# Patient Record
Sex: Female | Born: 1975 | Race: White | Hispanic: No | Marital: Married | Smoking: Never smoker
Health system: Southern US, Community
[De-identification: ages and names within clinical notes are randomized; demographics above are authoritative.]

## PROBLEM LIST (undated history)

## (undated) DIAGNOSIS — I10 Essential (primary) hypertension: Secondary | ICD-10-CM

## (undated) DIAGNOSIS — J349 Unspecified disorder of nose and nasal sinuses: Secondary | ICD-10-CM

## (undated) DIAGNOSIS — R51 Headache: Secondary | ICD-10-CM

## (undated) DIAGNOSIS — IMO0002 Reserved for concepts with insufficient information to code with codable children: Secondary | ICD-10-CM

## (undated) DIAGNOSIS — D649 Anemia, unspecified: Secondary | ICD-10-CM

## (undated) DIAGNOSIS — N12 Tubulo-interstitial nephritis, not specified as acute or chronic: Secondary | ICD-10-CM

## (undated) DIAGNOSIS — R87619 Unspecified abnormal cytological findings in specimens from cervix uteri: Secondary | ICD-10-CM

## (undated) DIAGNOSIS — K219 Gastro-esophageal reflux disease without esophagitis: Secondary | ICD-10-CM

## (undated) DIAGNOSIS — J45909 Unspecified asthma, uncomplicated: Secondary | ICD-10-CM

## (undated) DIAGNOSIS — F319 Bipolar disorder, unspecified: Secondary | ICD-10-CM

## (undated) DIAGNOSIS — E079 Disorder of thyroid, unspecified: Secondary | ICD-10-CM

## (undated) HISTORY — DX: Tubulo-interstitial nephritis, not specified as acute or chronic: N12

## (undated) HISTORY — PX: WISDOM TOOTH EXTRACTION: SHX21

## (undated) HISTORY — DX: Disorder of thyroid, unspecified: E07.9

## (undated) HISTORY — PX: COLPOSCOPY: SHX161

## (undated) HISTORY — DX: Unspecified asthma, uncomplicated: J45.909

## (undated) HISTORY — DX: Gastro-esophageal reflux disease without esophagitis: K21.9

## (undated) HISTORY — DX: Essential (primary) hypertension: I10

## (undated) HISTORY — PX: KNEE ARTHROSCOPY: SHX127

## (undated) SURGERY — Surgical Case
Anesthesia: *Unknown

---

## 1997-12-30 ENCOUNTER — Other Ambulatory Visit: Admission: RE | Admit: 1997-12-30 | Discharge: 1997-12-30 | Payer: Self-pay | Admitting: *Deleted

## 1998-12-22 ENCOUNTER — Other Ambulatory Visit: Admission: RE | Admit: 1998-12-22 | Discharge: 1998-12-22 | Payer: Self-pay | Admitting: *Deleted

## 1999-12-26 ENCOUNTER — Other Ambulatory Visit: Admission: RE | Admit: 1999-12-26 | Discharge: 1999-12-26 | Payer: Self-pay | Admitting: *Deleted

## 2000-01-02 ENCOUNTER — Encounter: Payer: Self-pay | Admitting: *Deleted

## 2000-01-02 ENCOUNTER — Encounter: Admission: RE | Admit: 2000-01-02 | Discharge: 2000-01-02 | Payer: Self-pay | Admitting: *Deleted

## 2001-03-16 ENCOUNTER — Other Ambulatory Visit: Admission: RE | Admit: 2001-03-16 | Discharge: 2001-03-16 | Payer: Self-pay | Admitting: Obstetrics and Gynecology

## 2002-03-26 ENCOUNTER — Other Ambulatory Visit: Admission: RE | Admit: 2002-03-26 | Discharge: 2002-03-26 | Payer: Self-pay | Admitting: Obstetrics and Gynecology

## 2002-04-23 ENCOUNTER — Other Ambulatory Visit: Admission: RE | Admit: 2002-04-23 | Discharge: 2002-04-23 | Payer: Self-pay | Admitting: Obstetrics and Gynecology

## 2003-04-04 ENCOUNTER — Other Ambulatory Visit: Admission: RE | Admit: 2003-04-04 | Discharge: 2003-04-04 | Payer: Self-pay | Admitting: Obstetrics and Gynecology

## 2004-09-26 ENCOUNTER — Ambulatory Visit: Payer: Self-pay | Admitting: Internal Medicine

## 2006-06-17 ENCOUNTER — Ambulatory Visit: Payer: Self-pay | Admitting: Internal Medicine

## 2006-07-08 DIAGNOSIS — N12 Tubulo-interstitial nephritis, not specified as acute or chronic: Secondary | ICD-10-CM

## 2006-07-08 HISTORY — DX: Tubulo-interstitial nephritis, not specified as acute or chronic: N12

## 2007-03-29 ENCOUNTER — Inpatient Hospital Stay (HOSPITAL_COMMUNITY): Admission: EM | Admit: 2007-03-29 | Discharge: 2007-03-31 | Payer: Self-pay | Admitting: Emergency Medicine

## 2007-09-21 ENCOUNTER — Encounter: Admission: RE | Admit: 2007-09-21 | Discharge: 2007-09-21 | Payer: Self-pay | Admitting: Internal Medicine

## 2007-12-02 ENCOUNTER — Ambulatory Visit (HOSPITAL_COMMUNITY): Admission: RE | Admit: 2007-12-02 | Discharge: 2007-12-02 | Payer: Self-pay | Admitting: Obstetrics

## 2008-01-21 ENCOUNTER — Ambulatory Visit: Payer: Self-pay | Admitting: Cardiovascular Disease

## 2008-01-22 ENCOUNTER — Ambulatory Visit (HOSPITAL_COMMUNITY): Admission: RE | Admit: 2008-01-22 | Discharge: 2008-01-22 | Payer: Self-pay | Admitting: Obstetrics

## 2008-02-11 ENCOUNTER — Ambulatory Visit (HOSPITAL_COMMUNITY): Admission: RE | Admit: 2008-02-11 | Discharge: 2008-02-11 | Payer: Self-pay | Admitting: Obstetrics

## 2008-02-22 ENCOUNTER — Ambulatory Visit (HOSPITAL_COMMUNITY): Admission: RE | Admit: 2008-02-22 | Discharge: 2008-02-22 | Payer: Self-pay | Admitting: Obstetrics

## 2008-03-18 ENCOUNTER — Ambulatory Visit: Payer: Self-pay | Admitting: Internal Medicine

## 2008-03-21 ENCOUNTER — Ambulatory Visit (HOSPITAL_COMMUNITY): Admission: RE | Admit: 2008-03-21 | Discharge: 2008-03-21 | Payer: Self-pay | Admitting: Obstetrics

## 2008-04-18 ENCOUNTER — Ambulatory Visit (HOSPITAL_COMMUNITY): Admission: RE | Admit: 2008-04-18 | Discharge: 2008-04-18 | Payer: Self-pay | Admitting: Obstetrics

## 2008-05-11 ENCOUNTER — Ambulatory Visit: Payer: Self-pay | Admitting: Internal Medicine

## 2008-05-16 ENCOUNTER — Ambulatory Visit (HOSPITAL_COMMUNITY): Admission: RE | Admit: 2008-05-16 | Discharge: 2008-05-16 | Payer: Self-pay | Admitting: Obstetrics

## 2008-06-08 ENCOUNTER — Inpatient Hospital Stay (HOSPITAL_COMMUNITY): Admission: AD | Admit: 2008-06-08 | Discharge: 2008-06-11 | Payer: Self-pay | Admitting: Obstetrics

## 2008-06-08 ENCOUNTER — Ambulatory Visit: Payer: Self-pay | Admitting: Cardiology

## 2008-06-08 ENCOUNTER — Encounter (INDEPENDENT_AMBULATORY_CARE_PROVIDER_SITE_OTHER): Payer: Self-pay | Admitting: Obstetrics and Gynecology

## 2008-06-30 ENCOUNTER — Ambulatory Visit: Payer: Self-pay | Admitting: Internal Medicine

## 2008-08-17 ENCOUNTER — Encounter (INDEPENDENT_AMBULATORY_CARE_PROVIDER_SITE_OTHER): Payer: Self-pay | Admitting: *Deleted

## 2008-08-17 ENCOUNTER — Ambulatory Visit: Payer: Self-pay | Admitting: Family Medicine

## 2008-10-03 ENCOUNTER — Ambulatory Visit: Payer: Self-pay | Admitting: Family Medicine

## 2008-10-03 DIAGNOSIS — D485 Neoplasm of uncertain behavior of skin: Secondary | ICD-10-CM | POA: Insufficient documentation

## 2008-10-05 ENCOUNTER — Encounter (INDEPENDENT_AMBULATORY_CARE_PROVIDER_SITE_OTHER): Payer: Self-pay | Admitting: *Deleted

## 2008-10-05 LAB — CONVERTED CEMR LAB
ALT: 12 units/L (ref 0–35)
AST: 19 units/L (ref 0–37)
Albumin: 4.1 g/dL (ref 3.5–5.2)
Alkaline Phosphatase: 68 units/L (ref 39–117)
BUN: 10 mg/dL (ref 6–23)
Basophils Absolute: 0 10*3/uL (ref 0.0–0.1)
Basophils Relative: 0.1 % (ref 0.0–3.0)
Bilirubin, Direct: 0 mg/dL (ref 0.0–0.3)
CO2: 32 meq/L (ref 19–32)
Calcium: 9.3 mg/dL (ref 8.4–10.5)
Chloride: 106 meq/L (ref 96–112)
Cholesterol: 156 mg/dL (ref 0–200)
Creatinine, Ser: 0.7 mg/dL (ref 0.4–1.2)
Eosinophils Absolute: 0.1 10*3/uL (ref 0.0–0.7)
Eosinophils Relative: 2.3 % (ref 0.0–5.0)
GFR calc non Af Amer: 103.4 mL/min (ref >60)
Glucose, Bld: 81 mg/dL (ref 70–99)
HCT: 39.2 % (ref 36.0–46.0)
HDL: 59.2 mg/dL (ref >39.00)
Hemoglobin: 13.8 g/dL (ref 12.0–15.0)
LDL Cholesterol: 88 mg/dL (ref 0–99)
Lymphocytes Relative: 32.1 % (ref 12.0–46.0)
Lymphs Abs: 1.8 10*3/uL (ref 0.7–4.0)
MCHC: 35.1 g/dL (ref 30.0–36.0)
MCV: 90.2 fL (ref 78.0–100.0)
Monocytes Absolute: 0.5 10*3/uL (ref 0.1–1.0)
Monocytes Relative: 9 % (ref 3.0–12.0)
Neutro Abs: 3.1 10*3/uL (ref 1.4–7.7)
Neutrophils Relative %: 56.5 % (ref 43.0–77.0)
Platelets: 198 10*3/uL (ref 150.0–400.0)
Potassium: 4.1 meq/L (ref 3.5–5.1)
RBC: 4.35 M/uL (ref 3.87–5.11)
RDW: 12.5 % (ref 11.5–14.6)
Sodium: 142 meq/L (ref 135–145)
TSH: 1.12 microintl units/mL (ref 0.35–5.50)
Total Bilirubin: 0.5 mg/dL (ref 0.3–1.2)
Total CHOL/HDL Ratio: 3
Total Protein: 7 g/dL (ref 6.0–8.3)
Triglycerides: 42 mg/dL (ref 0.0–149.0)
VLDL: 8.4 mg/dL (ref 0.0–40.0)
WBC: 5.5 10*3/uL (ref 4.5–10.5)

## 2008-11-16 ENCOUNTER — Ambulatory Visit: Payer: Self-pay | Admitting: Family Medicine

## 2008-11-16 LAB — CONVERTED CEMR LAB: Rapid Strep: NEGATIVE

## 2009-04-20 ENCOUNTER — Ambulatory Visit: Payer: Self-pay | Admitting: Family Medicine

## 2009-05-26 ENCOUNTER — Ambulatory Visit: Payer: Self-pay | Admitting: Internal Medicine

## 2009-06-17 ENCOUNTER — Emergency Department (HOSPITAL_BASED_OUTPATIENT_CLINIC_OR_DEPARTMENT_OTHER): Admission: EM | Admit: 2009-06-17 | Discharge: 2009-06-17 | Payer: Self-pay | Admitting: Emergency Medicine

## 2009-07-21 ENCOUNTER — Ambulatory Visit: Payer: Self-pay | Admitting: Internal Medicine

## 2009-08-30 ENCOUNTER — Telehealth: Payer: Self-pay | Admitting: Internal Medicine

## 2009-09-28 ENCOUNTER — Ambulatory Visit: Payer: Self-pay | Admitting: Family Medicine

## 2009-09-28 ENCOUNTER — Telehealth: Payer: Self-pay | Admitting: Internal Medicine

## 2009-10-31 ENCOUNTER — Encounter: Payer: Self-pay | Admitting: Internal Medicine

## 2009-11-10 ENCOUNTER — Telehealth (INDEPENDENT_AMBULATORY_CARE_PROVIDER_SITE_OTHER): Payer: Self-pay | Admitting: *Deleted

## 2009-11-20 ENCOUNTER — Encounter: Payer: Self-pay | Admitting: Internal Medicine

## 2009-11-21 ENCOUNTER — Encounter: Payer: Self-pay | Admitting: Internal Medicine

## 2009-12-01 ENCOUNTER — Encounter: Payer: Self-pay | Admitting: Internal Medicine

## 2009-12-21 ENCOUNTER — Encounter: Payer: Self-pay | Admitting: Internal Medicine

## 2009-12-26 ENCOUNTER — Ambulatory Visit: Payer: Self-pay | Admitting: Family Medicine

## 2009-12-26 DIAGNOSIS — M461 Sacroiliitis, not elsewhere classified: Secondary | ICD-10-CM | POA: Insufficient documentation

## 2010-01-23 ENCOUNTER — Encounter (INDEPENDENT_AMBULATORY_CARE_PROVIDER_SITE_OTHER): Payer: Self-pay | Admitting: *Deleted

## 2010-02-09 ENCOUNTER — Telehealth (INDEPENDENT_AMBULATORY_CARE_PROVIDER_SITE_OTHER): Payer: Self-pay | Admitting: *Deleted

## 2010-02-09 ENCOUNTER — Ambulatory Visit: Payer: Self-pay | Admitting: Family Medicine

## 2010-02-09 ENCOUNTER — Encounter: Admission: RE | Admit: 2010-02-09 | Discharge: 2010-02-09 | Payer: Self-pay | Admitting: Family Medicine

## 2010-03-02 ENCOUNTER — Ambulatory Visit: Payer: Self-pay | Admitting: Family Medicine

## 2010-03-02 DIAGNOSIS — M545 Low back pain, unspecified: Secondary | ICD-10-CM | POA: Insufficient documentation

## 2010-03-19 ENCOUNTER — Encounter
Admission: RE | Admit: 2010-03-19 | Discharge: 2010-06-17 | Payer: Self-pay | Source: Home / Self Care | Attending: Family Medicine | Admitting: Family Medicine

## 2010-03-21 ENCOUNTER — Emergency Department (HOSPITAL_BASED_OUTPATIENT_CLINIC_OR_DEPARTMENT_OTHER): Admission: EM | Admit: 2010-03-21 | Discharge: 2010-03-21 | Payer: Self-pay | Admitting: Emergency Medicine

## 2010-03-21 ENCOUNTER — Ambulatory Visit: Payer: Self-pay | Admitting: Diagnostic Radiology

## 2010-03-29 ENCOUNTER — Encounter: Payer: Self-pay | Admitting: Internal Medicine

## 2010-04-02 ENCOUNTER — Ambulatory Visit: Payer: Self-pay | Admitting: Family Medicine

## 2010-05-07 ENCOUNTER — Ambulatory Visit: Payer: Self-pay | Admitting: Family Medicine

## 2010-05-07 DIAGNOSIS — R42 Dizziness and giddiness: Secondary | ICD-10-CM | POA: Insufficient documentation

## 2010-05-14 ENCOUNTER — Ambulatory Visit: Payer: Self-pay | Admitting: Family Medicine

## 2010-05-14 LAB — CONVERTED CEMR LAB
Bilirubin Urine: NEGATIVE
Glucose, Urine, Semiquant: NEGATIVE
Ketones, urine, test strip: NEGATIVE
Nitrite: POSITIVE
Protein, U semiquant: NEGATIVE
Specific Gravity, Urine: 1.02
Urobilinogen, UA: 0.2
pH: 6.5

## 2010-05-15 ENCOUNTER — Telehealth (INDEPENDENT_AMBULATORY_CARE_PROVIDER_SITE_OTHER): Payer: Self-pay | Admitting: *Deleted

## 2010-05-15 LAB — CONVERTED CEMR LAB: hCG, Beta Chain, Quant, S: 0.35 milliintl units/mL

## 2010-05-18 ENCOUNTER — Telehealth: Payer: Self-pay | Admitting: Family Medicine

## 2010-06-05 ENCOUNTER — Encounter: Payer: Self-pay | Admitting: Sports Medicine

## 2010-06-05 ENCOUNTER — Encounter: Payer: Self-pay | Admitting: Family Medicine

## 2010-06-09 ENCOUNTER — Emergency Department (HOSPITAL_BASED_OUTPATIENT_CLINIC_OR_DEPARTMENT_OTHER): Admission: EM | Admit: 2010-06-09 | Discharge: 2010-06-09 | Payer: Self-pay | Admitting: Emergency Medicine

## 2010-06-18 ENCOUNTER — Ambulatory Visit: Payer: Self-pay | Admitting: Family Medicine

## 2010-06-25 ENCOUNTER — Ambulatory Visit: Payer: Self-pay | Admitting: Family Medicine

## 2010-06-25 ENCOUNTER — Ambulatory Visit (HOSPITAL_BASED_OUTPATIENT_CLINIC_OR_DEPARTMENT_OTHER)
Admission: RE | Admit: 2010-06-25 | Discharge: 2010-06-25 | Payer: Self-pay | Source: Home / Self Care | Attending: Family Medicine | Admitting: Family Medicine

## 2010-06-25 ENCOUNTER — Encounter: Payer: Self-pay | Admitting: Family Medicine

## 2010-06-25 DIAGNOSIS — IMO0001 Reserved for inherently not codable concepts without codable children: Secondary | ICD-10-CM | POA: Insufficient documentation

## 2010-06-25 LAB — CONVERTED CEMR LAB
Anti Nuclear Antibody(ANA): NEGATIVE
Basophils Absolute: 0 10*3/uL (ref 0.0–0.1)
Basophils Relative: 1 % (ref 0.0–3.0)
Eosinophils Absolute: 0.1 10*3/uL (ref 0.0–0.7)
Eosinophils Relative: 2.3 % (ref 0.0–5.0)
HCT: 40.4 % (ref 36.0–46.0)
Hemoglobin: 13.8 g/dL (ref 12.0–15.0)
Lymphocytes Relative: 35.5 % (ref 12.0–46.0)
Lymphs Abs: 1.8 10*3/uL (ref 0.7–4.0)
MCHC: 34.2 g/dL (ref 30.0–36.0)
MCV: 91.2 fL (ref 78.0–100.0)
Monocytes Absolute: 0.5 10*3/uL (ref 0.1–1.0)
Monocytes Relative: 10.5 % (ref 3.0–12.0)
Neutro Abs: 2.5 10*3/uL (ref 1.4–7.7)
Neutrophils Relative %: 50.7 % (ref 43.0–77.0)
Platelets: 208 10*3/uL (ref 150.0–400.0)
RBC: 4.44 M/uL (ref 3.87–5.11)
RDW: 13 % (ref 11.5–14.6)
Sed Rate: 9 mm/hr (ref 0–22)
TSH: 1.17 microintl units/mL (ref 0.35–5.50)
WBC: 5 10*3/uL (ref 4.5–10.5)

## 2010-07-10 ENCOUNTER — Ambulatory Visit
Admission: RE | Admit: 2010-07-10 | Discharge: 2010-07-10 | Payer: Self-pay | Source: Home / Self Care | Attending: Family Medicine | Admitting: Family Medicine

## 2010-07-10 ENCOUNTER — Encounter: Payer: Self-pay | Admitting: Family Medicine

## 2010-07-16 ENCOUNTER — Ambulatory Visit
Admission: RE | Admit: 2010-07-16 | Discharge: 2010-07-16 | Payer: Self-pay | Source: Home / Self Care | Attending: Family Medicine | Admitting: Family Medicine

## 2010-07-16 DIAGNOSIS — H6690 Otitis media, unspecified, unspecified ear: Secondary | ICD-10-CM | POA: Insufficient documentation

## 2010-07-16 DIAGNOSIS — J309 Allergic rhinitis, unspecified: Secondary | ICD-10-CM | POA: Insufficient documentation

## 2010-07-29 ENCOUNTER — Encounter: Payer: Self-pay | Admitting: Obstetrics

## 2010-08-03 ENCOUNTER — Ambulatory Visit
Admission: RE | Admit: 2010-08-03 | Discharge: 2010-08-03 | Payer: Self-pay | Source: Home / Self Care | Attending: Family Medicine | Admitting: Family Medicine

## 2010-08-09 NOTE — Miscellaneous (Signed)
Summary: Orders Update   Clinical Lists Changes  Problems: Added new problem of EPICONDYLITIS (ICD-726.32) Orders: Added new Referral order of Orthopedic Referral (Ortho) - Signed

## 2010-08-09 NOTE — Letter (Signed)
Summary: [facility]   Imported By: Roderic Ovens 11/20/2009 13:54:24  _____________________________________________________________________  External Attachment:    Type:   Image     Comment:   External Document

## 2010-08-09 NOTE — Assessment & Plan Note (Signed)
Summary: bladder infection/kn  Flu Vaccine Consent Questions     Do you have a history of severe allergic reactions to this vaccine? no    Any prior history of allergic reactions to egg and/or gelatin? no    Do you have a sensitivity to the preservative Thimersol? no    Do you have a past history of Guillan-Barre Syndrome? no    Do you currently have an acute febrile illness? no    Have you ever had a severe reaction to latex? no    Vaccine information given and explained to patient? yes    Are you currently pregnant? no    Lot Number:[id number]   Exp Date:01/05/2011   Site Given  Left Deltoid IM    Vital Signs:  Patient profile:   35 year old female Weight:      141 pounds Pulse rate:   96 / minute BP sitting:   110 / 60  (left arm)  Vitals Entered By: Doristine Devoid CMA (May 14, 2010 11:05 AM) CC: UTI sx started over the weekend frequency and dysuria   History of Present Illness: 35 yo woman here today for dysuria, urgency, hesitancy.  sxs started this AM.  no fevers.  no back pain.  hx of recurrent UTI.  nausea- started end of Sept.  could only eat crackers and ginger-ale.  has Mirena so no regular cycles.  denies weight gain.  has taken 3 home pregnancy tests which were (-) but read online that IUD would invalidate urine tests.  is wondering if there is a chance she is actually pregnant, asked about blood test.  Current Medications (verified): 1)  Nadolol 20 Mg Tabs (Nadolol) .... Take One and One Half Tablet Daily- 30mg  Total 2)  Flonase 50 Mcg/act Susp (Fluticasone Propionate) .... As Needed 3)  Cephalexin 500 Mg  Tabs (Cephalexin) .... Take One By Mouth 2 Times Daily X5 Days 4)  Diflucan 150 Mg Tabs (Fluconazole) .... Once Daily.  May Repeat in 3 Days If Sxs Persist 5)  Antivert 25 Mg Tabs (Meclizine Hcl) .Marland Kitchen.. 1 Tab By Mouth 3-4x/day As Needed For Dizziness 6)  Promethazine Hcl 25 Mg  Tabs (Promethazine Hcl) .Marland Kitchen.. 1 Tab By Mouth Q6 As Needed For Nausea  Allergies  (verified): No Known Drug Allergies  Review of Systems      See HPI  Physical Exam  General:  Well-developed,well-nourished,in no acute distress; alert,appropriate and cooperative throughout examination Abdomen:  Bowel sounds positive,abdomen soft and non-tender.  no CVA or suprapubic tenderness   Impression & Recommendations:  Problem # 1:  UTI (ICD-599.0) Assessment New UA and sxs consistent w/ infxn.  start Keflex. Her updated medication list for this problem includes:    Cephalexin 500 Mg Tabs (Cephalexin) .Marland Kitchen... Take one by mouth 2 times daily x5 days  Orders: Specimen Handling (42595) T-Culture, Urine (63875-64332) UA Dipstick w/o Micro (manual) (81002)  Problem # 2:  NAUSEA (ICD-787.02) Assessment: New uncertain why IUD could invalidate Upreg tests but will draw blood to determine w/ certainty whether pt is pregnant. Her updated medication list for this problem includes:    Antivert 25 Mg Tabs (Meclizine hcl) .Marland Kitchen... 1 tab by mouth 3-4x/day as needed for dizziness    Promethazine Hcl 25 Mg Tabs (Promethazine hcl) .Marland Kitchen... 1 tab by mouth q6 as needed for nausea  Orders: Venipuncture (95188) TLB-Preg Serum Quant (B-hCG) (84702-HCG-QN) Specimen Handling (41660)  Complete Medication List: 1)  Nadolol 20 Mg Tabs (Nadolol) .... Take one and one  half tablet daily- 30mg  total 2)  Flonase 50 Mcg/act Susp (Fluticasone propionate) .... As needed 3)  Cephalexin 500 Mg Tabs (Cephalexin) .... Take one by mouth 2 times daily x5 days 4)  Diflucan 150 Mg Tabs (Fluconazole) .... Once daily.  may repeat in 3 days if sxs persist 5)  Antivert 25 Mg Tabs (Meclizine hcl) .Marland Kitchen.. 1 tab by mouth 3-4x/day as needed for dizziness 6)  Promethazine Hcl 25 Mg Tabs (Promethazine hcl) .Marland Kitchen.. 1 tab by mouth q6 as needed for nausea  Other Orders: Admin 1st Vaccine (11914) Flu Vaccine 20yrs + (78295)  Patient Instructions: 1)  We'll notify you of your lab results 2)  Drink plenty of water 3)  Stop the  Amoxicillin 4)  Start the Cephalexin for the bladder infection 5)  Take AZO available over the counter 6)  Call with any questions or concerns 7)  Hang in there!! Prescriptions: CEPHALEXIN 500 MG  TABS (CEPHALEXIN) take one by mouth 2 times daily x5 days  #10 x 0   Entered and Authorized by:   Neena Rhymes MD   Signed by:   Neena Rhymes MD on 05/14/2010   Method used:   Electronically to        [facility] # [id number]* (retail)       [address] [place], [place]  [address]       Ph: [phone]       Fax: [phone]   RxID:   [id number]    Orders Added: 1)  Specimen Handling [99000] 2)  T-Culture, Urine [[id number]] 3)  UA Dipstick w/o Micro (manual) [81002] 4)  Admin 1st Vaccine [90471] 5)  Flu Vaccine 52yrs + [90658] 6)  Venipuncture [95188] 7)  TLB-Preg Serum Quant (B-hCG) [84702-HCG-QN] 8)  Specimen Handling [99000] 9)  Est. Patient Level III [41660]    Laboratory Results   Urine Tests    Routine Urinalysis   Glucose: negative   (Normal Range: Negative) Bilirubin: negative   (Normal Range: Negative) Ketone: negative   (Normal Range: Negative) Spec. Gravity: 1.020   (Normal Range: 1.003-1.035) Blood: large   (Normal Range: Negative) pH: 6.5   (Normal Range: 5.0-8.0) Protein: negative   (Normal Range: Negative) Urobilinogen: 0.2   (Normal Range: 0-1) Nitrite: positive   (Normal Range: Negative) Leukocyte Esterace: moderate   (Normal Range: Negative)

## 2010-08-09 NOTE — Letter (Signed)
Summary: Records Dated 07-14-06 thru 09-16-07/[facility]  Records Dated 07-14-06 thru 09-16-07/[facility]   Imported By: Lanelle Bal 11/27/2009 08:49:54  _____________________________________________________________________  External Attachment:    Type:   Image     Comment:   External Document

## 2010-08-09 NOTE — Assessment & Plan Note (Signed)
Summary: vertigo/cbs   Vital Signs:  Patient profile:   35 year old female Height:      63 inches Weight:      141 pounds BMI:     25.07 Pulse rate:   96 / minute BP sitting:   100 / 60  (right arm)  Vitals Entered By: Doristine Devoid CMA (May 07, 2010 11:07 AM) CC: vertigo sx started last night some nausea very off balance   History of Present Illness: 35 yo woman here today w/ vertigo.  got up at 4 am today w/ daughter and 'could hardly make it back to bed'.  some facial pain and pressure, bloody nasal drainage on Sunday.  no fevers.  feels as if she is spinning.  no sxs when lying still but 'even the smallest movement' causes sxs.  + nausea.  no vomiting.  sxs are worst w/ turning head.  no focal weakness or numbness.  Current Medications (verified): 1)  Nadolol 20 Mg Tabs (Nadolol) .... Take One and One Half Tablet Daily- 30mg  Total 2)  Flonase 50 Mcg/act Susp (Fluticasone Propionate) .... As Needed  Allergies (verified): No Known Drug Allergies  Past History:  Past Medical History: pyelonephritis 2008  Review of Systems      See HPI  Physical Exam  General:  obviously uncomfortable, lying on exam table Head:  + TTP over frontal sinuses, no pain over maxillary sinuses Eyes:  no injxn or inflammation, PERRL, EOMI Ears:  External ear exam shows no significant lesions or deformities.  Otoscopic examination reveals clear canals, tympanic membranes are intact bilaterally without bulging, retraction, inflammation or discharge. Hearing is grossly normal bilaterally. Nose:  External nasal examination shows no deformity or inflammation. Nasal mucosa are pink and moist without lesions or exudates. Mouth:  Oral mucosa and oropharynx without lesions or exudates.  Teeth in good repair. Neck:  No deformities, masses, or tenderness noted. Lungs:  Normal respiratory effort, chest expands symmetrically. Lungs are clear to auscultation, no crackles or wheezes. Heart:  normal rate and  no murmur.   Neurologic:  alert & oriented X3, cranial nerves II-XII intact, strength normal in all extremities, sensation intact to light touch, and DTRs symmetrical and normal.     Impression & Recommendations:  Problem # 1:  VERTIGO (ICD-780.4) Assessment New pt's sxs consistent w/ BPPV.  neuro exam w/out focal deficit.  start antivert and phenergan as needed.  reviewed supportive care and red flags that should prompt return.  Pt expresses understanding and is in agreement w/ this plan. Her updated medication list for this problem includes:    Antivert 25 Mg Tabs (Meclizine hcl) .Marland Kitchen... 1 tab by mouth 3-4x/day as needed for dizziness    Promethazine Hcl 25 Mg Tabs (Promethazine hcl) .Marland Kitchen... 1 tab by mouth q6 as needed for nausea  Problem # 2:  SINUSITIS- ACUTE-NOS (ICD-461.9) Assessment: Unchanged start amox.  reviewed supportive care and red flags that should prompt return.  Pt expresses understanding and is in agreement w/ this plan. Her updated medication list for this problem includes:    Flonase 50 Mcg/act Susp (Fluticasone propionate) .Marland Kitchen... As needed    Amoxicillin 500 Mg Tabs (Amoxicillin) .Marland Kitchen... 2 tabs by mouth two times a day x10 days.  take w/ food.  Complete Medication List: 1)  Nadolol 20 Mg Tabs (Nadolol) .... Take one and one half tablet daily- 30mg  total 2)  Flonase 50 Mcg/act Susp (Fluticasone propionate) .... As needed 3)  Amoxicillin 500 Mg Tabs (Amoxicillin) .... 2  tabs by mouth two times a day x10 days.  take w/ food. 4)  Diflucan 150 Mg Tabs (Fluconazole) .... Once daily.  may repeat in 3 days if sxs persist 5)  Antivert 25 Mg Tabs (Meclizine hcl) .Marland Kitchen.. 1 tab by mouth 3-4x/day as needed for dizziness 6)  Promethazine Hcl 25 Mg Tabs (Promethazine hcl) .Marland Kitchen.. 1 tab by mouth q6 as needed for nausea  Patient Instructions: 1)  Follow up as needed 2)  Take the Amoxicillin for your sinus infxn- take w/ food to avoid upset stomach 3)  Use the Antivert as needed for  dizziness 4)  Take the promethazine as needed for nausea 5)  Drink plenty of fluids 6)  If you have any weakness, numbness, or other concerns- please call or go to the ER 7)  Hang in there!! Prescriptions: PROMETHAZINE HCL 25 MG  TABS (PROMETHAZINE HCL) 1 tab by mouth q6 as needed for nausea  #30 x 0   Entered and Authorized by:   Neena Rhymes MD   Signed by:   Neena Rhymes MD on 05/07/2010   Method used:   Electronically to        [facility] # [id number]* (retail)       [address] [place], [place]  [address]       Ph: [phone]       Fax: [phone]   RxID:   [id number] ANTIVERT 25 MG TABS (MECLIZINE HCL) 1 tab by mouth 3-4x/day as needed for dizziness  #45 x 0   Entered and Authorized by:   Neena Rhymes MD   Signed by:   Neena Rhymes MD on 05/07/2010   Method used:   Electronically to        [facility] # [phone]* (retail)       [address] [place], [place]  [address]       Ph: [phone]       Fax: [phone]   RxID:   [id number] DIFLUCAN 150 MG TABS (FLUCONAZOLE) once daily.  may repeat in 3 days if sxs persist  #2 x 1   Entered and Authorized by:   Neena Rhymes MD   Signed by:   Neena Rhymes MD on 05/07/2010   Method used:   Electronically to        [facility] # [phone]* (retail)       [address] [place], [place]  [address]       Ph: [phone]       Fax: [phone]   RxID:   [id number] AMOXICILLIN 500 MG TABS (AMOXICILLIN) 2 tabs by mouth two times a day x10 days.  take w/ food.  #40 x 0   Entered and Authorized by:   Neena Rhymes MD   Signed by:   Neena Rhymes MD on 05/07/2010   Method used:   Electronically to        [facility] # [phone]* (retail)       [address] [place], [place]  [address]       Ph: [phone]       Fax: [phone]  RxID:    [id number]    Orders Added: 1)  Est. Patient Level III [14782]

## 2010-08-09 NOTE — Assessment & Plan Note (Signed)
Summary: F/U,[facility]   Vital Signs:  Patient profile:   35 year old female BP sitting:   118 / 78  Vitals Entered By: Lillia Pauls CMA (March 02, 2010 8:52 AM)  Primary Care Provider:  Neena Rhymes MD   History of Present Illness: 1) coccyx pain about 50% better. Avoiding activities that worsen it, has bought a padded bike seat etc. Took naproxen as directed--no stiomach problems.  2) Low back pain is not any better. Symptoms unchanged no new symptoms Pain with certain activities, worsened with a lot of standing. No weakness in legs, no change bowel or bladder habits. No radiation.  3) info about her heart condition--followed [facility]. tapering off beta blockers and feeling OK. She says they may actually "remoove" the diagnosis as it is not really a firm conclusion. Did have brither die SCD early age.  Current Medications (verified): 1)  Nadolol 20 Mg Tabs (Nadolol) .... Take One and One Half Tablet Daily- 30mg  Total 2)  Flonase 50 Mcg/act Susp (Fluticasone Propionate) .... As Needed 3)  Naprosyn 500 Mg Tabs (Naproxen) .Marland Kitchen.. 1 Two Times A Day   Take W/ Food.  Allergies: No Known Drug Allergies  Review of Systems       Please see HPI for additional ROS.   Physical Exam  General:  alert, well-developed, well-nourished, and well-hydrated.     Detailed Back/Spine Exam  Lumbosacral Exam:  Inspection-deformity:    Normal Palpation-spinal tenderness:     mildy TTP para lumbar muscles, esp PSIS area . No SI joint or sciatic notch tenderness  Range of Motion:    Forward Flexion:   80 degrees    Hyperextension:   35 degrees Squatting:  normal Lying Straight Leg Raise:    Right:  negative    Left:  negative Sitting Straight Leg Raise:    Right:  negative    Left:  negative Fabere Test:    Right:  negative    Left:  negative     Some pain with full flexion at hips---feels tightening of lumbar muscles. Poor hamstring flexibility.  GAIT is normal. LE strength  5/5 B   Impression & Recommendations:  Problem # 1:  SACROILIITIS (ICD-720.2) coccodynia--50% better with LSM and NSAIDS. REc continue NSAIDs this dose three more weeks and then stop. If not pretty well resolved then rtc for consideration of steroid injection  Problem # 2:  LOW BACK PAIN SYNDROME (ICD-724.2)  Needs low back rehab--will send to PT for eval Tx with emphasis on getting HEP rtc as needed if this does not improve or new symptoms. She is in agreement with this plan.  Problem # 3:  LONG QT SYNDROME (ICD-759.89) being followed at [facility] clinic--they are tapering her off beta blockers.  Complete Medication List: 1)  Nadolol 20 Mg Tabs (Nadolol) .... Take one and one half tablet daily- 30mg  total 2)  Flonase 50 Mcg/act Susp (Fluticasone propionate) .... As needed 3)  Naprosyn 500 Mg Tabs (Naproxen) .Marland Kitchen.. 1 two times a day   take w/ food.

## 2010-08-09 NOTE — Progress Notes (Signed)
Summary: SHOT RECORDS  Phone Note Call from Patient   Summary of Call: PATIENT CALL WANTING TO GET A COPY OF HER IMMUNIZATION SHOT. SHE NEEDS THE ONE FOR HER LAST TB SHOT. IF POSSIBLE/PLEASE CALL HER AT [phone] Initial call taken by: Barnie Mort,  Nov 10, 2009 1:21 PM  Follow-up for Phone Call        spoke w/ patient aware still haven't recieved records from previous physician patient states she will contact their office for information but still refaxed release of records for our information.....Marland KitchenMarland KitchenDoristine Devoid  Nov 10, 2009 4:39 PM

## 2010-08-09 NOTE — Procedures (Signed)
Summary: [facility] Holter Report  [facility]   Imported By: Roderic Ovens 12/21/2009 12:53:09  _____________________________________________________________________  External Attachment:    Type:   Image     Comment:   External Document

## 2010-08-09 NOTE — Letter (Signed)
Summary: [facility] PT referral form  [facility] PT referral form   Imported By: Marily Memos 03/02/2010 11:07:48  _____________________________________________________________________  External Attachment:    Type:   Image     Comment:   External Document

## 2010-08-09 NOTE — Assessment & Plan Note (Signed)
Summary: F1Y  Medications Added NADOLOL 20 MG TABS (NADOLOL) take 3 tablets once daily      Allergies Added: NKDA  Primary Provider:  Neena Rhymes MD  CC:  follow up 1 year.  Pt has noticed some intermittent pressure in her chest.  Different times of day and during different activities.  History of Present Illness: Ms. Darrick Penna is seen in follow upf long QT syndrome  a diagnosis based on the death of her brother and borderline abnormal electrocardiograms.  This is derived from evaluation by Dr. Inocencio Homes further and confirmed by the doctors at [facility]. Follwoing delivery of her child she was set up to see  Dr. Ebony Hail with whom I discussed her peripartum care prior to the delivery.  Specifically, the question was can her diagnosis be confirmed or not.  I should note that her familion screen was negative.   Given the complexities of the postpartum life, she hasnot  gone to [facility]  She still plans to do so She is tired, taking her nadolol and being a new mom .  She has had no syncope  Current Medications (verified): 1)  Nadolol 20 Mg Tabs (Nadolol) .... Take 3 Tablets Once Daily 2)  Flonase 50 Mcg/act Susp (Fluticasone Propionate) .Marland Kitchen.. 1 Spray Each Nostril Every Morning  Allergies (verified): No Known Drug Allergies  Past History:  Past Medical History: Last updated: 10/03/2008 Long QT syndrome- [email or web address] for meds to avoid pyelonephritis 2008  Past Surgical History: Last updated: 08/17/2008 Caesarean section Right knee Arthroscopy  Family History: Last updated: 07/20/2009 CAD-maternal grandfather MI age 27 HTN-no DM-maternal grandfather STROKE-no COLON CA- no BREAST CA-no Brother-Sudden cardiac death  Social History: Last updated: 08/17/2008 married daughter, Spero Geralds 12/09 step-son  Vital Signs:  Patient profile:   35 year old female Height:      63.25 inches Weight:      136 pounds BMI:     23.99 Pulse rate:   62 / minute Pulse rhythm:    irregular BP sitting:   116 / 76  (left arm) Cuff size:   regular  Vitals Entered By: Judithe Modest CMA (July 21, 2009 8:55 AM)  Physical Exam  General:  The patient was alert and oriented in no acute distress. HEENT Normal.  Neck veins were flat, carotids were brisk.  Lungs were clear.  Heart sounds were regular without murmurs or gallops.  Abdomen was soft with active bowel sounds. There is no clubbing cyanosis or edema. Skin Warm and dry    EKG  Procedure date:  07/21/2009  Findings:      sinus rhythm with pacs and sinus pauses  for an overall rate of about 60 Low folts are noted , r' in lead v1  EKG  Procedure date:  07/21/2009  Findings:      repeat ECG was normal, the previous a limb lead issue  Impression & Recommendations:  Problem # 1:  LONG QT SYNDROME (ICD-759.89)  Orders: EKG w/ Interpretation (93000) Prescriptions: NADOLOL 20 MG TABS (NADOLOL) take 3 tablets once daily  #90 x 3   Entered by:   Judithe Modest CMA   Authorized by:   Nathen May, MD, [facility]   Signed by:   Judithe Modest CMA on 07/21/2009   Method used:   Electronically to        [facility] # [phone]* (retail)       [address]       [place]  [place], [place]  [address]       Ph: [phone]       Fax: [phone]   RxID:   [id number]

## 2010-08-09 NOTE — Consult Note (Signed)
Summary: [facility] Consultation Report  [facility] Consult[facility]   Imported By: Roderic Ovens 12/21/2009 12:50:53  _____________________________________________________________________  External Attachment:    Type:   Image     Comment:   External Document

## 2010-08-09 NOTE — Assessment & Plan Note (Signed)
Summary: cough/congestion/kdc   Vital Signs:  Patient profile:   35 year old female Height:      63.25 inches Weight:      142.6 pounds BMI:     25.15 Temp:     98.1 degrees F Pulse rate:   64 / minute BP sitting:   110 / 70  Vitals Entered By: R.Peeler CC: cough, congestion, fatigue, husband has bronchitis, tussinex and nyquill did not help, Cough   History of Present Illness:  Cough      This is a 35 year old woman who presents with Cough.  The symptoms began 1 week ago.  Pt has used tussonex with no relief.  The patient reports malaise, but denies non-productive cough, pleuritic chest pain, shortness of breath, wheezing, exertional dyspnea, fever, and hemoptysis.  Associated symtpoms include nasal congestion.  The patient denies the following symptoms: cold/URI symptoms, sore throat, chronic rhinitis, weight loss, acid reflux symptoms, and peripheral edema.  The cough is worse with activity and lying down.  Ineffective prior treatments have included OTC cough medication.  Risk factors include recurrent sinus infections.    Current Medications (verified): 1)  Nadolol 20 Mg Tabs (Nadolol) .... Take 3 Tablets Once Daily 2)  Flonase 50 Mcg/act Susp (Fluticasone Propionate) .... As Needed  Allergies (verified): No Known Drug Allergies  Past History:  Past medical, surgical, family and social histories (including risk factors) reviewed for relevance to current acute and chronic problems.  Past Medical History: Reviewed history from 10/03/2008 and no changes required. Long QT syndrome- [email or web address] for meds to avoid pyelonephritis 2008  Past Surgical History: Reviewed history from 08/17/2008 and no changes required. Caesarean section Right knee Arthroscopy  Family History: Reviewed history from 07/20/2009 and no changes required. CAD-maternal grandfather MI age 22 HTN-no DM-maternal grandfather STROKE-no COLON CA- no BREAST CA-no Brother-Sudden cardiac death  Social  History: Reviewed history from 08/17/2008 and no changes required. married daughter, Spero Geralds 12/09 step-son  Review of Systems      See HPI  Physical Exam  General:  Well-developed,well-nourished,in no acute distress; alert,appropriate and cooperative throughout examination Ears:  External ear exam shows no significant lesions or deformities.  Otoscopic examination reveals clear canals, tympanic membranes are intact bilaterally without bulging, retraction, inflammation or discharge. Hearing is grossly normal bilaterally. Nose:  External nasal examination shows no deformity or inflammation. Nasal mucosa are pink and moist without lesions or exudates. Mouth:  Oral mucosa and oropharynx without lesions or exudates.  Teeth in good repair. Neck:  No deformities, masses, or tenderness noted. Lungs:  R decreased breath sounds and L decreased breath sounds.   Heart:  normal rate and no murmur.     Impression & Recommendations:  Problem # 1:  BRONCHITIS- ACUTE (ICD-466.0)  Her updated medication list for this problem includes:    Augmentin 875-125 Mg Tabs (Amoxicillin-pot clavulanate) .Marland Kitchen... 1 by mouth two times a day  Take antibiotics and other medications as directed. Encouraged to push clear liquids, get enough rest, and take acetaminophen as needed. To be seen in 5-7 days if no improvement, sooner if worse.  Complete Medication List: 1)  Nadolol 20 Mg Tabs (Nadolol) .... Take 3 tablets once daily 2)  Flonase 50 Mcg/act Susp (Fluticasone propionate) .... As needed 3)  Augmentin 875-125 Mg Tabs (Amoxicillin-pot clavulanate) .Marland Kitchen.. 1 by mouth two times a day Prescriptions: AUGMENTIN 875-125 MG TABS (AMOXICILLIN-POT CLAVULANATE) 1 by mouth two times a day  #20 x 0   Entered and Authorized by:  Loreen Freud DO   Signed by:   Loreen Freud DO on 09/28/2009   Method used:   Electronically to        [facility] # [phone]* (retail)       [address] [place], [place]  [address]       Ph: [phone]       Fax: [phone]   RxID:   [id number]

## 2010-08-09 NOTE — Progress Notes (Signed)
Summary: set up mail order - rx   Phone Note Call from Patient Call back at Home Phone [phone] Call back at Work Phone [phone]   Caller: Patient Reason for Call: Talk to Nurse Details for Reason: Per pt calling back, was told to call back with new insurance, so pt can set up mail order rx. Initial call taken by: Lorne Skeens,  August 30, 2009 11:28 AM  Follow-up for Phone Call        Pt's questions answered. Copies of this years information sent to her for f/u at [facility]. Follow-up by: Duncan Dull, RN, BSN,  August 30, 2009 2:00 PM

## 2010-08-09 NOTE — Assessment & Plan Note (Signed)
Summary: FOR SINUS INFECTION//PH   Vital Signs:  Patient profile:   35 year old female Weight:      142 pounds BMI:     25.25 Temp:     98.1 degrees F oral BP sitting:   110 / 80  (left arm)  Vitals Entered By: Doristine Devoid CMA (July 16, 2010 1:55 PM) CC: sinus infection and congstion along w/ HA   History of Present Illness: 35 yo woman here today for ? sinus infxn.  had infxn last month and took Avelox.  not using Flonase.  not taking allergy medications.  sxs started Saturday night.  no fevers/chills.  + facial pain and pressure.  + nasal congestion.  using Netti pot w/ some relief.  + ear pain bilaterally.  mild cough.  + sick contacts.  Current Medications (verified): 1)  Nadolol 20 Mg Tabs (Nadolol) .... Take One and One Half Tablet Daily- 30mg  Total 2)  Flonase 50 Mcg/act Susp (Fluticasone Propionate) .... As Needed 3)  Amoxicillin 500 Mg Tabs (Amoxicillin) .... 2 Tab By Mouth Two Times A Day X10 Days  Allergies (verified): No Known Drug Allergies  Review of Systems      See HPI  Physical Exam  General:  Well-developed,well-nourished,in no acute distress; alert,appropriate and cooperative throughout examination Head:  + TTP over frontal and maxillary sinuses Eyes:  no injxn or inflammation Ears:  R TM WNL L TM w/ yellow, cloudy fluid behind TM Nose:  + congestion and turbinate edema Mouth:  Oral mucosa and oropharynx without lesions or exudates.  Teeth in good repair. Neck:  No deformities, masses, or tenderness noted. Lungs:  Normal respiratory effort, chest expands symmetrically. Lungs are clear to auscultation, no crackles or wheezes. Heart:  normal rate and no murmur.     Impression & Recommendations:  Problem # 1:  SINUSITIS - ACUTE-NOS (ICD-461.9) Assessment New  The following medications were removed from the medication list:    Tessalon 200 Mg Caps (Benzonatate) .Marland Kitchen... Take one capsule by mouth three times a day as needed for cough    Cheratussin Ac  100-10 Mg/70ml Syrp (Guaifenesin-codeine) .Marland Kitchen... 1-2 tsps q4-6 as needed for cough.  disp 150 ml Her updated medication list for this problem includes:    Flonase 50 Mcg/act Susp (Fluticasone propionate) .Marland Kitchen... As needed    Amoxicillin 500 Mg Tabs (Amoxicillin) .Marland Kitchen... 2 tab by mouth two times a day x10 days  Problem # 2:  RHINITIS (ICD-477.9) Assessment: New pt's recurrent sinus infxns likely due to untreated allergies.  restart nasal steroid spray and antihistamine. Her updated medication list for this problem includes:    Flonase 50 Mcg/act Susp (Fluticasone propionate) .Marland Kitchen... As needed  Problem # 3:  UNSPECIFIED OTITIS MEDIA (ICD-382.9) Assessment: New L ear infxn.  start Amox. Her updated medication list for this problem includes:    Amoxicillin 500 Mg Tabs (Amoxicillin) .Marland Kitchen... 2 tab by mouth two times a day x10 days  Complete Medication List: 1)  Nadolol 20 Mg Tabs (Nadolol) .... Take one and one half tablet daily- 30mg  total 2)  Flonase 50 Mcg/act Susp (Fluticasone propionate) .... As needed 3)  Amoxicillin 500 Mg Tabs (Amoxicillin) .... 2 tab by mouth two times a day x10 days  Patient Instructions: 1)  Take the Amoxicillin for the ears and the sinuses- take w/ food to avoid upset stomach 2)  Start Zyrtec daily (store brand is just as good) 3)  Use the Flonase daily to prevent allergy inflammation 4)  Drink  plenty of fluids 5)  Tylenol/Ibuprofen as needed for pain 6)  Hang in there!!! Prescriptions: FLONASE 50 MCG/ACT SUSP (FLUTICASONE PROPIONATE) as needed  #1 x 3   Entered and Authorized by:   Neena Rhymes MD   Signed by:   Neena Rhymes MD on 07/16/2010   Method used:   Electronically to        [facility] # [phone]* (retail)       [address] [place], [place]  [address]       Ph: [phone]       Fax: [phone]   RxID:   [id number] AMOXICILLIN 500 MG TABS (AMOXICILLIN) 2 tab by mouth two times a day x10 days  #40 x 0    Entered and Authorized by:   Neena Rhymes MD   Signed by:   Neena Rhymes MD on 07/16/2010   Method used:   Electronically to        [facility] # [phone]* (retail)       [address] [place], [place]  [address]       Ph: [phone]       Fax: [phone]   RxID:   [id number]    Orders Added: 1)  Est. Patient Level III [[address]]

## 2010-08-09 NOTE — Letter (Signed)
Summary: [facility] Final Diagnosis Note   [facility] Final Diagnosis Note   Imported By: Roderic Ovens 05/10/2010 11:34:44  _____________________________________________________________________  External Attachment:    Type:   Image     Comment:   External Document

## 2010-08-09 NOTE — Progress Notes (Signed)
Summary: lab  Phone Note Outgoing Call   Call placed by: Doristine Devoid CMA,  May 15, 2010 11:30 AM Call placed to: Patient Summary of Call: not pregnant.  please call pt  Follow-up for Phone Call        left message on machine .........Marland KitchenDoristine Devoid CMA  May 15, 2010 11:30 AM   Additional Follow-up for Phone Call Additional follow up Details #1::        discuss with patient......Marland KitchenFelecia Deloach CMA  May 15, 2010 11:58 AM

## 2010-08-09 NOTE — Progress Notes (Signed)
   Phone Note Outgoing Call   Summary of Call: Kelsey [name] plz call her and tell her the x rays were normal. Work hard on the exercises and i will see her back as planned Thanks!  Denny Levy MD  February 09, 2010 12:39 PM   Follow-up for Phone Call        left message on [phone] Follow-up by: Lillia Pauls CMA,  February 09, 2010 2:34 PM

## 2010-08-09 NOTE — Assessment & Plan Note (Signed)
Summary: COUGH/KN   Vital Signs:  Patient profile:   35 year old female Weight:      138 pounds Temp:     98.2 degrees F oral BP sitting:   108 / 64  (left arm)  Vitals Entered By: Doristine Devoid CMA (June 18, 2010 1:49 PM) CC: cough and chest congestion along w/ sinus HA x2 weeks    History of Present Illness: 35 yo woman here today for cough and congestion.  sxs started 2 weeks ago.  went to ER last Saturday due to dehydration 1 week ago.  was told it was a virus.  biggest problem now is fatigue, cough, and headache.  taking Aleve w/out relief.  also using Mucinex.  + facial pain, tooth pain.  no ear pain.  cough is intermittantly productive.  had CXR and labs done in ER- CBC showed upper normal WBC but elevated neutrophils and decreased lymphs.  Current Medications (verified): 1)  Nadolol 20 Mg Tabs (Nadolol) .... Take One and One Half Tablet Daily- 30mg  Total 2)  Flonase 50 Mcg/act Susp (Fluticasone Propionate) .... As Needed  Allergies (verified): No Known Drug Allergies  Review of Systems      See HPI  Physical Exam  General:  obviously uncomfortable Head:  + TTP over frontal and maxillary sinuses Eyes:  no injxn or inflammation Ears:  External ear exam shows no significant lesions or deformities.  Otoscopic examination reveals clear canals, tympanic membranes are intact bilaterally without bulging, retraction, inflammation or discharge. Hearing is grossly normal bilaterally. Nose:  External nasal examination shows no deformity or inflammation. Nasal mucosa are pink and moist without lesions or exudates. Mouth:  Oral mucosa and oropharynx without lesions or exudates.  Teeth in good repair. Neck:  No deformities, masses, or tenderness noted. Lungs:  Normal respiratory effort, chest expands symmetrically. Lungs are clear to auscultation, no crackles or wheezes. Heart:  normal rate and no murmur.     Impression & Recommendations:  Problem # 1:  SINUSITIS, ACUTE  (ICD-461.9) Assessment New based on increased neutrophils infxn is likely bacterial.  start avelox.  reviewed supportive care and red flags that should prompt return.  Pt expresses understanding and is in agreement w/ this plan. The following medications were removed from the medication list:    Cipro 500 Mg Tabs (Ciprofloxacin hcl) .Marland Kitchen... Take 1 tab two times a day x3 days Her updated medication list for this problem includes:    Flonase 50 Mcg/act Susp (Fluticasone propionate) .Marland Kitchen... As needed    Avelox 400 Mg Tabs (Moxifloxacin hcl) .Marland Kitchen... 1 tablet by mouth daily    Tessalon 200 Mg Caps (Benzonatate) .Marland Kitchen... Take one capsule by mouth three times a day as needed for cough    Cheratussin Ac 100-10 Mg/15ml Syrp (Guaifenesin-codeine) .Marland Kitchen... 1-2 tsps q4-6 as needed for cough.  disp 150 ml  Complete Medication List: 1)  Nadolol 20 Mg Tabs (Nadolol) .... Take one and one half tablet daily- 30mg  total 2)  Flonase 50 Mcg/act Susp (Fluticasone propionate) .... As needed 3)  Avelox 400 Mg Tabs (Moxifloxacin hcl) .Marland Kitchen.. 1 tablet by mouth daily 4)  Tessalon 200 Mg Caps (Benzonatate) .... Take one capsule by mouth three times a day as needed for cough 5)  Cheratussin Ac 100-10 Mg/60ml Syrp (Guaifenesin-codeine) .Marland Kitchen.. 1-2 tsps q4-6 as needed for cough.  disp 150 ml  Patient Instructions: 1)  This is a sinus infection 2)  Take the Avelox daily as directed 3)  Drink plenty of fluids 4)  Use the Tessalon for daytime cough 5)  Use the codeine syrup at night for cough- will cause drowsiness 6)  Use the ProAir inhaler- 2 puffs as needed for cough/wheezing 7)  REST! 8)  Call with any questions or concerns 9)  Hang in there!!! Prescriptions: CHERATUSSIN AC 100-10 MG/5ML SYRP (GUAIFENESIN-CODEINE) 1-2 tsps q4-6 as needed for cough.  disp 150 ml  #156ml x 0   Entered and Authorized by:   Neena Rhymes MD   Signed by:   Neena Rhymes MD on 06/18/2010   Method used:   Print then Give to Patient   RxID:    [id number] TESSALON 200 MG CAPS (BENZONATATE) Take one capsule by mouth three times a day as needed for cough  #60 x 0   Entered and Authorized by:   Neena Rhymes MD   Signed by:   Neena Rhymes MD on 06/18/2010   Method used:   Electronically to        [facility] # [phone]* (retail)       [address] [place], [place]  [address]       Ph: [phone]       Fax: [phone]   RxID:   [id number] AVELOX 400 MG  TABS (MOXIFLOXACIN HCL) 1 tablet by mouth daily  #10 x 0   Entered and Authorized by:   Neena Rhymes MD   Signed by:   Neena Rhymes MD on 06/18/2010   Method used:   Electronically to        [facility] # [phone]* (retail)       [address] [place], [place]  [address]       Ph: [phone]       Fax: [phone]   RxID:   [id number]    Orders Added: 1)  Est. Patient Level III [10626]

## 2010-08-09 NOTE — Assessment & Plan Note (Signed)
Summary: PRESSURE IN BOTH EARS/RH......   Vital Signs:  Patient profile:   35 year old female Weight:      143.8 pounds Temp:     98.5 degrees F oral BP sitting:   110 / 70  (left arm) Cuff size:   regular  Vitals Entered By: Almeta Monas CMA Duncan Dull) (August 03, 2010 11:52 AM) CC: c/o URI x4days--recently treated but symptoms came back   Current Medications (verified): 1)  Flonase 50 Mcg/act Susp (Fluticasone Propionate) .... As Needed 2)  Zyrtec Allergy 10 Mg Tabs (Cetirizine Hcl) .... By Mouth Once Daily  Allergies (verified): No Known Drug Allergies  Past History:  Past Medical History: Last updated: 05/07/2010 pyelonephritis 2008  Past Surgical History: Last updated: 08/17/2008 Caesarean section Right knee Arthroscopy  Family History: Last updated: 07/20/2009 CAD-maternal grandfather MI age 68 HTN-no DM-maternal grandfather STROKE-no COLON CA- no BREAST CA-no Brother-Sudden cardiac death  Social History: Last updated: 08/17/2008 married daughter, Spero Geralds 12/09 step-son  Risk Factors: Smoking Status: never (08/17/2008)  Family History: Reviewed history from 07/20/2009 and no changes required. CAD-maternal grandfather MI age 28 HTN-no DM-maternal grandfather STROKE-no COLON CA- no BREAST CA-no Brother-Sudden cardiac death  Social History: Reviewed history from 08/17/2008 and no changes required. married daughter, Spero Geralds 12/09 step-son  Review of Systems      See HPI  Physical Exam  General:  Well-developed,well-nourished,in no acute distress; alert,appropriate and cooperative throughout examination Ears:  External ear exam shows no significant lesions or deformities.  Otoscopic examination reveals clear canals, tympanic membranes are intact bilaterally without bulging, retraction, inflammation or discharge. Hearing is grossly normal bilaterally. Nose:  L frontal sinus tenderness, L maxillary sinus tenderness, R frontal sinus  tenderness, and R maxillary sinus tenderness.   Mouth:  Oral mucosa and oropharynx without lesions or exudates.  Teeth in good repair. Neck:  No deformities, masses, or tenderness noted. Lungs:  Normal respiratory effort, chest expands symmetrically. Lungs are clear to auscultation, no crackles or wheezes. Heart:  normal rate and no murmur.   Extremities:  No clubbing, cyanosis, edema, or deformity noted with normal full range of motion of all joints.   Skin:  Intact without suspicious lesions or rashes Cervical Nodes:  No lymphadenopathy noted Psych:  Oriented X3 and normally interactive.     Impression & Recommendations:  Problem # 1:  SINUSITIS - ACUTE-NOS (ICD-461.9)  Her updated medication list for this problem includes:    Flonase 50 Mcg/act Susp (Fluticasone propionate) .Marland Kitchen... As needed    Avelox 400 Mg Tabs (Moxifloxacin hcl) .Marland Kitchen... 1 by mouth once daily    Astepro 0.15 % Soln (Azelastine hcl) .Marland Kitchen... 2 spray each nostril once daily  Orders: Allergy Referral  (Allergy)  Instructed on treatment. Call if symptoms persist or worsen.   Problem # 2:  RHINITIS (ICD-477.9)  Her updated medication list for this problem includes:    Flonase 50 Mcg/act Susp (Fluticasone propionate) .Marland Kitchen... As needed    Zyrtec Allergy 10 Mg Tabs (Cetirizine hcl) ..... By mouth once daily    Astepro 0.15 % Soln (Azelastine hcl) .Marland Kitchen... 2 spray each nostril once daily  Discussed use of allergy medications and environmental measures.   Complete Medication List: 1)  Flonase 50 Mcg/act Susp (Fluticasone propionate) .... As needed 2)  Zyrtec Allergy 10 Mg Tabs (Cetirizine hcl) .... By mouth once daily 3)  Avelox 400 Mg Tabs (Moxifloxacin hcl) .Marland Kitchen.. 1 by mouth once daily 4)  Astepro 0.15 % Soln (Azelastine hcl) .... 2 spray each  nostril once daily 5)  Fluconazole 150 Mg Tabs (Fluconazole) .Marland Kitchen.. 1 by mouth once daily x1,  may repeat in 3 days as needed Prescriptions: FLUCONAZOLE 150 MG TABS (FLUCONAZOLE) 1 by  mouth once daily x1,  may repeat in 3 days as needed  #2 x 2   Entered and Authorized by:   Loreen Freud DO   Signed by:   Loreen Freud DO on 08/03/2010   Method used:   Electronically to        [facility] # [phone]* (retail)       [address] [place], [place]  [address]       Ph: [phone]       Fax: [phone]   RxID:   [id number] AVELOX 400 MG TABS (MOXIFLOXACIN HCL) 1 by mouth once daily  #10 x 0   Entered and Authorized by:   Loreen Freud DO   Signed by:   Loreen Freud DO on 08/03/2010   Method used:   Electronically to        [facility] # [phone]* (retail)       [address] [place], [place]  [address]       Ph: [phone]       Fax: [phone]   RxID:   [id number]    Orders Added: 1)  Allergy Referral  [Allergy] 2)  Est. Patient Level III [32355]
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
## 2010-08-09 NOTE — Progress Notes (Signed)
Summary: RX  Phone Note Call from Patient Call back at Work Phone [phone] Call back at [phone]   Caller: Patient Summary of Call: pt is traveling out of town and left her medicine for her uti at home. Please fax to [facility] [address]  [place] [phone] Initial call taken by: Lavell Islam,  May 18, 2010 1:19 PM  Follow-up for Phone Call        Pls advise ok to resend cipro.........Marland KitchenFelecia Deloach CMA  May 18, 2010 4:35 PM   Additional Follow-up for Phone Call Additional follow up Details #1::        this is fine Additional Follow-up by: Neena Rhymes MD,  May 18, 2010 4:39 PM    Additional Follow-up for Phone Call Additional follow up Details #2::    faxed to [facility] [address]  [place] @[phone]...................Marland KitchenFelecia Deloach CMA  May 18, 2010 4:47 PM   Prescriptions: CIPRO 500 MG TABS (CIPROFLOXACIN HCL) Take 1 tab two times a day x3 days  #6 x 0   Entered by:   Jeremy Johann CMA   Authorized by:   Neena Rhymes MD   Signed by:   Jeremy Johann CMA on 05/18/2010   Method used:   Printed then faxed to ...       [facility] [address] [phone]* (retail)       [address], [place]  [address]       Ph: [phone]       Fax: [phone]   RxID:   [id number]

## 2010-08-09 NOTE — Assessment & Plan Note (Signed)
Summary: MUSCLE ACHES,? REACTION TO AVELOX/RH.......Marland Kitchen   Vital Signs:  Patient profile:   35 year old female Weight:      138 pounds BMI:     24.53 Pulse rate:   109 / minute BP sitting:   110 / 72  (left arm)  Vitals Entered By: Doristine Devoid CMA (June 25, 2010 11:10 AM) CC: muscle and joint pain in both hands and some in legs worse at night    History of Present Illness: 35 yo woman here today for myalgias.  on friday started having thoracic back pain.  used heating pad w/ some pain relief.  then started having bilateral arm pain w/ weakness, tingling in fingers, and sensation of arm heaviness.  also having leg pain, mostly on L- described as 'like a charly horse'.  denies tick exposure, no rashes.  also having associated fatigue but has recently been ill.  hasn't tried any OTC meds for pain relief.  mom on disability for fibromyalgia  Current Medications (verified): 1)  Nadolol 20 Mg Tabs (Nadolol) .... Take One and One Half Tablet Daily- 30mg  Total 2)  Flonase 50 Mcg/act Susp (Fluticasone Propionate) .... As Needed 3)  Avelox 400 Mg  Tabs (Moxifloxacin Hcl) .Marland Kitchen.. 1 Tablet By Mouth Daily 4)  Tessalon 200 Mg Caps (Benzonatate) .... Take One Capsule By Mouth Three Times A Day As Needed For Cough 5)  Cheratussin Ac 100-10 Mg/37ml Syrp (Guaifenesin-Codeine) .Marland Kitchen.. 1-2 Tsps Q4-6 As Needed For Cough.  Disp 150 Ml  Allergies (verified): No Known Drug Allergies  Review of Systems      See HPI  Physical Exam  General:  Well-developed,well-nourished,in no acute distress; alert,appropriate and cooperative throughout examination Msk:  + TTP over multiple trigger points on neck, back, upper arms, knees, shins.  full ROM of joints throughout Pulses:  +2 carotid, radial, DP Extremities:  no edema or erythema (-) Homan's + pain over L posterior calf Neurologic:  strength normal in all extremities, sensation intact to light touch, gait normal, and DTRs symmetrical and normal.      Impression & Recommendations:  Problem # 1:  LEG PAIN, LEFT (ICD-729.5) Assessment New pt at low risk for DVT but does have family hx.  given pain is isolated in L calf will get dopper to r/o clot. Orders: Doppler Referral (Doppler)  Problem # 2:  MYALGIA (ICD-729.1) Assessment: New pt's pain is widespread and difficult to characterize.  + family hx of fibromyalgia.  check labs to r/o RA, lupus, lyme dz, etc.  discussed that if labs are normal we may have to consider dx of fibro.  pt aware and understands. Orders: Venipuncture (16109) TLB-CBC Platelet - w/Differential (85025-CBCD) TLB-Sedimentation Rate (ESR) (85652-ESR) T-Antinuclear Antib (ANA) [phone]) T-Lyme Disease [phone]) TLB-TSH (Thyroid Stimulating Hormone) (84443-TSH) Specimen Handling (13086)  Complete Medication List: 1)  Nadolol 20 Mg Tabs (Nadolol) .... Take one and one half tablet daily- 30mg  total 2)  Flonase 50 Mcg/act Susp (Fluticasone propionate) .... As needed 3)  Avelox 400 Mg Tabs (Moxifloxacin hcl) .Marland Kitchen.. 1 tablet by mouth daily 4)  Tessalon 200 Mg Caps (Benzonatate) .... Take one capsule by mouth three times a day as needed for cough 5)  Cheratussin Ac 100-10 Mg/22ml Syrp (Guaifenesin-codeine) .Marland Kitchen.. 1-2 tsps q4-6 as needed for cough.  disp 150 ml  Patient Instructions: 1)  We'll notify you of your lab results 2)  Heat, tylenol or ibuprofen as needed for pain 3)  We'll notify you of your ultrasound results 4)  If all  the labs come back normal we can consider fibromyalgia- we can talk about treatment options at that time 5)  Try and hang in there! 6)  Happy Holidays!   Orders Added: 1)  Venipuncture [36415] 2)  TLB-CBC Platelet - w/Differential [85025-CBCD] 3)  TLB-Sedimentation Rate (ESR) [85652-ESR] 4)  T-Antinuclear Antib (ANA) [04540-98119] 5)  T-Lyme Disease [14782-95621] 6)  TLB-TSH (Thyroid Stimulating Hormone) [84443-TSH] 7)  Doppler Referral [Doppler] 8)  Specimen Handling  [99000] 9)  Est. Patient Level III [30865]

## 2010-08-09 NOTE — Assessment & Plan Note (Signed)
Summary: 8:30 APPT - NP,LOWER BACK PAIN,MC   Vital Signs:  Patient profile:   35 year old female Height:      63 inches Weight:      135 pounds BP sitting:   113 / 75  Vitals Entered By: Lillia Pauls CMA (February 09, 2010 8:40 AM)  Primary Care Provider:  Neena Rhymes MD   History of Present Illness: 35 yo F new patient referred by Dr. Beverely Low. Here for low back pain. Started 2 months ago when while sitting had onset of lower back locking up when trying to stand up. 2 days later noticed pain in tailbone when sitting on couch.  Noticed again last night sitting on soft surface. When on her feet pain is more intermittent, but constant when sitting. States that her tailbone hurts the worst. NKI. Notices occasional "warmth" going down top of thighs when sitting on hard surfaces. More nagging pain, can still do daily activities. Does bother her somewhat when working out. Normal bowel/bladder problems.  No blood in stool. No F/S/C/wt loss. Has tried heating pads and NSAIDs with some relief.  Also tried donut pad.  Problems Prior to Update: 1)  Epicondylitis  (ICD-726.32) 2)  Sacroiliitis  (ICD-720.2) 3)  Neoplasm of Uncertain Behavior of Skin  (ICD-238.2) 4)  Knee Pain, Right  (ICD-719.46) 5)  Healthy Adult Female  (ICD-V70.0) 6)  Sinusitis- Acute-nos  (ICD-461.9) 7)  Pharyngitis-acute  (ICD-462) 8)  Long Qt Syndrome  (ICD-759.89)  Medications Prior to Update: 1)  Nadolol 20 Mg Tabs (Nadolol) .... Take One and One Half Tablet Daily- 30mg  Total 2)  Flonase 50 Mcg/act Susp (Fluticasone Propionate) .... As Needed 3)  Naprosyn 500 Mg Tabs (Naproxen) .Marland Kitchen.. 1 Two Times A Day X10 Days and Then As Needed.  Take W/ Food.  Current Medications (verified): 1)  Nadolol 20 Mg Tabs (Nadolol) .... Take One and One Half Tablet Daily- 30mg  Total 2)  Flonase 50 Mcg/act Susp (Fluticasone Propionate) .... As Needed 3)  Naprosyn 500 Mg Tabs (Naproxen) .Marland Kitchen.. 1 Two Times A Day   Take W/  Food.  Allergies (verified): No Known Drug Allergies  Past History:  Past Medical History: Last updated: 10/03/2008 Long QT syndrome- [email or web address] for meds to avoid pyelonephritis 2008  Past Surgical History: Last updated: 08/17/2008 Caesarean section Right knee Arthroscopy  Family History: Last updated: 07/20/2009 CAD-maternal grandfather MI age 58 HTN-no DM-maternal grandfather STROKE-no COLON CA- no BREAST CA-no Brother-Sudden cardiac death  Social History: Last updated: 08/17/2008 married daughter, Spero Geralds 12/09 step-son  Risk Factors: Smoking Status: never (08/17/2008)  Family History: Reviewed history from 07/20/2009 and no changes required. CAD-maternal grandfather MI age 66 HTN-no DM-maternal grandfather STROKE-no COLON CA- no BREAST CA-no Brother-Sudden cardiac death  Social History: Reviewed history from 08/17/2008 and no changes required. married daughter, Spero Geralds 12/09 step-son  Review of Systems  The patient denies anorexia, fever, weight loss, weight gain, peripheral edema, and difficulty walking.         Please see HPI for additional ROS.   Physical Exam  General:  Well-developed,well-nourished,in no acute distress; alert,appropriate and cooperative throughout examination Msk:  No deformity or scoliosis noted of thoracic or lumbar spine.  normal ROM, no redness over joints, no muscle atrophy, + SI joint tenderness.     Detailed Back/Spine Exam  Gait:    Normal heel-toe gait pattern bilaterally.    Lumbosacral Exam:  Inspection-deformity:    Normal Palpation-spinal tenderness:  Abnormal    SI joints and mildly  on coccyx Range of Motion:    Forward Flexion:   90 degrees    Hyperextension:   35 degrees    Right Lateral Bend:   35 degrees    Left Lateral Bend:   35 degrees Lying Straight Leg Raise:    Right:  negative    Left:  negative Sitting Straight Leg Raise:    Right:  negative    Left:  negative Toe  Walking:    Right:  normal    Left:  normal Heel Walking:    Right:  normal    Left:  normal Fabere Test:    Right:  negative    Left:  positive     On gait, has some hindfoot valgus and external rotation of both feet with some overpronation and arch breakdown.  No pelvic tilt.   Impression & Recommendations:  Problem # 1:  SACROILIITIS (ICD-720.2)  Orders: Radiology other (Radiology Other) some component of coccydynia--no inciting trauma Low back program given in HO form--restart naprosyn for next 3 weeks, plain film pelvis and coccyx, rtc 3 w. Consider injection at that  time if no improvement. No indication of rheumatological process.  Complete Medication List: 1)  Nadolol 20 Mg Tabs (Nadolol) .... Take one and one half tablet daily- 30mg  total 2)  Flonase 50 Mcg/act Susp (Fluticasone propionate) .... As needed 3)  Naprosyn 500 Mg Tabs (Naproxen) .Marland Kitchen.. 1 two times a day   take w/ food. Prescriptions: NAPROSYN 500 MG TABS (NAPROXEN) 1 two times a day   take w/ food.  #60 x 1   Entered and Authorized by:   Denny Levy MD   Signed by:   Denny Levy MD on 02/09/2010   Method used:   Electronically to        [facility] # [phone]* (retail)       [address] [place], [place]  [address]       Ph: [phone]       Fax: [phone]   RxID:   [id number]

## 2010-08-09 NOTE — Progress Notes (Signed)
Summary: MED QUESTION REFILL   Phone Note Call from Patient Call back at Work Phone [phone]   Caller: Patient Reason for Call: Talk to Nurse Summary of Call: CALLING BACK ABOUT MED REFILL, MAIL ORDER VS. LOCAL PHARMACY Initial call taken by: Migdalia Dk,  September 28, 2009 3:23 PM  Follow-up for Phone Call        pt wanted 90 day supply sent to [facility], rx sent Meredith Staggers, RN  September 28, 2009 3:33 PM     Prescriptions: NADOLOL 20 MG TABS (NADOLOL) take 3 tablets once daily  #270 x 3   Entered by:   Meredith Staggers, RN   Authorized by:   Nathen May, MD, [facility]   Signed by:   Meredith Staggers, RN on 09/28/2009   Method used:   Electronically to        [facility] # [phone]* (retail)       [address] [place], [place]  [address]       Ph: [phone]       Fax: [phone]   RxID:   [id number]

## 2010-08-09 NOTE — Miscellaneous (Signed)
Summary: [facility]  [facility]   Imported By: Marily Memos 07/13/2010 11:51:48  _____________________________________________________________________  External Attachment:    Type:   Image     Comment:   External Document

## 2010-08-09 NOTE — Miscellaneous (Signed)
[facility]: [facility]  [facility]   Imported By: Marily Memos 06/05/2010 15:46:45  _____________________________________________________________________  External Attachment:    Type:   Image     Comment:   External Document

## 2010-08-09 NOTE — Assessment & Plan Note (Signed)
Summary: lower back pain//lch   Vital Signs:  Patient profile:   35 year old female Weight:      138 pounds Pulse rate:   90 / minute BP sitting:   120 / 80  (left arm)  Vitals Entered By: Doristine Devoid (December 26, 2009 11:51 AM) CC: pain at tailbone x1 week    History of Present Illness: 35 yo woman here today for pain in tailbone.  sxs started 1 week ago.  pain will 'shoot up the back'.  denies injury.  going to the gym 4x/week- spinning, wt lifting.  had 1 episode of hemmorhoid pain last week- unrelated.  pain is starting to radiate into the legs bilaterally- into lateral thigh.  worse w/ sitting.  ibuprofen w/ relief.  no numbness or tingling, weakness.  Current Medications (verified): 1)  Nadolol 20 Mg Tabs (Nadolol) .... Take One and One Half Tablet Daily- 30mg  Total 2)  Flonase 50 Mcg/act Susp (Fluticasone Propionate) .... As Needed 3)  Naprosyn 500 Mg Tabs (Naproxen) .Marland Kitchen.. 1 Two Times A Day X10 Days and Then As Needed.  Take W/ Food.  Allergies (verified): No Known Drug Allergies  Past History:  Past Medical History: Last updated: 10/03/2008 Long QT syndrome- [email or web address] for meds to avoid pyelonephritis 2008  Review of Systems      See HPI  Physical Exam  General:  Well-developed,well-nourished,in no acute distress; alert,appropriate and cooperative throughout examination Msk:  + TTP over SI joints bilaterally good forward flexion, pain w/ extension (-) SLR bilaterally full ROM of hips bilaterally  Pulses:  +2 DP/PT Extremities:  no C/C/E Neurologic:  sensation intact to light touch, gait normal, and DTRs symmetrical and normal.     Impression & Recommendations:  Problem # 1:  SACROILIITIS (ICD-720.2) Assessment New pt's TTP over SI joints consistent w/ inflammation.  start NSAIDs.  no red flags on hx or PE.  if no improvement in 2 weeks will refer to sports med.  reviewed supportive care and red flags that should prompt return.  Pt expresses understanding  and is in agreement w/ this plan.  Complete Medication List: 1)  Nadolol 20 Mg Tabs (Nadolol) .... Take one and one half tablet daily- 30mg  total 2)  Flonase 50 Mcg/act Susp (Fluticasone propionate) .... As needed 3)  Naprosyn 500 Mg Tabs (Naproxen) .Marland Kitchen.. 1 two times a day x10 days and then as needed.  take w/ food.  Patient Instructions: 1)  This appears to be inflammation and should improve with time 2)  Take the Naprosyn as directed 3)  Heat before exercise, ice after 4)  If no improvement in pain in the next 10-14 days, please call! 5)  Do gentle stretching to prevent stiffness 6)  Hang in there!! Prescriptions: NAPROSYN 500 MG TABS (NAPROXEN) 1 two times a day x10 days and then as needed.  take w/ food.  #60 x 0   Entered and Authorized by:   Neena Rhymes MD   Signed by:   Neena Rhymes MD on 12/26/2009   Method used:   Electronically to        [facility] # [phone]* (retail)       [address] [place], [place]  [address]       Ph: [phone]       Fax: [phone]   RxID:   [id number]

## 2010-08-29 ENCOUNTER — Encounter: Payer: Self-pay | Admitting: Family Medicine

## 2010-09-07 ENCOUNTER — Other Ambulatory Visit: Payer: Self-pay | Admitting: Obstetrics

## 2010-09-10 ENCOUNTER — Encounter: Payer: Self-pay | Admitting: Family Medicine

## 2010-09-10 ENCOUNTER — Ambulatory Visit (INDEPENDENT_AMBULATORY_CARE_PROVIDER_SITE_OTHER): Payer: BC Managed Care – PPO | Admitting: Family Medicine

## 2010-09-10 DIAGNOSIS — M25569 Pain in unspecified knee: Secondary | ICD-10-CM | POA: Insufficient documentation

## 2010-09-13 ENCOUNTER — Ambulatory Visit: Payer: BC Managed Care – PPO | Admitting: Family Medicine

## 2010-09-13 ENCOUNTER — Ambulatory Visit (INDEPENDENT_AMBULATORY_CARE_PROVIDER_SITE_OTHER): Payer: BC Managed Care – PPO | Admitting: Family Medicine

## 2010-09-13 ENCOUNTER — Encounter: Payer: Self-pay | Admitting: Family Medicine

## 2010-09-13 DIAGNOSIS — M25569 Pain in unspecified knee: Secondary | ICD-10-CM

## 2010-09-18 LAB — RAPID STREP SCREEN (MED CTR MEBANE ONLY): Streptococcus, Group A Screen (Direct): NEGATIVE

## 2010-09-18 LAB — BASIC METABOLIC PANEL
BUN: 9 mg/dL (ref 6–23)
CO2: 24 mEq/L (ref 19–32)
Calcium: 9.3 mg/dL (ref 8.4–10.5)
Chloride: 103 mEq/L (ref 96–112)
Creatinine, Ser: 0.7 mg/dL (ref 0.4–1.2)
GFR calc Af Amer: >60 mL/min (ref >60)
GFR calc non Af Amer: >60 mL/min (ref >60)
Glucose, Bld: 94 mg/dL (ref 70–99)
Potassium: 3.5 mEq/L (ref 3.5–5.1)
Sodium: 141 mEq/L (ref 135–145)

## 2010-09-18 LAB — DIFFERENTIAL
Basophils Absolute: 0.1 10*3/uL (ref 0.0–0.1)
Basophils Relative: 1 % (ref 0–1)
Eosinophils Absolute: 0 10*3/uL (ref 0.0–0.7)
Eosinophils Relative: 0 % (ref 0–5)
Lymphocytes Relative: 5 % — ABNORMAL LOW (ref 12–46)
Lymphs Abs: 0.5 10*3/uL — ABNORMAL LOW (ref 0.7–4.0)
Monocytes Absolute: 0.6 10*3/uL (ref 0.1–1.0)
Monocytes Relative: 7 % (ref 3–12)
Neutro Abs: 7.3 10*3/uL (ref 1.7–7.7)
Neutrophils Relative %: 86 % — ABNORMAL HIGH (ref 43–77)

## 2010-09-18 LAB — URINALYSIS, ROUTINE W REFLEX MICROSCOPIC
Bilirubin Urine: NEGATIVE
Glucose, UA: NEGATIVE mg/dL
Hgb urine dipstick: NEGATIVE
Ketones, ur: >80 mg/dL — AB
Nitrite: NEGATIVE
Protein, ur: NEGATIVE mg/dL
Specific Gravity, Urine: 1.017 (ref 1.005–1.030)
Urobilinogen, UA: 0.2 mg/dL (ref 0.0–1.0)
pH: 6.5 (ref 5.0–8.0)

## 2010-09-18 LAB — CBC
HCT: 40.6 % (ref 36.0–46.0)
Hemoglobin: 13.9 g/dL (ref 12.0–15.0)
MCH: 31 pg (ref 26.0–34.0)
MCHC: 34.3 g/dL (ref 30.0–36.0)
MCV: 90.5 fL (ref 78.0–100.0)
Platelets: 183 10*3/uL (ref 150–400)
RBC: 4.48 MIL/uL (ref 3.87–5.11)
RDW: 12.3 % (ref 11.5–15.5)
WBC: 8.5 10*3/uL (ref 4.0–10.5)

## 2010-09-18 NOTE — Assessment & Plan Note (Signed)
Summary: knee pain/cbs   Vital Signs:  Patient profile:   35 year old female Height:      63 inches (160.02 cm) Weight:      146.38 pounds (66.54 kg) BMI:     26.02 BP sitting:   100 / 60  (left arm) Cuff size:   regular  Vitals Entered By: Lucious Groves CMA (September 10, 2010 1:09 PM) CC: C/O left knee pain--runners knee./kb Is Patient Diabetic? No Pain Assessment Patient in pain? yes     Location: knee Intensity: 3-4 Type: aching Onset of pain  08/17/10 Comments Wants ortho referral.   History of Present Illness: 35 yo woman here today for L knee pain.  ran 1/2 marathon last week.  pain started during the 12 mile week- 3 weeks ago.  has been icing and using Naprosyn daily- this helped.  was able to complete 1/2 marathon.  took last week off.  ran 3 miles today and again had pain.  pain is located under L knee cap laterally and will radiate up into leg.  Current Medications (verified): 1)  Zyrtec Allergy 10 Mg Tabs (Cetirizine Hcl) .... By Mouth Once Daily 2)  Fluconazole 150 Mg Tabs (Fluconazole) .Marland Kitchen.. 1 By Mouth Once Daily X1,  May Repeat in 3 Days As Needed 3)  Patanase 0.6 % Soln (Olopatadine Hcl) .... Two Times A Day 4)  Nasonex 50 Mcg/act Susp (Mometasone Furoate) .... Once Daily  Allergies (verified): No Known Drug Allergies  Review of Systems      See HPI  Physical Exam  General:  Well-developed,well-nourished,in no acute distress; alert,appropriate and cooperative throughout examination Msk:  L knee w/out edema, erythema, induration no pain w/ flexion/extension, internal/external rotation Pulses:  +2 DP/PT   Impression & Recommendations:  Problem # 1:  PATELLO-FEMORAL SYNDROME (ICD-719.46) Assessment New  pt's sxs consistent w/ L patellofemoral syndrome.  continue icing and NSAIDs as needed.  refer to sports med  Orders: Sports Medicine (Sports Med)  Complete Medication List: 1)  Zyrtec Allergy 10 Mg Tabs (Cetirizine hcl) .... By mouth once daily 2)   Fluconazole 150 Mg Tabs (Fluconazole) .Marland Kitchen.. 1 by mouth once daily x1,  may repeat in 3 days as needed 3)  Patanase 0.6 % Soln (Olopatadine hcl) .... Two times a day 4)  Nasonex 50 Mcg/act Susp (Mometasone furoate) .... Once daily  Patient Instructions: 1)  This appears to be patellofemoral syndrome 2)  Take the Naprosyn as needed for pain 3)  Ice after running 4)  We'll call you with your Sports Med Appt 5)  Call with any questions or concerns 6)  Congrats on finishing the race!!!   Orders Added: 1)  Sports Medicine [Sports Med] 2)  Est. Patient Level III [04540]

## 2010-09-18 NOTE — Letter (Signed)
Summary: [facility]  [facility]   Imported By: Maryln Gottron 09/13/2010 09:55:29  _____________________________________________________________________  External Attachment:    Type:   Image     Comment:   External Document

## 2010-09-18 NOTE — Assessment & Plan Note (Signed)
Summary: Kelsey [name]/np/lp  # [id number]   Vital Signs:  Patient profile:   35 year old female Height:      63 inches (160.02 cm) Weight:      145.6 pounds (66.18 kg) BMI:     25.89 Temp:     98.4 degrees F (36.89 degrees C) oral Pulse rate:   99 / minute BP sitting:   124 / 81  (right arm)  Vitals Entered By: Baxter Hire) (September 13, 2010 2:14 PM) CC: patellofemoral pain [name] Pain Assessment Patient in pain? yes     Location: left knee Intensity: 1 Onset of pain  Pain is worse when running Nutritional Status BMI of 25 - 29 = overweight  Does patient need assistance? Functional Status Self care Ambulation Normal   Primary Care Provider:  Neena Rhymes MD  CC:  patellofemoral pain [name].  History of Present Illness: 35 yo F here for left knee pain  Patient reports she just recently finished running a 1/2 marathon 2 weeks ago About 2 weeks prior to that when running her long run, started to develop lateral left knee pain under kneecap at about the 10-12 mile mark. No known injury No swelling or bruising No catching, locking, giving out. Typically runs 5 miles 3x/week and then a long run on weekend. Taking naprosyn, icing, crosstraining with walking and cycling Pain seems to occur almost exclusively with running - some radiation up left leg. No back pain. No prior knee problems.  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 By Mouth Once Daily 2)   Patanase 0.6 % Soln (Olopatadine Hcl) .... Two Times A Day 3)  Nasonex 50 Mcg/act Susp (Mometasone Furoate) .... Once Daily  Allergies (verified): No Known Drug Allergies  Family History: Reviewed history from 07/20/2009 and no changes required. CAD-maternal grandfather MI age 57 HTN-no DM-maternal grandfather STROKE-no COLON CA- no BREAST CA-no Brother-Sudden cardiac death  Social History: Reviewed history from 08/17/2008 and no changes required. 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 Mild TTP lateral posterior patellar facet.  No other TTP including joint lines. FROM. Strength 4/5 with hip abduction (5/5 on right) Stable to valgus/varus stress Negative anterior/post drawers, lachmanns Negative mcmurrays and apleys Negative apprehension. Moderate overpronation, pes planus No leg length inequality No evidence of scoliosis Bilateral stiffness SI joints with fabers but equal   Impression & Recommendations:  Problem # 1:  PATELLO-FEMORAL [name] (ICD-719.46) Assessment Unchanged Agree with dx of patellofemoral [name].  Start VMO, hip abduction exercises, arch supports to correct risk factors and treat condition.  Tylenol/aleve, icing as needed.  F/u in 6 weeks.  Consider formal PT if not improving.  Complete Medication List: 1)  Zyrtec Allergy 10 Mg Tabs (Cetirizine hcl) .... By mouth once daily 2)  Patanase 0.6 % Soln (Olopatadine hcl) .... Two  times a day 3)  Nasonex 50 Mcg/act Susp (Mometasone furoate) .... Once daily  Patient Instructions: 1)  You have patellofemoral [name] of your left knee. 2)  Avoid painful activities (especially squats and lunges, plyometrics, increasing running mileage) as much as possible. 3)  Cross  train with swimming, cycling with low resistance, elliptical 4)  Straight leg raise, hip abduction, hip adduction/VMO exercises at least once daily - 3 sets of 15 5)  Consider formal physical therapy if not improving over 4-6 weeks. 6)  Ensure you have arch support with your shoes or inserts. 7)  Icing 15 minutes at a time 3-4 times a day 8)  Tylenol or aleve as needed for pain 9)  Follow up with me in 6 weeks for a recheck unless 100% improved.   Orders Added: 1)  New Patient Level III [16109]

## 2010-11-05 ENCOUNTER — Other Ambulatory Visit: Payer: Self-pay | Admitting: Family

## 2010-11-05 ENCOUNTER — Encounter: Payer: Self-pay | Admitting: Family

## 2010-11-05 ENCOUNTER — Ambulatory Visit (INDEPENDENT_AMBULATORY_CARE_PROVIDER_SITE_OTHER): Payer: BC Managed Care – PPO | Admitting: Family

## 2010-11-05 DIAGNOSIS — N390 Urinary tract infection, site not specified: Secondary | ICD-10-CM

## 2010-11-05 DIAGNOSIS — R3129 Other microscopic hematuria: Secondary | ICD-10-CM

## 2010-11-05 DIAGNOSIS — R300 Dysuria: Secondary | ICD-10-CM

## 2010-11-05 DIAGNOSIS — J329 Chronic sinusitis, unspecified: Secondary | ICD-10-CM

## 2010-11-05 LAB — POCT URINALYSIS DIPSTICK
Bilirubin, UA: NEGATIVE
Glucose, UA: NEGATIVE
Ketones, UA: NEGATIVE
Nitrite, UA: POSITIVE
Protein, UA: NEGATIVE
Spec Grav, UA: 1.005
Urobilinogen, UA: 0.2
pH, UA: 7

## 2010-11-05 MED ORDER — CEFUROXIME AXETIL 500 MG PO TABS: 500.0000 mg | ORAL_TABLET | Freq: Two times a day (BID) | ORAL | Status: AC

## 2010-11-05 NOTE — Assessment & Plan Note (Signed)
35 yr old female with with recurrent sinusitis.  Will treat with ceftin and will refer to ENT for further evalution.

## 2010-11-05 NOTE — Patient Instructions (Signed)
You will be contacted about your referral to ENT. Call if you develop fever, if symptoms worsen,or if they are not improved in 48-72 hours.

## 2010-11-05 NOTE — Assessment & Plan Note (Signed)
UA is +, symptomatic.  Will treat with ceftin and culture urine.

## 2010-11-05 NOTE — Progress Notes (Signed)
  Subjective:    Patient ID: Kelsey [name], female    DOB: 1975-07-13, 35 y.o.   MRN: [id number]  HPI  [name] is a 35 yr old female who presents today with two concerns:  1) Sinus Infection-  She is on her last day of a 10 day round of amoxicillin.  She reports 8-9 rounds of abx since October for sinusitis.  She has been using neti pot nasal steroids. She reports that she had a CT of the sinus which was reportedly negative. She is being followed by an allergist.  She is actively  trying to become pregant- reports negative home test yesterday.  2) Dysuria- Pt reports + urgency and frequency.      Review of Systems See HPI  Past Medical History  Diagnosis Date  . Pyelonephritis 2008    History   Social History  . Marital Status: Married    Spouse Name: [name]    Number of Children: 2  . Years of Education: [name]   Occupational History  . 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 Smoking status: Never Smoker   . Smokeless tobacco: Never Used  . Alcohol Use: Not on file  . Drug Use: Not on file  . Sexually Active: Not on file   Other Topics Concern  . Not on file   Social History Narrative   1 daughter--[name] born 12/09, 1 step son    Past Surgical History  Procedure Date  . Cesarean section   . Knee arthroscopy     right knee    Family History  Problem Relation Age of Onset  . Heart disease Brother     sudden cardiac death  . Heart disease Maternal Grandfather 23    MI  . Coronary artery disease Maternal Grandfather   . Diabetes Maternal Grandfather   . Cancer Neg Hx   . Stroke Neg Hx   . Hypertension Neg Hx     No Known Allergies  No current outpatient prescriptions on file prior to visit.    BP 100/80  Pulse 72  Temp(Src) 97.9 F (36.6 C) (Oral)  Resp 16  Ht 5' 2.99" (1.6 m)  Wt 143 lb 1.3 oz (64.901 kg)  BMI 25.35 kg/m2       Objective:   Physical Exam  Constitutional: She appears well-developed and  well-nourished.  HENT:  Head: Normocephalic.       Bilateral maxillary sinus tenderness to palpation.   R TM- + clear effusion noted, no bulging or erythema. L TM- purulent effusion- no bulging.  Eyes: Conjunctivae are normal. Pupils are equal, round, and reactive to light.  Neck: Normal range of motion. Neck supple.  Cardiovascular: Normal rate and regular rhythm.   Pulmonary/Chest: Effort normal and breath sounds normal.          Assessment & Plan:

## 2010-11-07 LAB — URINE CULTURE: Colony Count: >=100000

## 2010-11-20 NOTE — Consult Note (Signed)
NAMELAVEYAH, Kelsey [name]                ACCOUNT NO.:  [id number]   MEDICAL RECORD NO.:  [id number]          PATIENT TYPE:  INP   LOCATION:  9373                          FACILITY:  [facility]   PHYSICIAN:  Marca Ancona, MD      DATE OF BIRTH:  April 30, 1976   DATE OF CONSULTATION:  06/09/2008  DATE OF DISCHARGE:                                 CONSULTATION   PRIMARY CARDIOLOGISTS:  [name]. Eden Emms, MD, [facility] and Duke Salvia,  MD, [facility]   HISTORY OF PRESENT ILLNESS:  This is a 35 year old with a history of  concealed long QT syndrome who is now status post delivery by C-section.  The patient delivered at about noon yesterday.  She has had no  arrhythmias by telemetry monitoring here and the corrected QT interval  is normal on her EKG.  She has never had syncope or documented  arrhythmia.  She does report occasional episodes of lightheadedness and  dizziness in the past.  She has been evaluated by a Holter monitor and  an event monitor for these symptoms and thinks that these have all been  unremarkable.  She is not quite eager to leave the ICU to see her baby.   LABORATORY DATA:  Today, magnesium level has decreased to 1.3, potassium  is 4.0, creatinine 0.64, hematocrit is 30.5, and platelets 145.   CURRENT MEDICATIONS:  Nadolol 60 mg daily and Dilaudid p.r.n.   EKG shows normal sinus rhythm.  The corrected QT interval is  approximately 400 milliseconds.  Telemetry was reviewed and shows no  events.   PAST MEDICAL HISTORY:  1. Long QT syndrome.  The patient's brother died suddenly at the age      of 11 after an alarm clock trigger.  The patient has been diagnosed      with concealed long QT based on her family history and genotype      testing.  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  Her baseline EKG has a normal      corrected QT interval.  Her QT interval has increased to around 450      with a treadmill exercise test in the past.  This  testing has been      done in [place] [facility].  She has been on a beta-blocker long term      and has no history of syncope or arrhythmias.  2. History of migraines.  3. The patient does report a recent unremarkable echocardiogram in      [facility].   SOCIAL HISTORY:  The patient is married.  This is her first child.  She  also has a stepchild.  She is a Visual merchandiser.  She does not  smoke and does not drink alcohol, does not use any illicit drugs.   FAMILY HISTORY:  The patient's brother had sudden cardiac death at the  age of 38 after an alarm clock trigger.   REVIEW OF SYSTEMS:  Negative except as noted in the history of  present  illness.   PHYSICAL EXAMINATION:  VITAL SIGNS:  Heart rate is in the 80s and is  regular, blood pressure is 100s/50s, and O2 sat 98% on room air.  GENERAL:  This is a well-developed female, in no apparent distress.  NEUROLOGIC:  Alert and oriented x3.  Normal affect.  NECK:  There is no JVD.  There is no thyromegaly or thyroid nodule.  LUNGS:  Slight bibasilar crackles with normal respiratory effort.  CARDIOVASCULAR:  Heart regular S1 and S2.  No S3, no S4.  There is a 2/6  systolic ejection murmur heard at the upper sternal border.  There is a  1+ ankle edema.  ABDOMEN:  Soft and benign, postop appearance.  She does have bowel  sounds.  EXTREMITIES:  There is no clubbing or cyanosis.  MUSCULOSKELETAL:  Normal.  SKIN:  Normal.  HEENT:  Normal.   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  She  has never had documented arrhythmia or syncope.  She has been on a beta-  blockade long term.  Her corrected QT interval is normal at baseline,  but it is mildly increased with exercise testing.  Her current  electrocardiogram has a normal corrected QT interval at about 400  milliseconds.  She did tolerate her cesarean section yesterday without  development of an  arrhythmia and her telemetry monitoring while in the  intensive care unit has been negative.  Our plan would be to continue  her on her beta-blocker, which is nadolol.  She is at somewhat higher  risk in the postpartum period, therefore, I would recommend monitoring  her on telemetry for total of 48 hours after delivery especially in the  setting of her decreased magnesium today.  I would have a goal to keep  her potassium greater than 4 and her magnesium level greater than 2.  I  will go ahead and give her some magnesium sulfate as her magnesium level  is currently 1.3.  I think that she should be able to come off telemetry  at about noon tomorrow also I would like her to be allowed to visit her  baby on telemetry with the assistance of a nurse tonight and tomorrow  morning before she leaves the intensive care unit.      Marca Ancona, MD  Electronically Signed     DM/MEDQ  D:  06/09/2008  T:  06/10/2008  Job:  [id number]

## 2010-11-20 NOTE — Assessment & Plan Note (Signed)
[facility]                            CARDIOLOGY OFFICE NOTE   Kelsey, [name]               MRN:          [id number]  DATE:01/21/2008                            DOB:          24-Jun-1976    Kelsey [name] is a pleasant 35 year old gravida 1, para 1 referred by Dr.  Noland Fordyce.  The patient has history of long QT syndrome.  Apparently, she has been seen by Dr. Mary Sella in the past who verified  this diagnosis.  She had a 40 year old brother who died suddenly.  She  subsequently has been seen by Dr. Cherly Hensen at [facility].  Unfortunately,  I do not have any of these records.  The patient needed a local  cardiologist for her upcoming delivery which will be at [facility].  She has been on longstanding metoprolol 50 b.i.d.   She has not had any other congenital abnormalities.  She has not had any  recent workup for echocardiogram.  She denies any significant  palpitations or syncope.   She is currently [redacted] weeks pregnant.   In talking to the patient, she gives a good clinical history for long QT  syndrome as well as a sudden death in a brother.  She seems to think  that the gene has been carried by her mother.   Again unfortunately, I do not have any records and at least EKG that we  did in our office today is normal.   I explained to her that there is not a high amount of risk in keeping  her on her beta blockers during pregnancy.  Certainly, the baby's heart  rate may be slow and there is a risk of hypoglycemia during delivery,  but overall I think it is probably reasonable to continue her beta  blocker.   REVIEW OF SYSTEMS:  Otherwise negative.   PAST MEDICAL HISTORY:  Remarkable for occasional migraines with previous  right arthroscopic knee surgery, otherwise negative.   SOCIAL HISTORY:  The patient is happily married.  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  Her husband  works on an Facilities manager and has gone for a month at that time.  She has 1 stepchild.  She does not smoke or drink.  She is originally  from [place], [place].   FAMILY HISTORY:  Remarkable for sudden death in her brother at age 66  and apparent long QT syndrome gene in her mother.   The patient does exercise on a treadmill 35 minutes 4 times a week  without sequela.   Mother and father both still alive at ages 53 and 79.  She also has a  living brother at 30 who does not have long QT syndrome.   ALLERGIES:  She denies any allergies.   MEDICATIONS:  She needs to avoid any medications that prolong her QT  interval.  She is currently taking metoprolol 50 b.i.d., Protonix 40 a  day, Macrobid 100 a day, prenatal vitamins, calcium, vitamin C, and fish  oil.   PHYSICAL EXAMINATION:  GENERAL:  Remarkable for healthy-appearing white  female  in no distress.  VITAL SIGNS:  Her weight is 138, blood pressure is 120/70, her pulse is  95 and regular, respiratory rate 14, and afebrile.  HEENT:  Unremarkable.  Carotids are without bruit.  No lymphadenopathy,  thyromegaly, or JVP elevation.  LUNGS:  Clear.  Good diaphragmatic motion.  No wheezing.  HEART:  S1 and S2.  Normal heart sounds.  PMI normal.  ABDOMEN:  Benign, consistent with gestational age.  No  hepatosplenomegaly.  No hepatojugular reflux.  No tenderness.  No bruit.  Distal pulses intact.  No edema.  NEUROLOGIC:  Nonfocal.  SKIN:  Warm and dry.  No weakness.   LABORATORY DATA:  EKG is normal, sinus rhythm at a rate of 96, PR  interval is 130, QT interval is 324, corrected at 412 milliseconds.   IMPRESSION:  1. History of long QT interval with pregnancy, currently asymptomatic.      I will have the patient to follow up with Dr. Graciela Husbands.  We will try      to get records from Dr. Mary Sella and Dr. Cherly Hensen.  She appears to have      had an extensive workup.  I do not know that her QT interval would      be abnormal off beta blockers,  but certainly it is reasonable to      continue it during pregnancy.  2. Dyspnea in the setting of pregnancy.  Check 2D echocardiogram, rule      out any other structural heart disease.  3. Previous arthroscopic knee surgery, currently stable.  Avoid      nonsteroidals while pregnant.  4. History of reflux exacerbation at the time of pregnancy.  Continue      Protonix 40 a day.   Again, the patient is well aware.  We had a long discussion about  getting a list of medications that should be avoided with long QT  syndrome.   Since this really is an electrophysiology issue, I would feel more  comfortable if the patient were to be established with Dr. Graciela Husbands, and  hopefully by then we will get records from Dr. Mary Sella and Dr. Cherly Hensen.   Again, I do not foresee a high risk of any problems during pregnancy.     [name]. Eden Emms, MD, [facility]  Electronically Signed   PCN/MedQ  DD: 01/21/2008  DT: 01/22/2008  Job #: [id number]

## 2010-11-20 NOTE — Assessment & Plan Note (Signed)
[facility]                         ELECTROPHYSIOLOGY OFFICE NOTE   NAME:FIELDSStephannie, Kelsey [name]               MRN:          [id number]  DATE:06/30/2008                            DOB:          [birth year]    [name] is seen following the delivery of her child.  She carries a  diagnosis of long QT syndrome based on the death of her brother and  borderline abnormal electrocardiograms.  This is derived from evaluation  by Dr. Inocencio Homes further and confirmed by the doctors at [facility].   The question that she and her husband have when they come in today is  what they should do next.  Specifically, this derives from a suggestion  to me by Dr. Ebony Hail with whom I discussed her peripartum care  prior to the delivery.  Specifically, the question was can her diagnosis  be confirmed or not.  I should note that her familial screen was  negative.   What we decided what he would offer to do and what the family would like  to do is to schedule an appointment to see Dr. Rolly Salter at the [facility] to see if they can get clarity as to the diagnosis.  We will be  glad to facilitate that for her.  In the interim, we will renew her  nadolol prescription once we know what the dosing is.  I should note  that the child's electrocardiograms as evaluated by Dr. Rosiland Oz have  all been negative and that repeat evaluations are scheduled going  forward.   PHYSICAL EXAMINATION:  VITAL SIGNS:  Today, her blood pressure was  110/73, her pulse was 71, and her weight was 153.  LUNGS:  Clear.  HEART:  Sounds were regular.  EXTREMITIES:  Had no edema.   Electrocardiogram today demonstrated sinus rhythm at 71 with intervals  of 0.14/0.08/0.39 with a QTC of about 0.42.   We will help facilitate the patient has seen Dr. Rolly Salter.  We will  forward the Dr. Thamas Jaegers review and we will be glad to see the patient  going forward, if we can be of assistance.     Duke Salvia, MD, [facility]  Electronically Signed    SCK/MedQ  DD: 06/30/2008  DT: 06/30/2008  Job #: [id number]   cc:   Lendon Colonel, MD

## 2010-11-20 NOTE — Op Note (Signed)
Kelsey [name], Kelsey [name]                ACCOUNT NO.:  [id number]   MEDICAL RECORD NO.:  [id number]          PATIENT TYPE:  INP   LOCATION:  9373                          [facility]ACILITY:  [facility]   PHYSICIAN:  Lenoard Aden, M.D.DATE OF BIRTH:  September 12, 1975   DATE OF PROCEDURE:  06/08/2008  DATE OF DISCHARGE:                               OPERATIVE REPORT   PREOPERATIVE DIAGNOSES:  1. Term intrauterine pregnancy.  2. Long QT syndrome, maternal.  3. Nonreassuring fetal heart rate tracing.   POSTOPERATIVE DIAGNOSES:  1. Term intrauterine pregnancy.  2. Long QT syndrome, maternal.  3. Nonreassuring fetal heart rate tracing.  4. Occiput posterior presentation.   PROCEDURE:  Primary low segment transverse cesarean section.   SURGEON:  Lenoard Aden, MD   ASSISTANT:  Marlinda Mike, CNM   ANESTHESIA:  Epidural.   ESTIMATED BLOOD LOSS:  1000 mL.   COMPLICATIONS:  None.   DRAINS:  Foley counts correct.   The patient is recovering in good condition.   BRIEF OPERATIVE NOTE:  After being apprised of risks of anesthesia,  infection, bleeding, injury to abdominal organs, need for repair, the  labor's immediate complications to include bowel and bladder injury, the  patient was brought to the operating room where she was administered  dosing of epidural anesthetic without complications.  She was prepped  and draped in the usual sterile fashion.  A Foley catheter was placed.  After achieving adequate anesthesia, dilute Marcaine solution was  placed.  Pfannenstiel skin incision was made with a scalpel, carried  down to fascia, which was nicked in the midline and opened transversely  using Mayo scissors.  Rectus muscles were dissected sharply in the  midline and peritoneum entered sharply.  Bladder blade was placed.  Visceral peritoneum scored sharply off the lower uterine segment.  Kerr  hysterotomy incision was made.  Atraumatic delivery of full-term living  female from an occiput posterior  position and handed to pediatricians in  attendance.  Apgars were 9 and 9.  Cord blood collected.  Cord pH  collected.  Placenta remained intact, three-vessel cord, uterus  exteriorized, curetted using a dry lap pack and closed in 2 running  imbricating layers of 0 Monocryl suture.  Left lateral extension is  secured using a 0 Monocryl interrupted suture.  Good hemostasis was  achieved.  Bladder flap was inspected and found to be hemostatic.  Irrigation accomplished.  The uterus was replaced in the abdominal  cavity, and normal  tubes and normal ovaries were noted.  Fascia was then closed using a 0-  Monocryl in continuous running fashion.  The skin was closed using  staples.  The patient tolerated the procedure well and was transferred  to recovery room in stable condition.      Lenoard Aden, M.D.  Electronically Signed     RJT/MEDQ  D:  06/08/2008  T:  06/09/2008  Job:  [id number]

## 2010-11-20 NOTE — Consult Note (Signed)
NAMEELIAS, BORDNER              ACCOUNT NO.:  [id number]   MEDICAL RECORD NO.:  [id number]          PATIENT TYPE:  INP   LOCATION:  1412                         FACILITY:  [facility]   PHYSICIAN:  Bertram Millard. Dahlstedt, M.D.DATE OF BIRTH:  Oct 17, 1975   DATE OF CONSULTATION:  03/29/2007  DATE OF DISCHARGE:                                 CONSULTATION   A 35 year old female who was admitted yesterday with about a 1-week  history of worsening right-sided back pain, fever and nausea.  She  presented to the emergency room at [facility] yesterday.  She  had mild leukocytosis.  Urinalysis was consistent with a urinary tract  infection.  She was admitted for pain management and IV antibiotics.  She was admitted to the [facility] service.   She does have a history of recurrent urinary tract infections but no  febrile infections.  She denies any gross hematuria.  She has never  passed a stone but in a hematuria evaluation within the past 2 years was  found to have small stones in her right kidney.  She has seen Dr. Virl Diamond  in [place] before.   She had a CT scan done yesterday.  This showed small, nonobstructing  right renal calculi, mild perinephric stranding on the right and minimal  right hydronephrosis.  She was not felt to have any ureteral calculi.  Further urologic evaluation was requested.   PAST MEDICAL HISTORY:  1. Prolonged QT syndrome.  2. Anxiety and depression.  3. Bulimia.  4. She has had a right arthroscopy in the past.   MEDICATIONS:  1. Yaz.  2. Zoloft 50 mg a day.  3. Wellbutrin.  4. Atenolol 100 mg a day.   She has no known drug allergies.   The patient is widowed.  Her husband committed suicide.  She does not  smoke.  She drinks occasionally.  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   She has had mild to urinary frequency and urgency.  No gross hematuria,  no dysuria.  Her nausea and vomiting has improved but she is still  having right-sided back pain.   FAMILY HISTORY:  Noncontributory.   EXAM:  A pleasant adult female.  She appeared comfortable.  Abdomen was flat, soft, nondistended and had mild to moderate right  lower quadrant and right CVA tenderness.  No rebound or guarding, no  mass, no hepatosplenomegaly.   Laboratories were reviewed.   CT scan was reviewed.   IMPRESSION:  1. Pyelonephritis, right.  2. Small, nonobstructing right renal calculi.  3. History of recurrent urinary tract infections.  4. History of negative hematuria evaluation.   PLAN:  1. Continue broad-spectrum antibiotics for the present time, await      urine cultures.  2. Agree with nonsteroidal anti-inflammatories.  3. Heating pad to the back.  4. I will follow up but doubt further evaluation will be needed.      Bertram Millard. Dahlstedt, M.D.  Electronically Signed     SMD/MEDQ  D:  03/30/2007  T:  03/31/2007  Job:  [id number]   cc:  Dr. Odis Luster  [place], [place]

## 2010-11-20 NOTE — H&P (Signed)
Kelsey [name], Kelsey [name]              ACCOUNT NO.:  [id number]   MEDICAL RECORD NO.:  [id number]          PATIENT TYPE:  EMS   LOCATION:  ED                           FACILITY:  [facility]   PHYSICIAN:  Wilson Singer, M.D.DATE OF BIRTH:  1976/05/24   DATE OF ADMISSION:  03/29/2007  DATE OF DISCHARGE:                              HISTORY & PHYSICAL   HISTORY:  This is a pleasant 35 year old lady who has a 2-day history of  right-sided loin/back pain associated which shake/fevers and chills, and  episode of vomiting today.  She says she has had several urinary tract  infections in the past, every 3-4 months, the last 3-4 years.  She  apparently has been investigated for these, but no obvious cause has  been found.   PAST MEDICAL HISTORY:  History of prolonged QT syndrome.   PAST SURGICAL HISTORY:  Right knee arthroscopy at the age of 53.   SOCIAL HISTORY:  She lives with her fiance.  She does not smoke and  occasionally drinks alcohol.  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   FAMILY HISTORY:  Noncontributory.   MEDICATIONS:  1. Atenolol 100 mg q. day.  2. Wellbutrin 3 mg q. day.  3. Zoloft 50 mg q. day.  4. Birth control pill.   ALLERGIES:  None.   REVIEW OF SYSTEMS:  Problem with symptoms mentioned above.  There are no  other symptoms referable to all systems reviewed.   PHYSICAL EXAMINATION:  Temperature T-max 103 degrees Fahrenheit, blood  pressure 111/58, pulse 120s, saturation 97%.  She actually surprisingly does not look toxic or septic clinically  despite her tachycardia.  CARDIOVASCULAR:  Heart sounds are present and normal.  LUNG [name]:  Clear.  ABDOMEN:  Soft but she is tender in the right loin area.  NEUROLOGICAL:  She is alert and oriented with no focal neurological  signs.   INVESTIGATIONS:  Urinalysis was consistent with a urinary tract  infection.  Hemoglobin 11.7, white blood cell count 11.8, platelets 213.  Sodium 136, potassium 3, bicarbonate 21, BUN 14,  creatinine 0.77,  glucose 107.   IMPRESSION:  1. Right pyelonephritis.  2. Prolonged QT syndrome.  3. Depression.  4. History of anorexia/bulimia.   PLAN:  1. Admit.  2. Intravenous fluids.  3. Intravenous antibiotics with Rocephin, which is deemed safe in      patient with QT syndrome.   Further recommendations will depend on the patient's hospital progress.      Wilson Singer, M.D.  Electronically Signed     NCG/MEDQ  D:  03/29/2007  T:  03/30/2007  Job:  [id number]

## 2010-11-20 NOTE — Discharge Summary (Signed)
Kelsey [name], Kelsey [name]              ACCOUNT NO.:  [id number]   MEDICAL RECORD NO.:  [id number]          PATIENT TYPE:  INP   LOCATION:  1412                         FACILITY:  [facility]   PHYSICIAN:  Isidor Holts, M.D.  DATE OF BIRTH:  01-Sep-1975   DATE OF ADMISSION:  03/28/2007  DATE OF DISCHARGE:  03/31/2007                               DISCHARGE SUMMARY   PMD:  Unassigned.   DISCHARGE DIAGNOSES:  1. Acute right pyelonephritis secondary to pan sensitive Escherichia      coli.  2. Right nephrolithiasis.  3. History of long QT syndrome.  4. History of depression.   DISCHARGE MEDICATIONS:  1. Atenolol 100 mg p.o. daily.  2. Wellbutrin XL 300 mg p.o. daily.  3. Yaz 28 one pill p.o. daily.  4. Zoloft 50 mg p.o. daily.  5. Multivitamin one p.o. daily.  6. Folic acid 5 mg p.o. daily.  7. Iron tablet one p.o. daily.  8. Calcium one p.o. daily.  9. Keflex 500 mg p.o. b.i.d. for 10 days only (from April 01, 2007).  10.Ibuprofen 600 mg p.o. t.i.d. with food for 5 days only.  11.Norco (5/325) one p.o. p.r.n. q.4-6 h. a total of 20 pills have      been dispensed.   PROCEDURES:  Abdominal/pelvic CT scan dated March 29, 2007.  This  showed right renal enlargement, perinephric stranding, and mild  hydronephrosis.  No definite ureteral calculus is seen.  At least 1-2  punctate right renal calculi are noted.  There was no acute intrapelvic  finding.  No bladder calculus was identified.   CONSULTATIONS:  1. Dr. Retta Diones, urologist.   ADMISSION HISTORY:  See H&P notes of March 29, 2007 dictated by Dr.  Lilly Cove. However, in brief this is a 35 year old female, with  known history of Long QT syndrome, depression prior history of right  knee arthroscopy on August 17, and history of recurrent urinary tract  infections, who presented with a 2-day history of right-sided loin pain  associated with fever and chills, vomited x1 on the day of presentation.  She was  admitted for further evaluation, investigation, and management.   CLINICAL COURSE:  Problem #1:  ACUTE RIGHT PYELONEPHRITIS.  For details  of presentation, refer to admission history above.  The patient presents  with a WBC of 11.8 pyrexia of 103 degrees, and positive urinary sediment  consistent with urinary tract infection.  Clinical features were  consistent with acute right pyelonephritis.  The patient was managed  with intravenous fluids, intravenous Rocephin, NSAIDS, p.r.n.  analgesics.  Urine and a blood samples were sent off for culture.  Urine  culture subsequently grew pansensitive E. coli.  The patient responded  to these management measures, and by March 28, 2007, she felt  considerably better with defervescence and diminishing white cell count.  By March 31, 2007.  She had become completely asymptomatic apart  from right loin discomfort, and was deemed clinically stable for  discharge on oral antibiotic medication.   Problem #2:  UROLITHIASIS.  Because of the patient's recurrent episodes  of urinary tract  infections, it was felt that abdominal CT scan/pelvic  CT scan, stone protocol, was indicated.  This was carried out on  March 29, 2007, and demonstrated 1-2 punctate right renal calculi.  Although no obstructive lesions were seen.  Urology consultation was  kindly provided by Dr. Retta Diones who agreed with the above management  measures, and has recommended follow up with him in 2-3 weeks following  discharge.   Problem #3:  HISTORY OF LONG QT SYNDROME.  The patient was monitored  telemetrically throughout the course of her hospitalization and  demonstrated no arrhythmias.   Problem #4:  HISTORY OF DEPRESSION.  The patient's mood was stable on  pre-admission psychotropics, throughout the course of her  hospitalization.   DISPOSITION:  The patient was considered sufficiently clinically  recovered and stable for discharge on March 31, 2007.  She grew   coagulase negative Staphylococcus species in 1 set of 4 blood cultures,  consistent with a contaminant.  She was, therefore, reassured  accordingly.  She is recommended to return to her regular duties on  April 06, 2007.   DIET:  No restrictions.   ACTIVITY:  Recommended to increase activity slowly.   FOLLOWUP INSTRUCTIONS:  The patient is recommended to followup with Dr.  Retta Diones, urologist, in 2-3 weeks.  She has been instructed to call for  appointment, and has verbalized understanding.      Isidor Holts, M.D.  Electronically Signed     CO/MEDQ  D:  03/31/2007  T:  04/01/2007  Job:  [id number]   cc:   Bertram Millard. Dahlstedt, M.D.  Fax: [phone]

## 2010-11-23 NOTE — Discharge Summary (Signed)
Kelsey [name], RUSCONI                ACCOUNT NO.:  [id number]   MEDICAL RECORD NO.:  [id number]          PATIENT TYPE:  INP   LOCATION:  9305                          FACILITY:  [facility]   PHYSICIAN:  Lenoard Aden, M.D.DATE OF BIRTH:  [birth year]   DATE OF ADMISSION:  06/08/2008  DATE OF DISCHARGE:  06/11/2008                               DISCHARGE SUMMARY   The patient underwent uncomplicated primary C-section for nonreassuring  fetal heart rate tracing on June 08, 2008.   POSTOPERATIVE COURSE:  The patient was in the ICU due to her  preoperative care plan established for a long QT syndrome.  She was seen  by Cardiology and subsequently transferred from the unit.  She was  discharged to home on hospital day #3.  Discharge teaching was done.  She is to go home on Tylox, prenatal vitamins, iron and nadolol.  Follow  up in the office as scheduled and to follow up with Cardiology as noted.      Lenoard Aden, M.D.  Electronically Signed     RJT/MEDQ  D:  07/18/2008  T:  07/19/2008  Job:  [id number]

## 2010-12-26 ENCOUNTER — Ambulatory Visit (INDEPENDENT_AMBULATORY_CARE_PROVIDER_SITE_OTHER): Payer: BC Managed Care – PPO | Admitting: Family Medicine

## 2010-12-26 DIAGNOSIS — M21619 Bunion of unspecified foot: Secondary | ICD-10-CM

## 2010-12-26 DIAGNOSIS — R319 Hematuria, unspecified: Secondary | ICD-10-CM | POA: Insufficient documentation

## 2010-12-26 LAB — POCT URINALYSIS DIPSTICK
Bilirubin, UA: NEGATIVE
Glucose, UA: NEGATIVE
Leukocytes, UA: NEGATIVE
Nitrite, UA: NEGATIVE
Protein, UA: NEGATIVE
Spec Grav, UA: 1.015
Urobilinogen, UA: 0.2
pH, UA: 6

## 2010-12-26 NOTE — Progress Notes (Signed)
  Subjective:    Patient ID: Kelsey [name], female    DOB: 09/23/1975, 35 y.o.   MRN: [id number]  HPI Bunion- R foot, + family hx.  Recently enlarging, turning red, painful.  Has toe splint she is using at night.  Recurrent UTI- had infxn the end of April, was first time pt has had gross hematuria.  Has hx of microscopic hematuria.  Has seen [facility] previously.     Review of Systems For ROS see HPI     Objective:   Physical Exam  Constitutional: She appears well-developed and well-nourished. No distress.  Musculoskeletal: Normal range of motion. She exhibits tenderness (R 1st MTP joint at bunion). She exhibits no edema.          Assessment & Plan:

## 2010-12-28 ENCOUNTER — Encounter: Payer: Self-pay | Admitting: Family Medicine

## 2010-12-28 LAB — URINE CULTURE
Colony Count: NO GROWTH
Organism ID, Bacteria: NO GROWTH

## 2010-12-30 NOTE — Assessment & Plan Note (Signed)
Pt w/ hx of microscopic hematuria.  Today UA again shows micro hematuria.  Will [facility] to [facility] for repeat evaluation.

## 2010-12-30 NOTE — Patient Instructions (Signed)
We'll call you with your urology and podiatry appts Call with any questions or concerns

## 2010-12-30 NOTE — Assessment & Plan Note (Signed)
Given that bunion is enlarging and she is having increased pain will refer to podiatry.  Pt expressed understanding and is in agreement w/ plan.

## 2011-01-10 ENCOUNTER — Ambulatory Visit (INDEPENDENT_AMBULATORY_CARE_PROVIDER_SITE_OTHER): Payer: BC Managed Care – PPO | Admitting: Family Medicine

## 2011-01-10 VITALS — BP 110/70 | Temp 98.7°F | Wt 136.6 lb

## 2011-01-10 DIAGNOSIS — J329 Chronic sinusitis, unspecified: Secondary | ICD-10-CM

## 2011-01-10 MED ORDER — AMOXICILLIN 875 MG PO TABS: 875.0000 mg | ORAL_TABLET | Freq: Two times a day (BID) | ORAL | Status: AC

## 2011-01-10 NOTE — Patient Instructions (Signed)
This appears to be a sinus infxn Start the Amoxicillin Call your OB and ask about preferred cough meds Call the ENT and ask about their plan for the recurrent infections- no CT! Call with any questions or concerns Hang in there!!!

## 2011-01-10 NOTE — Progress Notes (Signed)
  Subjective:    Patient ID: Kelsey [name], female    DOB: [birth year], 35 y.o.   MRN: [id number]  HPI Daughter w/ hand/foot/mouth recently.  Sunday she developed sore throat, fatigue, bodyaches.  Tuesday developed diarrhea, fever, chills.  That resolved yesterday but now pt w/ severe sinus pressure, L ear pain, green nasal congestion.  + cough- hacking, nonproductive.  Recently found out she was pregnant.   Review of Systems For ROS see HPI     Objective:   Physical Exam  Constitutional: She appears well-developed and well-nourished. No distress.  HENT:  Head: Normocephalic and atraumatic.  Right Ear: Tympanic membrane normal.  Left Ear: Tympanic membrane normal.  Nose: Mucosal edema and rhinorrhea present. Right sinus exhibits maxillary sinus tenderness and frontal sinus tenderness. Left sinus exhibits maxillary sinus tenderness and frontal sinus tenderness.  Mouth/Throat: Uvula is midline and mucous membranes are normal. Posterior oropharyngeal erythema present. No oropharyngeal exudate.  Eyes: Conjunctivae and EOM are normal. Pupils are equal, round, and reactive to light.  Neck: Normal range of motion. Neck supple.  Cardiovascular: Normal rate, regular rhythm and normal heart sounds.   Pulmonary/Chest: Effort normal and breath sounds normal. No respiratory distress. She has no wheezes.  Lymphadenopathy:    She has no cervical adenopathy.          Assessment & Plan:

## 2011-01-10 NOTE — Assessment & Plan Note (Signed)
Hx and sxs consistent w/ infxn.  Start amox.  No cough meds as they are all category C.  Pt to call ENT and let them know she has another infxn but that she is unable to have CT at this time.  Reviewed supportive care and red flags that should prompt return.  Pt expressed understanding and is in agreement w/ plan.

## 2011-02-17 LAB — ANTIBODY SCREEN: Antibody Screen: NEGATIVE

## 2011-02-17 LAB — ABO/RH: RH Type: NEGATIVE

## 2011-02-17 LAB — HIV ANTIBODY (ROUTINE TESTING W REFLEX): HIV: NONREACTIVE

## 2011-02-17 LAB — RUBELLA ANTIBODY, IGM: Rubella: IMMUNE

## 2011-02-17 LAB — HEPATITIS B SURFACE ANTIGEN: Hepatitis B Surface Ag: NEGATIVE

## 2011-02-17 LAB — RPR: RPR: NONREACTIVE

## 2011-04-12 LAB — CBC
HCT: 30.5 % — ABNORMAL LOW (ref 36.0–46.0)
HCT: 32.2 % — ABNORMAL LOW (ref 36.0–46.0)
Hemoglobin: 10.6 g/dL — ABNORMAL LOW (ref 12.0–15.0)
Hemoglobin: 11.4 g/dL — ABNORMAL LOW (ref 12.0–15.0)
MCHC: 34.9 g/dL (ref 30.0–36.0)
MCHC: 35.4 g/dL (ref 30.0–36.0)
MCV: 93.6 fL (ref 78.0–100.0)
MCV: 95.5 fL (ref 78.0–100.0)
Platelets: 145 10*3/uL — ABNORMAL LOW (ref 150–400)
Platelets: 145 10*3/uL — ABNORMAL LOW (ref 150–400)
RBC: 3.19 MIL/uL — ABNORMAL LOW (ref 3.87–5.11)
RBC: 3.44 MIL/uL — ABNORMAL LOW (ref 3.87–5.11)
RDW: 13.6 % (ref 11.5–15.5)
RDW: 13.6 % (ref 11.5–15.5)
WBC: 11.9 10*3/uL — ABNORMAL HIGH (ref 4.0–10.5)
WBC: 9.3 10*3/uL (ref 4.0–10.5)

## 2011-04-12 LAB — POTASSIUM: Potassium: 3.5 mEq/L (ref 3.5–5.1)

## 2011-04-12 LAB — COMPREHENSIVE METABOLIC PANEL
ALT: 14 U/L (ref 0–35)
ALT: 14 U/L (ref 0–35)
AST: 20 U/L (ref 0–37)
AST: 26 U/L (ref 0–37)
Albumin: 2 g/dL — ABNORMAL LOW (ref 3.5–5.2)
Albumin: 2.6 g/dL — ABNORMAL LOW (ref 3.5–5.2)
Alkaline Phosphatase: 68 U/L (ref 39–117)
Alkaline Phosphatase: 83 U/L (ref 39–117)
BUN: 2 mg/dL — ABNORMAL LOW (ref 6–23)
BUN: 7 mg/dL (ref 6–23)
CO2: 23 mEq/L (ref 19–32)
CO2: 28 mEq/L (ref 19–32)
Calcium: 8.4 mg/dL (ref 8.4–10.5)
Calcium: 9.3 mg/dL (ref 8.4–10.5)
Chloride: 103 mEq/L (ref 96–112)
Chloride: 107 mEq/L (ref 96–112)
Creatinine, Ser: 0.64 mg/dL (ref 0.4–1.2)
Creatinine, Ser: 0.64 mg/dL (ref 0.4–1.2)
GFR calc Af Amer: >60 mL/min (ref >60)
GFR calc Af Amer: >60 mL/min (ref >60)
GFR calc non Af Amer: >60 mL/min (ref >60)
GFR calc non Af Amer: >60 mL/min (ref >60)
Glucose, Bld: 86 mg/dL (ref 70–99)
Glucose, Bld: 95 mg/dL (ref 70–99)
Potassium: 3.5 mEq/L (ref 3.5–5.1)
Potassium: 4 mEq/L (ref 3.5–5.1)
Sodium: 136 mEq/L (ref 135–145)
Sodium: 136 mEq/L (ref 135–145)
Total Bilirubin: 0.3 mg/dL (ref 0.3–1.2)
Total Bilirubin: 0.3 mg/dL (ref 0.3–1.2)
Total Protein: 4.3 g/dL — ABNORMAL LOW (ref 6.0–8.3)
Total Protein: 5.6 g/dL — ABNORMAL LOW (ref 6.0–8.3)

## 2011-04-12 LAB — MAGNESIUM
Magnesium: 1.3 mg/dL — ABNORMAL LOW (ref 1.5–2.5)
Magnesium: 1.6 mg/dL (ref 1.5–2.5)
Magnesium: 1.7 mg/dL (ref 1.5–2.5)

## 2011-04-12 LAB — RPR: RPR Ser Ql: NONREACTIVE

## 2011-04-18 LAB — CBC
HCT: 29 — ABNORMAL LOW
HCT: 29.6 — ABNORMAL LOW
HCT: 33.2 — ABNORMAL LOW
Hemoglobin: 10 — ABNORMAL LOW
Hemoglobin: 10.4 — ABNORMAL LOW
Hemoglobin: 11.7 — ABNORMAL LOW
MCHC: 34.4
MCHC: 35.1
MCHC: 35.4
MCV: 86.8
MCV: 88.3
MCV: 88.4
Platelets: 192
Platelets: 195
Platelets: 213
RBC: 3.28 — ABNORMAL LOW
RBC: 3.34 — ABNORMAL LOW
RBC: 3.82 — ABNORMAL LOW
RDW: 11.9
RDW: 12.5
RDW: 12.5
WBC: 11.8 — ABNORMAL HIGH
WBC: 12.7 — ABNORMAL HIGH
WBC: 7.1

## 2011-04-18 LAB — URINALYSIS, ROUTINE W REFLEX MICROSCOPIC
Bilirubin Urine: NEGATIVE
Glucose, UA: NEGATIVE
Ketones, ur: NEGATIVE
Nitrite: NEGATIVE
Protein, ur: NEGATIVE
Specific Gravity, Urine: 1.018
Urobilinogen, UA: 0.2
pH: 6.5

## 2011-04-18 LAB — URINE CULTURE: Colony Count: >=100000

## 2011-04-18 LAB — CULTURE, BLOOD (ROUTINE X 2)
Culture: NO GROWTH
Culture: NO GROWTH
Culture: NO GROWTH

## 2011-04-18 LAB — URINE MICROSCOPIC-ADD ON

## 2011-04-18 LAB — BASIC METABOLIC PANEL
BUN: 1 — ABNORMAL LOW
BUN: 3 — ABNORMAL LOW
CO2: 25
CO2: 26
Calcium: 8.1 — ABNORMAL LOW
Calcium: 8.1 — ABNORMAL LOW
Chloride: 109
Chloride: 111
Creatinine, Ser: 0.65
Creatinine, Ser: 0.65
GFR calc Af Amer: >60
GFR calc Af Amer: >60
GFR calc non Af Amer: >60
GFR calc non Af Amer: >60
Glucose, Bld: 115 — ABNORMAL HIGH
Glucose, Bld: 85
Potassium: 4.1
Potassium: 4.1
Sodium: 137
Sodium: 142

## 2011-04-18 LAB — DIFFERENTIAL
Basophils Absolute: 0
Basophils Relative: 0
Eosinophils Absolute: 0
Eosinophils Relative: 0
Lymphocytes Relative: 7 — ABNORMAL LOW
Lymphs Abs: 0.8
Monocytes Absolute: 0.2
Monocytes Relative: 2 — ABNORMAL LOW
Neutro Abs: 10.8 — ABNORMAL HIGH
Neutrophils Relative %: 91 — ABNORMAL HIGH

## 2011-04-18 LAB — COMPREHENSIVE METABOLIC PANEL
ALT: 21
AST: 18
Albumin: 3.2 — ABNORMAL LOW
Alkaline Phosphatase: 52
BUN: 14
CO2: 21
Calcium: 8.8
Chloride: 103
Creatinine, Ser: 0.77
GFR calc Af Amer: >60
GFR calc non Af Amer: >60
Glucose, Bld: 107 — ABNORMAL HIGH
Potassium: 3 — ABNORMAL LOW
Sodium: 136
Total Bilirubin: 0.8
Total Protein: 6.4

## 2011-04-18 LAB — MAGNESIUM: Magnesium: 1.8

## 2011-05-29 ENCOUNTER — Telehealth: Payer: Self-pay | Admitting: Family Medicine

## 2011-05-29 ENCOUNTER — Encounter: Payer: Self-pay | Admitting: Family Medicine

## 2011-05-29 ENCOUNTER — Ambulatory Visit (INDEPENDENT_AMBULATORY_CARE_PROVIDER_SITE_OTHER): Payer: BC Managed Care – PPO | Admitting: Family Medicine

## 2011-05-29 VITALS — BP 117/78 | HR 89 | Temp 98.2°F | Ht 63.0 in | Wt 159.0 lb

## 2011-05-29 DIAGNOSIS — J329 Chronic sinusitis, unspecified: Secondary | ICD-10-CM

## 2011-05-29 MED ORDER — AMOXICILLIN 875 MG PO TABS: 875.0000 mg | ORAL_TABLET | Freq: Two times a day (BID) | ORAL | Status: AC

## 2011-05-29 NOTE — Progress Notes (Signed)
OFFICE NOTE  05/30/2011  CC:  Chief Complaint  Patient presents with  . Sinusitis    6-7 days, congestion and pressure, denies fever     HPI:   Patient is a 35 y.o. Caucasian female, pt of Dr. Beverely Low, who is here for sinus sx's. Pt presents complaining of respiratory symptoms for 6  days.  Reports nasal congestion/runny nose, thick/green mucous, and PND cough.  Worst symptoms seems to be the facial pain, upper teeth pain diffusely, frontal headaches.  Lately the symptoms seem to be worsening.  Subjective fever last night. No wheezing, and no SOB.  Symptoms made worse by cold air, night time.  Symptoms improved by neti pot, cold/allergy meds--minimally. Smoker? no Recent sick contact? no Muscle or joint aches? No Pt is [redacted] wks pregnant. Last antibiotic course for sinusitis was approx 04/01/11, amoxil.  Says amoxil consistently brings resolution of symptoms. Has hx of recurrent sinusitis.  Has seen allergist, ENT.  Sounds like she's had about 1 sinus infection per month for at least a couple of years. CT sinuses done when NOT having infection showed normal sinuses.  CT sinuses when symptomatic was planned but then she got pregnant so this was cancelled.  ROS: no n/v/d or abdominal pain.  No rash.  No neck stiffness.   +Mild fatigue.  +Mild appetite loss.   Pertinent PMH:  Currently [redacted] wks pregnant Recurrent sinusitis (allergy testing + only for grasses). Hx of recurrent UTI/pyelo--currently on macrobid prophylaxis for this. Past Surgical History  Procedure Date  . Cesarean section   . Knee arthroscopy     right knee    MEDS;   Outpatient Prescriptions Prior to Visit  Medication Sig Dispense Refill  . Ascorbic Acid (VITAMIN C) 500 MG CAPS Take by mouth daily.        . Calcium Carbonate-Vitamin D (CALCIUM 600 + D PO) Take by mouth daily.        . cetirizine (ZYRTEC) 10 MG tablet Take 10 mg by mouth daily.        . Cholecalciferol (VITAMIN D) 1000 UNITS capsule Take 1,000 Units  by mouth daily.        . fish oil-omega-3 fatty acids 1000 MG capsule Take 1 g by mouth daily.        Marland Kitchen FOLIC ACID PO Take by mouth daily.        . IRON PO Take by mouth daily.        . Prenatal Vit-Fe Sulfate-FA (PRENATAL VITAMIN PO) Take by mouth daily.        . budesonide (RHINOCORT AQUA) 32 MCG/ACT nasal spray 2 sprays by Each Nare route daily.        Marland Kitchen ipratropium (ATROVENT) 0.03 % nasal spray 2 sprays by Each Nare route 2 (two) times daily.          PE: Blood pressure 117/78, pulse 89, temperature 98.2 F (36.8 C), temperature source Oral, height 5\' 3"  (1.6 m), weight 159 lb (72.122 kg), last menstrual period 11/30/2010, SpO2 97.00%. VS: noted--normal. Gen: alert, NAD, NONTOXIC APPEARING. HEENT: eyes without injection, drainage, or swelling.  Ears: EACs clear, TMs with normal light reflex and landmarks.  Nose:  Septum not deviated. Minimal clear rhinorrhea, with some dried, crusty exudate adherent to mildly injected mucosa.  No purulent d/c.  Diffuse paranasal sinus TTP, +frontal sinus TTP.  No facial swelling.  Throat and mouth without focal lesion.  No pharyngial swelling, erythema, or exudate.   Neck: supple, no LAD.  LUNGS: CTA bilat, nonlabored resps.   CV: RRR, no m/r/g. EXT: no c/c/e SKIN: no rash  LABS: none today  IMPRESSION AND PLAN:  Recurrent sinusitis Amoxil 875mg  bid x 14d.   Recommended she go back to her ENT (Dr. Alita Chyle) to see if she is a surgical candidate (after pregnancy). Spent 25 min with pt today, with >50 % of this time spent in counseling regarding sinus symptoms/care coordination for recurrent sinusitis.      FOLLOW UP:  Return if symptoms worsen or fail to improve.

## 2011-05-29 NOTE — Telephone Encounter (Signed)
Called [facility] for appts available - patient scheduled w md at there office [phone]

## 2011-05-29 NOTE — Telephone Encounter (Signed)
Patient has sinus infection she said she gets them all the time - wants rx called in [facility] [place]

## 2011-05-30 NOTE — Assessment & Plan Note (Signed)
Amoxil 875mg  bid x 14d.   Recommended she go back to her ENT (Dr. Alita Chyle) to see if she is a surgical candidate (after pregnancy). Spent 25 min with pt today, with >50 % of this time spent in counseling regarding sinus symptoms/care coordination for recurrent sinusitis.

## 2011-07-01 ENCOUNTER — Inpatient Hospital Stay (HOSPITAL_COMMUNITY)
Admission: AD | Admit: 2011-07-01 | Discharge: 2011-07-01 | Disposition: A | Discharge status: Home / Self Care | Payer: BC Managed Care – PPO | Source: Ambulatory Visit | Attending: Obstetrics and Gynecology | Admitting: Obstetrics and Gynecology

## 2011-07-01 ENCOUNTER — Encounter (HOSPITAL_COMMUNITY): Payer: Self-pay | Admitting: *Deleted

## 2011-07-01 DIAGNOSIS — A088 Other specified intestinal infections: Secondary | ICD-10-CM | POA: Insufficient documentation

## 2011-07-01 DIAGNOSIS — O99891 Other specified diseases and conditions complicating pregnancy: Secondary | ICD-10-CM | POA: Insufficient documentation

## 2011-07-01 DIAGNOSIS — O212 Late vomiting of pregnancy: Secondary | ICD-10-CM | POA: Insufficient documentation

## 2011-07-01 HISTORY — DX: Anemia, unspecified: D64.9

## 2011-07-01 HISTORY — DX: Unspecified abnormal cytological findings in specimens from cervix uteri: R87.619

## 2011-07-01 HISTORY — DX: Reserved for concepts with insufficient information to code with codable children: IMO0002

## 2011-07-01 LAB — COMPREHENSIVE METABOLIC PANEL
ALT: 15 U/L (ref 0–35)
AST: 23 U/L (ref 0–37)
Albumin: 2.8 g/dL — ABNORMAL LOW (ref 3.5–5.2)
Alkaline Phosphatase: 48 U/L (ref 39–117)
BUN: 10 mg/dL (ref 6–23)
CO2: 22 mEq/L (ref 19–32)
Calcium: 8.2 mg/dL — ABNORMAL LOW (ref 8.4–10.5)
Chloride: 102 mEq/L (ref 96–112)
Creatinine, Ser: 0.73 mg/dL (ref 0.50–1.10)
GFR calc Af Amer: >90 mL/min (ref >90)
GFR calc non Af Amer: >90 mL/min (ref >90)
Glucose, Bld: 95 mg/dL (ref 70–99)
Potassium: 3.2 mEq/L — ABNORMAL LOW (ref 3.5–5.1)
Sodium: 135 mEq/L (ref 135–145)
Total Bilirubin: 0.4 mg/dL (ref 0.3–1.2)
Total Protein: 6.3 g/dL (ref 6.0–8.3)

## 2011-07-01 LAB — URINALYSIS, ROUTINE W REFLEX MICROSCOPIC
Bilirubin Urine: NEGATIVE
Glucose, UA: NEGATIVE mg/dL
Hgb urine dipstick: NEGATIVE
Ketones, ur: >80 mg/dL — AB
Leukocytes, UA: NEGATIVE
Nitrite: NEGATIVE
Protein, ur: NEGATIVE mg/dL
Specific Gravity, Urine: 1.02 (ref 1.005–1.030)
Urobilinogen, UA: 0.2 mg/dL (ref 0.0–1.0)
pH: 6 (ref 5.0–8.0)

## 2011-07-01 LAB — CBC
HCT: 33 % — ABNORMAL LOW (ref 36.0–46.0)
Hemoglobin: 11.4 g/dL — ABNORMAL LOW (ref 12.0–15.0)
MCH: 31.4 pg (ref 26.0–34.0)
MCHC: 34.5 g/dL (ref 30.0–36.0)
MCV: 90.9 fL (ref 78.0–100.0)
Platelets: 147 10*3/uL — ABNORMAL LOW (ref 150–400)
RBC: 3.63 MIL/uL — ABNORMAL LOW (ref 3.87–5.11)
RDW: 13.1 % (ref 11.5–15.5)
WBC: 8.5 10*3/uL (ref 4.0–10.5)

## 2011-07-01 MED ORDER — LACTATED RINGERS IV BOLUS (SEPSIS)
1000.0000 mL | Freq: Once | INTRAVENOUS | Status: AC
  Administered 2011-07-01: 1000 mL via INTRAVENOUS

## 2011-07-01 MED ORDER — ONDANSETRON 8 MG PO TBDP: 8.0000 mg | ORAL_TABLET | Freq: Three times a day (TID) | ORAL | Status: AC | PRN

## 2011-07-01 MED ORDER — ONDANSETRON 8 MG PO TBDP: 8.0000 mg | ORAL_TABLET | Freq: Three times a day (TID) | ORAL | Status: DC | PRN

## 2011-07-01 MED ORDER — SODIUM CHLORIDE 0.9 % IV SOLN
8.0000 mg | Freq: Once | INTRAVENOUS | Status: AC
  Administered 2011-07-01: 8 mg via INTRAVENOUS
  Filled 2011-07-01: qty 4

## 2011-07-01 MED ORDER — FAMOTIDINE IN NACL 20-0.9 MG/50ML-% IV SOLN
20.0000 mg | Freq: Once | INTRAVENOUS | Status: AC
  Administered 2011-07-01: 20 mg via INTRAVENOUS
  Filled 2011-07-01: qty 50

## 2011-07-01 NOTE — Progress Notes (Signed)
Dr. Ernestina Penna notified of pt feeling better after zofran.  Notified of 2nd liter almost finished infusing. Will be up to see pt.

## 2011-07-01 NOTE — Progress Notes (Signed)
Pt states she has been having stomach pain with diarrhea and voiting since around 2130

## 2011-07-01 NOTE — Progress Notes (Signed)
Dr. Algie Coffer at bedside.  Assessment done and poc discussed with pt.

## 2011-07-01 NOTE — Progress Notes (Signed)
SSE done per MD.  VE done.

## 2011-07-01 NOTE — Progress Notes (Signed)
Pt fully discharged.  Waiting on father to get prescription filled and then return to take pt home.

## 2011-07-01 NOTE — H&P (Signed)
CC: N,V, dehyrdation, uterine cramping  HPI: 35 yo G2P1 w/ prior term c/s who presents w/ 12 hrs of N/V and unable to hold anything down. Pt notes "chills" but has not taken her temp. Also w/ diarrhea as she has had her entire preg. Pt notes freq uterine cramps in the hours prior to arrival. Pt has been having diarrhea this preg and is s/p GI eval who recc Protonix and lactobacilli. Pt has not been taking the lactobacilli and her sx have overall improved. Pt is also w/ a h/o eating d/o which she says has not been an issue. Pt notes no sick contacts.  PMH: bulemia/ anorexia SH: Husband who works on a oil rig and works 1 months shifts at a time, just left last wk.  PNI: prior c/s for fetal intol, diarrhea  PE: Filed Vitals:   07/01/11 0136 07/01/11 0210  BP: 118/74 117/71  Pulse: 98 91  Temp: 98.3 F (36.8 C)   TempSrc: Oral   Resp: 22 16  Height: 5\' 3"  (1.6 m)   Weight: 72.122 kg (159 lb)   SpO2: 96%    Repeat vitals pending Gen: curled on side, emesis bag in grip CV: RRR Pulm: CTAB Abd: no RUQ pain, gravid, NT, no rebound, no guarding, no uterine tenderness LE: no edema, NT GU: cvx long/ closed, vtx palpated through soft lower segment, non-tender, no ROM, no d/c Toco: none now, irritablt q 3 min on arrival.  FH: 150's, + accels, no decels, 10 beat var, occ times when maternal HR traces  CMP     Component Value Date/Time   NA 135 07/01/2011 0239   K 3.2* 07/01/2011 0239   CL 102 07/01/2011 0239   CO2 22 07/01/2011 0239   GLUCOSE 95 07/01/2011 0239   BUN 10 07/01/2011 0239   CREATININE 0.73 07/01/2011 0239   CALCIUM 8.2* 07/01/2011 0239   PROT 6.3 07/01/2011 0239   ALBUMIN 2.8* 07/01/2011 0239   AST 23 07/01/2011 0239   ALT 15 07/01/2011 0239   ALKPHOS 48 07/01/2011 0239   BILITOT 0.4 07/01/2011 0239   GFRNONAA >90 07/01/2011 0239   GFRAA >90 07/01/2011 0239    CBC    Component Value Date/Time   WBC 8.5 07/01/2011 0239   RBC 3.63* 07/01/2011 0239   HGB 11.4*  07/01/2011 0239   HCT 33.0* 07/01/2011 0239   PLT 147* 07/01/2011 0239   MCV 90.9 07/01/2011 0239   MCH 31.4 07/01/2011 0239   MCHC 34.5 07/01/2011 0239   RDW 13.1 07/01/2011 0239   LYMPHSABS 1.8 06/25/2010 1152   MONOABS 0.5 06/25/2010 1152   EOSABS 0.1 06/25/2010 1152   BASOSABS 0.0 06/25/2010 1152    A/P: Viral gastroenteritis, acute, no evidence PTL - 3 L IVF, recc home hydration, cont protonix, start Zofran (warned about constipation and OTC meds recc) - reactive fetal testing  Mescal Flinchbaugh A. 07/01/2011 5:15 AM

## 2011-07-01 NOTE — Progress Notes (Signed)
Maternal tracing noted.  [place] adjusted.

## 2011-07-01 NOTE — Progress Notes (Signed)
D5LR hanging IVPB per md verbal order.

## 2011-07-01 NOTE — Progress Notes (Signed)
Ok to [employer] cefm at this time.

## 2011-07-01 NOTE — Progress Notes (Signed)
Pt c/o contractions throughout the day, N&V &D since 2100 tonight.  States she has had chills at home but no identified fever.

## 2011-07-22 ENCOUNTER — Encounter: Payer: Self-pay | Admitting: Family Medicine

## 2011-07-22 ENCOUNTER — Ambulatory Visit (INDEPENDENT_AMBULATORY_CARE_PROVIDER_SITE_OTHER): Payer: BC Managed Care – PPO | Admitting: Family Medicine

## 2011-07-22 VITALS — BP 110/80 | HR 98 | Temp 97.2°F | Ht 62.75 in | Wt 159.0 lb

## 2011-07-22 DIAGNOSIS — J329 Chronic sinusitis, unspecified: Secondary | ICD-10-CM

## 2011-07-22 NOTE — Progress Notes (Signed)
  Subjective:    Patient ID: Kelsey [name], female    DOB: [birth year], 36 y.o.   MRN: [id number]  HPI Recurrent sinusitis- saw OB last Wed and had discussion about long term effects of repeat use of abx on fetus.  Not interested in using abx today but wants to see ENT.  Is reconsidering sinus surgery.  Previously saw [facility]- not interested in seeing any other docs in practice.   Review of Systems For ROS see HPI     Objective:   Physical Exam  Vitals reviewed. Constitutional: She appears well-developed and well-nourished. No distress.  HENT:  Head: Normocephalic and atraumatic.  Right Ear: Tympanic membrane normal.  Left Ear: Tympanic membrane normal.  Nose: Mucosal edema and rhinorrhea present. Right sinus exhibits no maxillary sinus tenderness and no frontal sinus tenderness. Left sinus exhibits no maxillary sinus tenderness and no frontal sinus tenderness.  Mouth/Throat: Mucous membranes are normal. Posterior oropharyngeal erythema (w/ PND) present.  Eyes: Conjunctivae and EOM are normal. Pupils are equal, round, and reactive to light.  Neck: Normal range of motion. Neck supple.  Cardiovascular: Normal rate, regular rhythm and normal heart sounds.   Pulmonary/Chest: Effort normal and breath sounds normal. No respiratory distress. She has no wheezes. She has no rales.  Lymphadenopathy:    She has no cervical adenopathy.          Assessment & Plan:

## 2011-07-22 NOTE — Patient Instructions (Signed)
We'll call you with your ENT appt Call when you're ready to see podiatry- we'll make the referral Call with any questions or concerns Hang in there!  You look great!

## 2011-08-04 NOTE — Assessment & Plan Note (Signed)
Pt would like 2nd opinion from ENT as what to do about her recurrent sinus infxns.  Will refer.

## 2011-08-26 ENCOUNTER — Other Ambulatory Visit: Payer: Self-pay | Admitting: Obstetrics

## 2011-08-27 ENCOUNTER — Encounter (HOSPITAL_COMMUNITY): Payer: Self-pay | Admitting: Pharmacy Technician

## 2011-08-30 ENCOUNTER — Encounter (HOSPITAL_COMMUNITY)
Admission: RE | Admit: 2011-08-30 | Discharge: 2011-08-30 | Disposition: A | Discharge status: Home / Self Care | Payer: 59 | Source: Ambulatory Visit | Attending: Obstetrics | Admitting: Obstetrics

## 2011-08-30 ENCOUNTER — Encounter (HOSPITAL_COMMUNITY): Payer: Self-pay

## 2011-08-30 HISTORY — DX: Unspecified disorder of nose and nasal sinuses: J34.9

## 2011-08-30 HISTORY — DX: Headache: R51

## 2011-08-30 LAB — BASIC METABOLIC PANEL
BUN: 9 mg/dL (ref 6–23)
CO2: 27 mEq/L (ref 19–32)
Calcium: 9.1 mg/dL (ref 8.4–10.5)
Chloride: 101 mEq/L (ref 96–112)
Creatinine, Ser: 0.69 mg/dL (ref 0.50–1.10)
GFR calc Af Amer: >90 mL/min (ref >90)
GFR calc non Af Amer: >90 mL/min (ref >90)
Glucose, Bld: 82 mg/dL (ref 70–99)
Potassium: 3.8 mEq/L (ref 3.5–5.1)
Sodium: 135 mEq/L (ref 135–145)

## 2011-08-30 LAB — CBC
HCT: 33.3 % — ABNORMAL LOW (ref 36.0–46.0)
Hemoglobin: 11.3 g/dL — ABNORMAL LOW (ref 12.0–15.0)
MCH: 31 pg (ref 26.0–34.0)
MCHC: 33.9 g/dL (ref 30.0–36.0)
MCV: 91.5 fL (ref 78.0–100.0)
Platelets: 148 10*3/uL — ABNORMAL LOW (ref 150–400)
RBC: 3.64 MIL/uL — ABNORMAL LOW (ref 3.87–5.11)
RDW: 14 % (ref 11.5–15.5)
WBC: 7.3 10*3/uL (ref 4.0–10.5)

## 2011-08-30 LAB — SURGICAL PCR SCREEN
MRSA, PCR: NEGATIVE
Staphylococcus aureus: NEGATIVE

## 2011-08-30 NOTE — Patient Instructions (Addendum)
20 Kelsey [name]  08/30/2011   Your procedure is scheduled on:  08/31/11  Enter through the Main Entrance of [facility] at 930 AM.  Pick up the phone at the desk and dial [phone].   Call this number if you have problems the morning of surgery: [phone]   Remember:   Do not eat food:After Midnight.  Do not drink clear liquids: After Midnight.  Take these medicines the morning of surgery with A SIP OF WATER: Protonix   Do not wear jewelry, make-up or nail polish.  Do not wear lotions, powders, or perfumes. You may wear deodorant.  Do not shave 48 hours prior to surgery.  Do not bring valuables to the hospital.  Contacts, dentures or bridgework may not be worn into surgery.  Leave suitcase in the car. After surgery it may be brought to your room.  For patients admitted to the hospital, checkout time is 11:00 AM the day of discharge.   Patients discharged the day of surgery will not be allowed to drive home.  Name and phone number of your driver: NA  Special Instructions: CHG Shower Use Special Wash: 1/2 bottle night before surgery and 1/2 bottle morning of surgery.   Please read over the following fact sheets that you were given: MRSA Information

## 2011-08-31 ENCOUNTER — Encounter (HOSPITAL_COMMUNITY)
Admission: RE | Disposition: A | Discharge status: Home / Self Care | Payer: Self-pay | Source: Ambulatory Visit | Attending: Obstetrics

## 2011-08-31 ENCOUNTER — Encounter (HOSPITAL_COMMUNITY): Payer: Self-pay | Admitting: Anesthesiology

## 2011-08-31 ENCOUNTER — Inpatient Hospital Stay (HOSPITAL_COMMUNITY): Payer: 59 | Admitting: Anesthesiology

## 2011-08-31 ENCOUNTER — Inpatient Hospital Stay (HOSPITAL_COMMUNITY)
Admission: RE | Admit: 2011-08-31 | Discharge: 2011-09-02 | DRG: 765 | Disposition: A | Discharge status: Home / Self Care | Payer: 59 | Source: Ambulatory Visit | Attending: Obstetrics | Admitting: Obstetrics

## 2011-08-31 ENCOUNTER — Encounter (HOSPITAL_COMMUNITY): Payer: Self-pay | Admitting: Pediatrics

## 2011-08-31 ENCOUNTER — Encounter (HOSPITAL_COMMUNITY): Payer: Self-pay

## 2011-08-31 DIAGNOSIS — Z01818 Encounter for other preprocedural examination: Secondary | ICD-10-CM

## 2011-08-31 DIAGNOSIS — Z01812 Encounter for preprocedural laboratory examination: Secondary | ICD-10-CM

## 2011-08-31 DIAGNOSIS — Z22330 Carrier of Group B streptococcus: Secondary | ICD-10-CM

## 2011-08-31 DIAGNOSIS — O99892 Other specified diseases and conditions complicating childbirth: Secondary | ICD-10-CM | POA: Diagnosis present

## 2011-08-31 DIAGNOSIS — D62 Acute posthemorrhagic anemia: Secondary | ICD-10-CM | POA: Diagnosis not present

## 2011-08-31 DIAGNOSIS — O9903 Anemia complicating the puerperium: Secondary | ICD-10-CM | POA: Diagnosis not present

## 2011-08-31 DIAGNOSIS — O34219 Maternal care for unspecified type scar from previous cesarean delivery: Principal | ICD-10-CM | POA: Diagnosis present

## 2011-08-31 LAB — URINALYSIS, ROUTINE W REFLEX MICROSCOPIC
Bilirubin Urine: NEGATIVE
Glucose, UA: NEGATIVE mg/dL
Hgb urine dipstick: NEGATIVE
Ketones, ur: NEGATIVE mg/dL
Leukocytes, UA: NEGATIVE
Nitrite: NEGATIVE
Protein, ur: NEGATIVE mg/dL
Specific Gravity, Urine: <1.005 — ABNORMAL LOW (ref 1.005–1.030)
Urobilinogen, UA: 0.2 mg/dL (ref 0.0–1.0)
pH: 7 (ref 5.0–8.0)

## 2011-08-31 LAB — RPR: RPR Ser Ql: NONREACTIVE

## 2011-08-31 SURGERY — Surgical Case
Anesthesia: Spinal | Site: Abdomen | Wound class: Clean Contaminated

## 2011-08-31 MED ORDER — SODIUM CHLORIDE 0.9 % IJ SOLN: 3.0000 mL | INTRAMUSCULAR | Status: DC | PRN

## 2011-08-31 MED ORDER — OXYTOCIN 10 UNIT/ML IJ SOLN
INTRAMUSCULAR | Status: AC
  Filled 2011-08-31: qty 4

## 2011-08-31 MED ORDER — MENTHOL 3 MG MT LOZG: 1.0000 | LOZENGE | OROMUCOSAL | Status: DC | PRN

## 2011-08-31 MED ORDER — ZOLPIDEM TARTRATE 5 MG PO TABS: 5.0000 mg | ORAL_TABLET | Freq: Every evening | ORAL | Status: DC | PRN

## 2011-08-31 MED ORDER — BUPIVACAINE IN DEXTROSE 0.75-8.25 % IT SOLN
INTRATHECAL | Status: DC | PRN
  Administered 2011-08-31: 10.4 mg via INTRATHECAL

## 2011-08-31 MED ORDER — MEPERIDINE HCL 25 MG/ML IJ SOLN: 6.2500 mg | INTRAMUSCULAR | Status: DC | PRN

## 2011-08-31 MED ORDER — ONDANSETRON HCL 4 MG/2ML IJ SOLN
INTRAMUSCULAR | Status: AC
  Filled 2011-08-31: qty 2

## 2011-08-31 MED ORDER — FENTANYL CITRATE 0.05 MG/ML IJ SOLN: 25.0000 ug | INTRAMUSCULAR | Status: DC | PRN

## 2011-08-31 MED ORDER — DIPHENHYDRAMINE HCL 25 MG PO CAPS: 25.0000 mg | ORAL_CAPSULE | ORAL | Status: DC | PRN

## 2011-08-31 MED ORDER — SCOPOLAMINE 1 MG/3DAYS TD PT72
MEDICATED_PATCH | TRANSDERMAL | Status: AC
  Administered 2011-08-31: 1.5 mg via TRANSDERMAL
  Filled 2011-08-31: qty 1

## 2011-08-31 MED ORDER — EPHEDRINE 5 MG/ML INJ
INTRAVENOUS | Status: AC
  Filled 2011-08-31: qty 10

## 2011-08-31 MED ORDER — DIPHENHYDRAMINE HCL 50 MG/ML IJ SOLN: 25.0000 mg | INTRAMUSCULAR | Status: DC | PRN

## 2011-08-31 MED ORDER — OXYTOCIN 20 UNITS IN LACTATED RINGERS INFUSION - SIMPLE
INTRAVENOUS | Status: DC | PRN
  Administered 2011-08-31: 20 [IU] via INTRAVENOUS

## 2011-08-31 MED ORDER — SCOPOLAMINE 1 MG/3DAYS TD PT72
1.0000 | MEDICATED_PATCH | TRANSDERMAL | Status: DC
  Administered 2011-08-31: 1.5 mg via TRANSDERMAL

## 2011-08-31 MED ORDER — SIMETHICONE 80 MG PO CHEW: 80.0000 mg | CHEWABLE_TABLET | ORAL | Status: DC | PRN

## 2011-08-31 MED ORDER — FENTANYL CITRATE 0.05 MG/ML IJ SOLN
INTRAMUSCULAR | Status: AC
  Filled 2011-08-31: qty 2

## 2011-08-31 MED ORDER — IBUPROFEN 600 MG PO TABS
600.0000 mg | ORAL_TABLET | Freq: Four times a day (QID) | ORAL | Status: DC
  Administered 2011-09-01 – 2011-09-02 (×6): 600 mg via ORAL
  Filled 2011-08-31 (×2): qty 1

## 2011-08-31 MED ORDER — SIMETHICONE 80 MG PO CHEW
80.0000 mg | CHEWABLE_TABLET | Freq: Three times a day (TID) | ORAL | Status: DC
  Administered 2011-08-31 – 2011-09-02 (×6): 80 mg via ORAL

## 2011-08-31 MED ORDER — OXYTOCIN 10 UNIT/ML IJ SOLN
INTRAMUSCULAR | Status: AC
  Filled 2011-08-31: qty 2

## 2011-08-31 MED ORDER — OXYTOCIN 20 UNITS IN LACTATED RINGERS INFUSION - SIMPLE
INTRAVENOUS | Status: AC
  Administered 2011-08-31: 20 [IU]
  Filled 2011-08-31: qty 1000

## 2011-08-31 MED ORDER — DIPHENHYDRAMINE HCL 25 MG PO CAPS: 25.0000 mg | ORAL_CAPSULE | Freq: Four times a day (QID) | ORAL | Status: DC | PRN

## 2011-08-31 MED ORDER — CEFAZOLIN SODIUM 1-5 GM-% IV SOLN
INTRAVENOUS | Status: AC
  Filled 2011-08-31: qty 50

## 2011-08-31 MED ORDER — DIPHENHYDRAMINE HCL 50 MG/ML IJ SOLN: 12.5000 mg | INTRAMUSCULAR | Status: DC | PRN

## 2011-08-31 MED ORDER — BUPIVACAINE HCL (PF) 0.25 % IJ SOLN
INTRAMUSCULAR | Status: AC
  Filled 2011-08-31: qty 30

## 2011-08-31 MED ORDER — OXYCODONE-ACETAMINOPHEN 5-325 MG PO TABS
1.0000 | ORAL_TABLET | ORAL | Status: DC | PRN
  Administered 2011-09-01 – 2011-09-02 (×7): 1 via ORAL
  Filled 2011-08-31 (×7): qty 1

## 2011-08-31 MED ORDER — SCOPOLAMINE 1 MG/3DAYS TD PT72
1.0000 | MEDICATED_PATCH | Freq: Once | TRANSDERMAL | Status: DC
  Filled 2011-08-31: qty 1

## 2011-08-31 MED ORDER — SODIUM CHLORIDE 0.9 % IV SOLN
1.0000 ug/kg/h | INTRAVENOUS | Status: DC | PRN
  Filled 2011-08-31: qty 2.5

## 2011-08-31 MED ORDER — ONDANSETRON HCL 4 MG/2ML IJ SOLN
INTRAMUSCULAR | Status: DC | PRN
  Administered 2011-08-31: 4 mg via INTRAVENOUS

## 2011-08-31 MED ORDER — PRENATAL MULTIVITAMIN CH
1.0000 | ORAL_TABLET | Freq: Every day | ORAL | Status: DC
  Administered 2011-09-01 – 2011-09-02 (×2): 1 via ORAL
  Filled 2011-08-31 (×2): qty 1

## 2011-08-31 MED ORDER — LACTATED RINGERS IV SOLN
INTRAVENOUS | Status: DC
  Administered 2011-08-31 (×2): 125 mL/h via INTRAVENOUS
  Administered 2011-08-31: 13:00:00 via INTRAVENOUS

## 2011-08-31 MED ORDER — KETOROLAC TROMETHAMINE 30 MG/ML IJ SOLN: 30.0000 mg | Freq: Four times a day (QID) | INTRAMUSCULAR | Status: DC | PRN

## 2011-08-31 MED ORDER — MORPHINE SULFATE 0.5 MG/ML IJ SOLN
INTRAMUSCULAR | Status: AC
  Filled 2011-08-31: qty 10

## 2011-08-31 MED ORDER — EPHEDRINE SULFATE 50 MG/ML IJ SOLN
INTRAMUSCULAR | Status: DC | PRN
  Administered 2011-08-31: 10 mg via INTRAVENOUS

## 2011-08-31 MED ORDER — KETOROLAC TROMETHAMINE 60 MG/2ML IM SOLN
60.0000 mg | Freq: Once | INTRAMUSCULAR | Status: AC | PRN
  Administered 2011-08-31: 60 mg via INTRAMUSCULAR

## 2011-08-31 MED ORDER — OXYTOCIN 20 UNITS IN LACTATED RINGERS INFUSION - SIMPLE
125.0000 mL/h | INTRAVENOUS | Status: AC
  Administered 2011-08-31: 125 mL/h via INTRAVENOUS
  Filled 2011-08-31: qty 1000

## 2011-08-31 MED ORDER — LANOLIN HYDROUS EX OINT: 1.0000 "application " | TOPICAL_OINTMENT | CUTANEOUS | Status: DC | PRN

## 2011-08-31 MED ORDER — WITCH HAZEL-GLYCERIN EX PADS: 1.0000 "application " | MEDICATED_PAD | CUTANEOUS | Status: DC | PRN

## 2011-08-31 MED ORDER — NITROFURANTOIN MONOHYD MACRO 100 MG PO CAPS
100.0000 mg | ORAL_CAPSULE | Freq: Every day | ORAL | Status: DC
  Administered 2011-09-01 – 2011-09-02 (×2): 100 mg via ORAL
  Filled 2011-08-31 (×4): qty 1

## 2011-08-31 MED ORDER — KETOROLAC TROMETHAMINE 30 MG/ML IJ SOLN
30.0000 mg | Freq: Four times a day (QID) | INTRAMUSCULAR | Status: DC | PRN
  Administered 2011-08-31: 30 mg via INTRAVENOUS
  Filled 2011-08-31: qty 1

## 2011-08-31 MED ORDER — SENNOSIDES-DOCUSATE SODIUM 8.6-50 MG PO TABS
2.0000 | ORAL_TABLET | Freq: Every day | ORAL | Status: DC
  Administered 2011-08-31 – 2011-09-01 (×2): 2 via ORAL

## 2011-08-31 MED ORDER — KETOROLAC TROMETHAMINE 60 MG/2ML IM SOLN
INTRAMUSCULAR | Status: AC
  Administered 2011-08-31: 60 mg via INTRAMUSCULAR
  Filled 2011-08-31: qty 2

## 2011-08-31 MED ORDER — CEFAZOLIN SODIUM 1-5 GM-% IV SOLN
1.0000 g | INTRAVENOUS | Status: AC
  Administered 2011-08-31: 1 g via INTRAVENOUS

## 2011-08-31 MED ORDER — ONDANSETRON HCL 4 MG/2ML IJ SOLN: 4.0000 mg | INTRAMUSCULAR | Status: DC | PRN

## 2011-08-31 MED ORDER — MORPHINE SULFATE (PF) 0.5 MG/ML IJ SOLN
INTRAMUSCULAR | Status: DC | PRN
  Administered 2011-08-31: .2 mg via INTRATHECAL

## 2011-08-31 MED ORDER — PANTOPRAZOLE SODIUM 40 MG PO TBEC
40.0000 mg | DELAYED_RELEASE_TABLET | Freq: Every day | ORAL | Status: DC
  Administered 2011-09-01 – 2011-09-02 (×2): 40 mg via ORAL
  Filled 2011-08-31 (×4): qty 1

## 2011-08-31 MED ORDER — PROMETHAZINE HCL 25 MG/ML IJ SOLN: 6.2500 mg | INTRAMUSCULAR | Status: DC | PRN

## 2011-08-31 MED ORDER — KETOROLAC TROMETHAMINE 30 MG/ML IJ SOLN: 15.0000 mg | Freq: Once | INTRAMUSCULAR | Status: DC | PRN

## 2011-08-31 MED ORDER — NALBUPHINE HCL 10 MG/ML IJ SOLN
5.0000 mg | INTRAMUSCULAR | Status: DC | PRN
  Filled 2011-08-31: qty 1

## 2011-08-31 MED ORDER — PHENYLEPHRINE HCL 10 MG/ML IJ SOLN
INTRAMUSCULAR | Status: DC | PRN
  Administered 2011-08-31 (×2): 80 ug via INTRAVENOUS

## 2011-08-31 MED ORDER — ONDANSETRON HCL 4 MG/2ML IJ SOLN: 4.0000 mg | Freq: Three times a day (TID) | INTRAMUSCULAR | Status: DC | PRN

## 2011-08-31 MED ORDER — DIBUCAINE 1 % RE OINT: 1.0000 "application " | TOPICAL_OINTMENT | RECTAL | Status: DC | PRN

## 2011-08-31 MED ORDER — IBUPROFEN 600 MG PO TABS
600.0000 mg | ORAL_TABLET | Freq: Four times a day (QID) | ORAL | Status: DC | PRN
  Filled 2011-08-31 (×4): qty 1

## 2011-08-31 MED ORDER — PHENYLEPHRINE 40 MCG/ML (10ML) SYRINGE FOR IV PUSH (FOR BLOOD PRESSURE SUPPORT)
PREFILLED_SYRINGE | INTRAVENOUS | Status: AC
  Filled 2011-08-31: qty 5

## 2011-08-31 MED ORDER — BUPIVACAINE HCL (PF) 0.25 % IJ SOLN
INTRAMUSCULAR | Status: DC | PRN
  Administered 2011-08-31: 10 mL

## 2011-08-31 MED ORDER — LACTATED RINGERS IV SOLN: INTRAVENOUS | Status: DC

## 2011-08-31 MED ORDER — NALOXONE HCL 0.4 MG/ML IJ SOLN: 0.4000 mg | INTRAMUSCULAR | Status: DC | PRN

## 2011-08-31 MED ORDER — TETANUS-DIPHTH-ACELL PERTUSSIS 5-2.5-18.5 LF-MCG/0.5 IM SUSP
0.5000 mL | Freq: Once | INTRAMUSCULAR | Status: AC
  Administered 2011-09-01: 0.5 mL via INTRAMUSCULAR
  Filled 2011-08-31: qty 0.5

## 2011-08-31 MED ORDER — FENTANYL CITRATE 0.05 MG/ML IJ SOLN
INTRAMUSCULAR | Status: DC | PRN
  Administered 2011-08-31: 25 ug via INTRATHECAL

## 2011-08-31 MED ORDER — ONDANSETRON HCL 4 MG PO TABS: 4.0000 mg | ORAL_TABLET | ORAL | Status: DC | PRN

## 2011-08-31 SURGICAL SUPPLY — 36 items
CHLORAPREP W/TINT 26ML (MISCELLANEOUS) ×2 IMPLANT
CLOTH BEACON ORANGE TIMEOUT ST (SAFETY) ×2 IMPLANT
CONTAINER PREFILL 10% NBF 15ML (MISCELLANEOUS) IMPLANT
DRSG COVADERM 4X10 (GAUZE/BANDAGES/DRESSINGS) ×2 IMPLANT
DRSG PAD ABDOMINAL 8X10 ST (GAUZE/BANDAGES/DRESSINGS) ×2 IMPLANT
ELECT REM PT RETURN 9FT ADLT (ELECTROSURGICAL) ×2
ELECTRODE REM PT RTRN 9FT ADLT (ELECTROSURGICAL) ×1 IMPLANT
EXTRACTOR VACUUM KIWI (MISCELLANEOUS) IMPLANT
EXTRACTOR VACUUM M CUP 4 TUBE (SUCTIONS) IMPLANT
GLOVE BIO SURGEON STRL SZ 6.5 (GLOVE) ×2 IMPLANT
GLOVE BIOGEL PI IND STRL 7.0 (GLOVE) ×2 IMPLANT
GLOVE BIOGEL PI INDICATOR 7.0 (GLOVE) ×2
GLOVE ECLIPSE 6.5 STRL STRAW (GLOVE) ×4 IMPLANT
GLOVE SURG SS PI 7.5 STRL IVOR (GLOVE) ×4 IMPLANT
GOWN PREVENTION PLUS LG XLONG (DISPOSABLE) ×6 IMPLANT
KIT ABG SYR 3ML LUER SLIP (SYRINGE) IMPLANT
NEEDLE HYPO 25X5/8 SAFETYGLIDE (NEEDLE) ×2 IMPLANT
NS IRRIG 1000ML POUR BTL (IV SOLUTION) ×2 IMPLANT
PACK C SECTION WH (CUSTOM PROCEDURE TRAY) ×2 IMPLANT
SLEEVE SCD COMPRESS KNEE MED (MISCELLANEOUS) IMPLANT
STAPLER VISISTAT 35W (STAPLE) ×2 IMPLANT
STRIP CLOSURE SKIN 1/2X4 (GAUZE/BANDAGES/DRESSINGS) IMPLANT
SUT MON AB 4-0 PS1 27 (SUTURE) IMPLANT
SUT PLAIN 0 NONE (SUTURE) IMPLANT
SUT PLAIN 2 0 (SUTURE) ×1
SUT PLAIN 2 0 XLH (SUTURE) IMPLANT
SUT PLAIN ABS 2-0 CT1 27XMFL (SUTURE) ×1 IMPLANT
SUT VIC AB 0 CT1 36 (SUTURE) ×4 IMPLANT
SUT VIC AB 0 CTX 36 (SUTURE) ×3
SUT VIC AB 0 CTX36XBRD ANBCTRL (SUTURE) ×3 IMPLANT
SUT VIC AB 2-0 CT1 27 (SUTURE) ×2
SUT VIC AB 2-0 CT1 TAPERPNT 27 (SUTURE) ×2 IMPLANT
TAPE CLOTH SURG 4X10 WHT LF (GAUZE/BANDAGES/DRESSINGS) ×2 IMPLANT
TOWEL OR 17X24 6PK STRL BLUE (TOWEL DISPOSABLE) ×4 IMPLANT
TRAY FOLEY CATH 14FR (SET/KITS/TRAYS/PACK) ×2 IMPLANT
WATER STERILE IRR 1000ML POUR (IV SOLUTION) ×2 IMPLANT

## 2011-08-31 NOTE — Anesthesia Preprocedure Evaluation (Signed)
Anesthesia Evaluation  Patient identified by MRN, date of birth, ID band Patient awake    Reviewed: Allergy & Precautions, H&P , NPO status , Patient's Chart, lab work & pertinent test results  Airway Mallampati: I TM Distance: >3 FB Neck ROM: full    Dental No notable dental hx.    Pulmonary neg pulmonary ROS,    Pulmonary exam normal       Cardiovascular neg cardio ROS     Neuro/Psych Negative Psych ROS   GI/Hepatic negative GI ROS, Neg liver ROS,   Endo/Other  Negative Endocrine ROS  Renal/GU negative Renal ROS  Genitourinary negative   Musculoskeletal negative musculoskeletal ROS (+)   Abdominal Normal abdominal exam  (+)   Peds negative pediatric ROS (+)  Hematology negative hematology ROS (+)   Anesthesia Other Findings   Reproductive/Obstetrics (+) Pregnancy                           Anesthesia Physical Anesthesia Plan  ASA: II  Anesthesia Plan: Spinal   Post-op Pain Management:    Induction:   Airway Management Planned:   Additional Equipment:   Intra-op Plan:   Post-operative Plan:   Informed Consent: I have reviewed the patients History and Physical, chart, labs and discussed the procedure including the risks, benefits and alternatives for the proposed anesthesia with the patient or authorized representative who has indicated his/her understanding and acceptance.     Plan Discussed with: CRNA  Anesthesia Plan Comments:         Anesthesia Quick Evaluation

## 2011-08-31 NOTE — Op Note (Signed)
Preoperative diagnosis: prior c/s, 39 wks IUP  Postop diagnosis:  same Procedure: RCS Surgeon: Viviann Spare Assistant:. Fredric Mare Anesthesia: local/ spinal Findings:  @BABYSEXEBC @ infant,  APGAR (1 MIN): 9   APGAR (5 MINS): 9   APGAR (10 MINS):   @BABYWGTLBSEBC @ @BABYWGTOZEBC @ Unknown at this time EBL: per anasthesia cc Antibiotics:  1g Ancef  Complications: none  Indications: This is a 36 y.o. year-old, G2P1  At [redacted]w[redacted]d admitted for elective RCS. Risks benefits and alternatives of the procedure were discussed with the patient who agreed to proceed  Procedure:  After informed consent was obtained the patient was taken to the operating room where spinal anesthesia was initiated.  She was prepped and draped in the normal sterile fashion in dorsal supine position with a leftward tilt.  A foley catheter was inserted sterilely into the bladder. Gloves were changes and attention was turned to the patient's abdomen. 10 cc of 1/4% marcaine were infused. A Pfannenstiel skin incision was made 2 cm above the pubic symphysis in the midline with the scalpel.  Dissection was carried down with the Bovie cautery until the fascia was reached. The fascia was incised in the midline. The incision was extended laterally with the Mayo scissors. The inferior aspect of the fascial incision was grasped with the Coker clamps, elevated up and the underlying rectus muscles were dissected off sharply. The superior aspect of the fascial incision was grasped with the Coker clamps elevated up and the underlying rectus muscles were dissected off sharply.  The peritoneum was entered sharply during fascial dissection. The peritoneal incision was extended superiorly and inferiorly with good visualization of the bladder. The bladder blade was inserted, the vesicouterine peritoneum was identified grasped with the pickups and entered sharply. The bladder was adhesed quite high on the uturus but adhesions were filmy and easily released.The  bladder flap was created digitally the bladder blade was reinserted. Palpation was done to assess the fetal position and the location of the uterine vessels. The lower segment of the uterus was incised sharply with the scalpel and extended superiorly and laterally with the bandage scissors. The infant also was grasped brought to the incision,  rotated and the infant was delivered with fundal pressure. The nose and mouth were bulb suctioned. The cord was clamped and cut. The infant was handed off to the waiting pediatrician. The placenta was expressed. The uterus was exteriorized. The uterus was cleared of all clots and debris. The uterine incision was repaired with 0 Vicryl in a running locked fashion.  A second layer of the same suture was used in an imbricating fashion to obtain excellent hemostasis. A single additional figure of 8 was needed at the L apex.  The uterus was then returned to the abdomen, the gutters were cleared of all clots and debris. The uterine incision was reinspected and found to be hemostatic. The peritoneum was grasped and closed with 2-0 Vicryl in a running fashion. The cut muscle edges and the underside of the fascia were inspected and found to be hemostatic. The fascia was closed with 0 Vicryl in two halves. The subcutaneous tissue was irrigated. Scarpa's layer was closed with a 2-0 plain gut suture. The skin was closed with staples. The patient tolerated the procedure well. Sponge lap and needle counts were correct x3 and patient was taken to the recovery room in a stable condition.  Ayiden Milliman A. 08/31/2011 1:26 PM

## 2011-08-31 NOTE — Anesthesia Postprocedure Evaluation (Signed)
Anesthesia Post Note  Patient: Kelsey [name]  Procedure(s) Performed: Procedure(s) (LRB): CESAREAN SECTION ([name])  Anesthesia type: Spinal  Patient location: PACU  Post pain: Pain level controlled  Post assessment: Post-op Vital signs reviewed  Last Vitals:  Filed Vitals:   08/31/11 1400  BP:   Pulse: 65  Temp:   Resp: 20    Post vital signs: Reviewed  Level of consciousness: awake  Complications: No apparent anesthesia complications

## 2011-08-31 NOTE — Anesthesia Procedure Notes (Signed)
Spinal  Patient location during procedure: OR Start time: 08/31/2011 12:30 PM End time: 08/31/2011 12:33 PM Staffing Anesthesiologist: Sandrea Hughs Performed by: anesthesiologist  Preanesthetic Checklist Completed: patient identified, site marked, surgical consent, pre-op evaluation, timeout performed, IV checked, risks and benefits discussed and monitors and equipment checked Spinal Block Patient position: sitting Prep: DuraPrep Patient monitoring: heart rate, cardiac monitor, continuous pulse ox and blood pressure Approach: midline Location: L3-4 Injection technique: single-shot Needle Needle type: Sprotte  Needle gauge: 24 G Needle length: 9 cm Assessment Sensory level: T10

## 2011-08-31 NOTE — Brief Op Note (Signed)
08/31/2011  1:25 PM  PATIENT:  Kelsey [name]  36 y.o. female  PRE-OPERATIVE DIAGNOSIS:  Previous Cesarean Section  POST-OPERATIVE DIAGNOSIS:  previous cesarean section  PROCEDURE:  Procedure(s) (LRB): CESAREAN SECTION ([name])  SURGEON:  Surgeon(s) and Role:    Tresa Endo A. Ernestina Penna, MD - Primary  PHYSICIAN ASSISTANT:   ASSISTANTS: Fredric Mare, CNM   ANESTHESIA:   spinal  EBL:  Total I/O In: 3000 [I.V.:3000] Out: 850 [Urine:150; Blood:700]  BLOOD ADMINISTERED:none  DRAINS: Urinary Catheter (Foley)   LOCAL MEDICATIONS USED:  MARCAINE     SPECIMEN:  Source of Specimen:  placenta  DISPOSITION OF SPECIMEN:  L&D  COUNTS:  YES  TOURNIQUET:  * No tourniquets in log *  DICTATION: .Note written in EPIC  PLAN OF CARE: Admit to inpatient   PATIENT DISPOSITION:  PACU - hemodynamically stable.   Delay start of Pharmacological VTE agent (>24hrs) due to surgical blood loss or risk of bleeding: yes

## 2011-08-31 NOTE — H&P (Addendum)
Kelsey [name] is a 36 y.o. G2P1001 at [redacted]w[redacted]d presenting for elective RCS. Pt notes occasional contractions . Good fetal movement, No vaginal bleeding, not leaking fluid.  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 Abnormal Pap smear   . Anemia   . Chronic sinusitis   . Pyelonephritis 2008    Pylo and stones in the past  . Sinus problem     sinusitis  . Headache    Past Surgical History  Procedure Date  . Cesarean section   . Knee arthroscopy     right knee  . Colposcopy   . Wisdom tooth extraction    Family History: family history includes Asthma in her brother; Coronary artery disease in her maternal grandfather; Diabetes in her maternal grandfather; Heart disease in her brother; Heart disease (age of onset:53) in her maternal grandfather; and Kidney disease in her maternal grandfather.  There is no history of Cancer, and Stroke, and Hypertension, . Social History:  reports that she has never smoked. She has never used smokeless tobacco. She reports that she does not drink alcohol or use illicit drugs.  Review of Systems - Negative except contractions     Blood pressure 121/75, pulse 88, temperature 98.4 F (36.9 C), resp. rate 16, last menstrual period 11/30/2010, SpO2 100.00%.  Physical Exam:  Gen: well appearing, no distress CV: RRR Pulm: CTAB Back: no CVAT Abd: gravid, NT, no RUQ pain LE: no edema, equal bilaterally, non-tender Toco: n/a FH: +FH  Prenatal labs: ABO, Rh: --/--/O NEG (02/23 1009) Antibody: POS (02/23 1009) Rubella:  immune RPR: NON REACTIVE (02/22  1003)  HBsAg: Negative (08/12 1427)  HIV: Non-reactive (08/12 1427)     Assessment/Plan: 36 y.o. G2P1001 at [redacted]w[redacted]d Plan elective RCS, pt aware R/B Watch Qt while in OR   Ogechi Kuehnel A. 08/31/2011, 11:20 AM  Heb B neg, RI, AFP neg, nl NT, HIV neg Also w/ GERD, on Protonix Freq diarrhea in preg Early Previa, resolved by 18 wks GBS pos

## 2011-08-31 NOTE — Transfer of Care (Signed)
Immediate Anesthesia Transfer of Care Note  Patient: Kelsey [name]  Procedure(s) Performed: Procedure(s) (LRB): CESAREAN SECTION ([name])  Patient Location: PACU  Anesthesia Type: Spinal  Level of Consciousness: alert  and oriented  Airway & Oxygen Therapy: Patient Spontanous Breathing  Post-op Assessment: Report given to PACU RN and Post -op Vital signs reviewed and stable  Post vital signs: stable  Complications: No apparent anesthesia complications

## 2011-09-01 LAB — CBC
HCT: 29.3 % — ABNORMAL LOW (ref 36.0–46.0)
Hemoglobin: 10 g/dL — ABNORMAL LOW (ref 12.0–15.0)
MCH: 31.2 pg (ref 26.0–34.0)
MCHC: 34.1 g/dL (ref 30.0–36.0)
MCV: 91.3 fL (ref 78.0–100.0)
Platelets: 138 10*3/uL — ABNORMAL LOW (ref 150–400)
RBC: 3.21 MIL/uL — ABNORMAL LOW (ref 3.87–5.11)
RDW: 13.7 % (ref 11.5–15.5)
WBC: 12.3 10*3/uL — ABNORMAL HIGH (ref 4.0–10.5)

## 2011-09-01 NOTE — Progress Notes (Signed)
Patient ID: Kelsey [name], female   DOB: 1975/10/06, 36 y.o.   MRN: [id number]  POSTOPERATIVE DAY # 1 S/P cesarean section   S:         Reports feeling well             Tolerating po intake / no nausea / no vomiting / no flatus / no BM             Bleeding is light             Pain controlled withduramorph and started motrin             Up ad lib / ambulatory  Newborn breast feeding  / female newborn   O:  A & O x 3 NAD             VS: Blood pressure 108/73, pulse 68, temperature 98 F (36.7 C), temperature source Oral, resp. rate 16, weight 75.751 kg (167 lb), last menstrual period 11/30/2010, SpO2 98.00%, unknown if currently breastfeeding.  LABS: WBC/Hgb/Hct/Plts:  12.3/10.0/29.3/138 (02/24 0505)   I&O:  + 2460  Lungs: Clear and unlabored  Heart: regular rate and rhythm / no mumurs  Abdomen: soft, non-tender, non-distended, active bowel sounds             Fundus: firm, non-tender, Ueven             Dressing intact             Lochia: light  Extremities: trace edema, no calf pain or tenderness  A:        POD # 1 S/P cesarean section            Mild acute blood loss anemia  P:        Routine postoperative care              Advance activity and remove dressing today     Marlinda Mike 09/01/2011, 10:39 AM

## 2011-09-01 NOTE — Addendum Note (Signed)
Addendum  created 09/01/11 1035 by Edison Pace, CRNA   Modules edited:Notes Section

## 2011-09-01 NOTE — Anesthesia Postprocedure Evaluation (Signed)
  Anesthesia Post-op Note  Patient: Kelsey [name]  Procedure(s) Performed: Procedure(s) (LRB): CESAREAN SECTION ([name])  Patient Location: Mother/Baby  Anesthesia Type: Spinal  Level of Consciousness: awake, alert  and oriented  Airway and Oxygen Therapy: Patient Spontanous Breathing  Post-op Pain: mild  Post-op Assessment: Patient's Cardiovascular Status Stable and Respiratory Function Stable  Post-op Vital Signs: Reviewed and stable  Complications: No apparent anesthesia complications

## 2011-09-02 ENCOUNTER — Encounter (HOSPITAL_COMMUNITY): Payer: Self-pay | Admitting: Obstetrics

## 2011-09-02 MED ORDER — OXYCODONE-ACETAMINOPHEN 5-325 MG PO TABS: 1.0000 | ORAL_TABLET | ORAL | Status: AC | PRN

## 2011-09-02 MED ORDER — IBUPROFEN 600 MG PO TABS: 600.0000 mg | ORAL_TABLET | Freq: Four times a day (QID) | ORAL | Status: AC | PRN

## 2011-09-02 NOTE — Progress Notes (Signed)
Patient ID: Kelsey [name], female   DOB: [birth year], 36 y.o.   MRN: [id number]  POSTOPERATIVE DAY # 2 S/P cesareans section   S:         Reports feeling well             Tolerating po intake / no  nausea / no  vomiting / + flatus / no  BM             Bleeding is light             Pain controlled with motrin and percocet             Up ad lib / ambulatory  Newborn breast  feeding   O:  A & O x 3 NAD             VS: Blood pressure 113/66, pulse 71, temperature 97.5 F (36.4 C), temperature source Oral, resp. rate 18, weight 75.751 kg (167 lb), last menstrual period 11/30/2010, SpO2 98.00%, unknown if currently breastfeeding.  Lungs: Clear and unlabored  Heart: regular rate and rhythm / no mumurs  Abdomen: soft, non-tender, non-distended , active bowel sounds             Fundus: firm, non-tender, U-1             Dressing OFF              Incision:  approximated with staples / no erythema / no ecchymosis /  nodrainage  Perineum: no edema  Lochia: light  Extremities: no edema, no calf pain or tenderness  A:        POD # 2 S/P cesarean section            Stable for early discharge today  P:        Routine postoperative care              D/c home             Staple removal at [facility] Wednesday     Marlinda Mike 09/02/2011, 12:35 PM

## 2011-09-02 NOTE — Plan of Care (Signed)
Problem: Discharge Progression Outcomes Goal: Remove staples per MD order Outcome: Not Applicable Date Met:  09/02/11 Pt to return to MD office on Wed. Feb 27 for staple removal

## 2011-09-02 NOTE — Discharge Summary (Signed)
POSTOPERATIVE DISCHARGE SUMMARY:  Patient ID: Khelani Kops MRN: [id number] DOB/AGE: [birth year] 36 y.o.  Admit date: 08/31/2011 Discharge date:  09/02/2011  Admission Diagnoses:  39 weeks previous cesarean section - elective repeat  Discharge Diagnoses:   Term Pregnancy-delivered POD 2 cesarean section  Prenatal history: [id number]   EDC : 09/07/2011, Alternate EDD Entry  Prenatal care at [facility] Primary provider : Fogleman  Prenatal Labs: ABO, Rh: O (08/12 1427) positive Antibody: POS (02/23 1009) Rubella: Immune (08/12 1427)  RPR: NON REACTIVE (02/22 1003)  HBsAg: Negative (08/12 1427)  HIV: Non-reactive (08/12 1427)  GBS:    1 hr Glucola : nl   Medical / Surgical History :  Past medical history:  Past Medical History  Diagnosis Date  . Abnormal Pap smear   . Anemia   . Chronic sinusitis   . Pyelonephritis 2008    Pylo and stones in the past  . Sinus problem     sinusitis  . Headache     Past surgical history:  Past Surgical History  Procedure Date  . Cesarean section   . Knee arthroscopy     right knee  . Colposcopy   . Wisdom tooth extraction     Family History:  Family History  Problem Relation Age of Onset  . Heart disease Brother     sudden cardiac death  . Asthma Brother   . Heart disease Maternal Grandfather 53    MI  . Coronary artery disease Maternal Grandfather   . Diabetes Maternal Grandfather   . Kidney disease Maternal Grandfather   . Cancer Neg Hx   . Stroke Neg Hx   . Hypertension Neg Hx     Social History:  reports that she has never smoked. She has never used smokeless tobacco. She reports that she does not drink alcohol or use illicit drugs.   Allergies: Morphine and related and Sulfa antibiotics    Current Medications at time of admission:  Prenatal  Valtrex  Procedures: Cesarean section delivery of female newborn by Dr Ernestina Penna  See operative report for further details  Postoperative /  postpartum course: uneventful  Physical Exam:   VSS: Blood pressure 113/66, pulse 71, temperature 97.5 F (36.4 C), temperature source Oral, resp. rate 18, weight 75.751 kg (167 lb), last menstrual period 11/30/2010, SpO2 98.00%, unknown if currently breastfeeding. Normal physical exam LABS:   preop hgb 11.3 / postop hgb 10.0 Incision:  approximated with staples / no erythema / no ecchymosis / no drainage Staples: intact for removal at [facility] on Wednesday  Discharge Instructions:  Discharged Condition: stable Activity: pelvic rest and postoperative restriction Diet: routine Medications: see below Medication List  As of 09/02/2011 12:39 PM   TAKE these medications         CALCIUM 600/VITAMIN D 600-400 MG-UNIT per tablet   Generic drug: Calcium Carbonate-Vitamin D   Take 1 tablet by mouth daily.      fish oil-omega-3 fatty acids 1000 MG capsule   Take 1 g by mouth daily.      fluticasone 50 MCG/ACT nasal spray   Commonly known as: FLONASE   Place 1 spray into the nose daily.      FOLIC ACID PO   Take 400 mcg by mouth daily.      ibuprofen 600 MG tablet   Commonly known as: ADVIL,MOTRIN   Take 1 tablet (600 mg total) by mouth every 6 (six) hours as needed for pain.  IRON PO   Take 65 mg by mouth daily.      nitrofurantoin (macrocrystal-monohydrate) 100 MG capsule   Commonly known as: MACROBID   Take 100 mg by mouth daily.      OVER THE COUNTER MEDICATION   Take 1 tablet by mouth daily. Over the counter, POTASSIUM 550mg  supplement      oxyCODONE-acetaminophen 5-325 MG per tablet   Commonly known as: PERCOCET   Take 1-2 tablets by mouth every 3 (three) hours as needed (moderate - severe pain).      pantoprazole 40 MG tablet   Commonly known as: PROTONIX   Take 40 mg by mouth daily.      PRENATAL VITAMIN PO   Take 1 tablet by mouth daily.      valACYclovir 500 MG tablet   Commonly known as: VALTREX   Take 500 mg by mouth daily.      Vitamin C 500 MG Caps     Take 1 tablet by mouth daily.      Vitamin D 1000 UNITS capsule   Take 1,000 Units by mouth daily.           Condition: stable Postpartum Instructions: refer to practice specific booklet Discharge to: home Disposition: 01-Home or Self Care Follow up :  Follow-up Information    Follow up with [facility] MD. Schedule an appointment as soon as possible for a visit in 6 weeks.   Contact information:   [address] [place] [address] [phone]           Signed: Marlinda Mike 09/02/2011, 12:39 PM

## 2011-09-04 LAB — TYPE AND SCREEN
ABO/RH(D): O NEG
Antibody Screen: POSITIVE
DAT, IgG: NEGATIVE
Unit division: 0
Unit division: 0

## 2012-03-13 ENCOUNTER — Encounter: Payer: Self-pay | Admitting: Family Medicine

## 2012-03-13 ENCOUNTER — Ambulatory Visit (INDEPENDENT_AMBULATORY_CARE_PROVIDER_SITE_OTHER): Payer: 59 | Admitting: Family Medicine

## 2012-03-13 VITALS — BP 100/60 | HR 69 | Temp 98.3°F | Wt 150.0 lb

## 2012-03-13 DIAGNOSIS — J029 Acute pharyngitis, unspecified: Secondary | ICD-10-CM

## 2012-03-13 LAB — POCT RAPID STREP A (OFFICE): Rapid Strep A Screen: NEGATIVE

## 2012-03-13 NOTE — Patient Instructions (Signed)

## 2012-03-13 NOTE — Progress Notes (Signed)
  Subjective:     Kelsey [name] is a 36 y.o. female who presents for evaluation of sore throat. Associated symptoms include post nasal drip, sore throat and swollen glands. Onset of symptoms was 1 day ago, and have been unchanged since that time. She is drinking plenty of fluids. She has not had a recent close exposure to someone with proven streptococcal pharyngitis.  The following portions of the patient's history were reviewed and updated as appropriate: allergies, current medications, past family history, past medical history, past social history, past surgical history and problem list.  Review of Systems Pertinent items are noted in HPI.    Objective:    BP 100/60  Pulse 69  Temp 98.3 F (36.8 C) (Oral)  Wt 150 lb (68.04 kg)  SpO2 98%  Breastfeeding? Yes General appearance: alert, cooperative, appears stated age and no distress Ears: normal TM's and external ear canals both ears Nose: Nares normal. Septum midline. Mucosa normal. No drainage or sinus tenderness. Throat: abnormal findings: mild oropharyngeal erythema and no exudates Neck: mild anterior cervical adenopathy, supple, symmetrical, trachea midline and thyroid not enlarged, symmetric, no tenderness/mass/nodules Lungs: clear to auscultation bilaterally  Laboratory Strep test done. Results:negative.    Assessment:    Acute pharyngitis, likely  Viral pharyngitis.    Plan:    Use of OTC analgesics recommended as well as salt water gargles. Follow up as needed. culture sent to lab

## 2012-03-15 LAB — CULTURE, GROUP A STREP: Organism ID, Bacteria: NORMAL

## 2012-05-04 ENCOUNTER — Ambulatory Visit (INDEPENDENT_AMBULATORY_CARE_PROVIDER_SITE_OTHER): Payer: 59 | Admitting: Family Medicine

## 2012-05-04 ENCOUNTER — Encounter: Payer: Self-pay | Admitting: Family Medicine

## 2012-05-04 VITALS — BP 115/77 | HR 95 | Temp 98.6°F | Ht 63.0 in | Wt 149.8 lb

## 2012-05-04 DIAGNOSIS — L259 Unspecified contact dermatitis, unspecified cause: Secondary | ICD-10-CM

## 2012-05-04 MED ORDER — TRIAMCINOLONE ACETONIDE 0.1 % EX OINT: TOPICAL_OINTMENT | Freq: Two times a day (BID) | CUTANEOUS | Status: DC

## 2012-05-04 NOTE — Progress Notes (Signed)
  Subjective:    Patient ID: Kelsey [name], female    DOB: 1975-08-22, 36 y.o.   MRN: [id number]  HPI Rash- 1st noticed on L arm yesterday.  Also on R arm and L flank.  Nothing in groin.  Patch on back.  Nothing on hands or feet.  No recent changes in soaps, detergents, lotions.  No one in home w/ similar sxs.  Mom recently w/ shingles.  Pt w/ hx of very sensitive skin.  Very itchy.  No pain.   Review of Systems For ROS see HPI     Objective:   Physical Exam  Vitals reviewed. Constitutional: She appears well-developed and well-nourished. No distress.  Skin: Skin is warm and dry. Rash (fine maculopapular rash on L flank along waistband, scattered areas on flexor surfaces of forearms bilaterally.  no vesicles consistent w/ shingles) noted.          Assessment & Plan:

## 2012-05-04 NOTE — Assessment & Plan Note (Signed)
New.  Pt's sxs and appearance of rash consistent w/ contact dermatitis.  Start triamcinolone ointment bid prn.  Reviewed supportive care and red flags that should prompt return.  Pt expressed understanding and is in agreement w/ plan.

## 2012-05-04 NOTE — Patient Instructions (Addendum)
This appears to be contact dermatitis Start the Triamcinolone ointment twice daily for itching Call with any questions or concerns Happy Fall!!!

## 2012-05-13 ENCOUNTER — Ambulatory Visit (INDEPENDENT_AMBULATORY_CARE_PROVIDER_SITE_OTHER): Payer: 59 | Admitting: Family Medicine

## 2012-05-13 ENCOUNTER — Encounter: Payer: Self-pay | Admitting: Family Medicine

## 2012-05-13 VITALS — BP 106/68 | HR 79 | Temp 98.3°F | Resp 16 | Ht 62.5 in | Wt 150.4 lb

## 2012-05-13 DIAGNOSIS — Z0000 Encounter for general adult medical examination without abnormal findings: Secondary | ICD-10-CM | POA: Insufficient documentation

## 2012-05-13 DIAGNOSIS — Z23 Encounter for immunization: Secondary | ICD-10-CM

## 2012-05-13 MED ORDER — BETAMETHASONE VALERATE 0.12 % EX FOAM: 1.0000 "application " | Freq: Every day | CUTANEOUS | Status: DC

## 2012-05-13 NOTE — Assessment & Plan Note (Signed)
Pt's PE WNL.  Check labs.  UTD on GYN.  Anticipatory guidance provided.  

## 2012-05-13 NOTE — Patient Instructions (Addendum)
You look great!  Keep up the good work! We'll notify you of your lab results and make any changes if needed Call with any questions or concerns Happy Fall!!!

## 2012-05-13 NOTE — Progress Notes (Signed)
  Subjective:    Patient ID: Kelsey [name], female    DOB: 1976-02-05, 36 y.o.   MRN: [id number]  HPI CPE- no concerns today.  UTD on GYN.   Review of Systems Patient reports no vision/ hearing changes, adenopathy,fever, weight change,  persistant/recurrent hoarseness , swallowing issues, chest pain, palpitations, edema, persistant/recurrent cough, hemoptysis, dyspnea (rest/exertional/paroxysmal nocturnal), gastrointestinal bleeding (melena, rectal bleeding), abdominal pain, bowel changes, GU symptoms (dysuria, hematuria, incontinence), Gyn symptoms (abnormal  bleeding, pain),  syncope, focal weakness, memory loss, numbness & tingling, skin/hair/nail changes, abnormal bruising or bleeding, anxiety, or depression.   +GERD- hx of bulimia x10 yrs, family hx    Objective:   Physical Exam General Appearance:    Alert, cooperative, no distress, appears stated age  Head:    Normocephalic, without obvious abnormality, atraumatic  Eyes:    PERRL, conjunctiva/corneas clear, EOM's intact, fundi    benign, both eyes  Ears:    Normal TM's and external ear canals, both ears  Nose:   Nares normal, septum midline, mucosa normal, no drainage    or sinus tenderness  Throat:   Lips, mucosa, and tongue normal; teeth and gums normal  Neck:   Supple, symmetrical, trachea midline, no adenopathy;    Thyroid: no enlargement/tenderness/nodules  Back:     Symmetric, no curvature, ROM normal, no CVA tenderness  Lungs:     Clear to auscultation bilaterally, respirations unlabored  Chest Wall:    No tenderness or deformity   Heart:    Regular rate and rhythm, S1 and S2 normal, no murmur, rub   or gallop  Breast Exam:    Deferred to GYN  Abdomen:     Soft, non-tender, bowel sounds active all four quadrants,    no masses, no organomegaly  Genitalia:    Deferred to GYN  Rectal:    Extremities:   Extremities normal, atraumatic, no cyanosis or edema  Pulses:   2+ and symmetric all extremities  Skin:   Skin  color, texture, turgor normal, no rashes or lesions  Lymph nodes:   Cervical, supraclavicular, and axillary nodes normal  Neurologic:   CNII-XII intact, normal strength, sensation and reflexes    throughout          Assessment & Plan:

## 2012-05-14 ENCOUNTER — Other Ambulatory Visit: Payer: Self-pay | Admitting: General Practice

## 2012-05-14 LAB — CBC WITH DIFFERENTIAL/PLATELET
Basophils Absolute: 0 10*3/uL (ref 0.0–0.1)
Basophils Relative: 0.3 % (ref 0.0–3.0)
Eosinophils Absolute: 0.3 10*3/uL (ref 0.0–0.7)
Eosinophils Relative: 3.8 % (ref 0.0–5.0)
HCT: 39 % (ref 36.0–46.0)
Hemoglobin: 12.9 g/dL (ref 12.0–15.0)
Lymphocytes Relative: 25.2 % (ref 12.0–46.0)
Lymphs Abs: 1.8 10*3/uL (ref 0.7–4.0)
MCHC: 33.1 g/dL (ref 30.0–36.0)
MCV: 89.8 fl (ref 78.0–100.0)
Monocytes Absolute: 0.5 10*3/uL (ref 0.1–1.0)
Monocytes Relative: 7.5 % (ref 3.0–12.0)
Neutro Abs: 4.5 10*3/uL (ref 1.4–7.7)
Neutrophils Relative %: 63.2 % (ref 43.0–77.0)
Platelets: 242 10*3/uL (ref 150.0–400.0)
RBC: 4.35 Mil/uL (ref 3.87–5.11)
RDW: 13.5 % (ref 11.5–14.6)
WBC: 7.1 10*3/uL (ref 4.5–10.5)

## 2012-05-14 LAB — BASIC METABOLIC PANEL
BUN: 16 mg/dL (ref 6–23)
CO2: 27 mEq/L (ref 19–32)
Calcium: 9 mg/dL (ref 8.4–10.5)
Chloride: 101 mEq/L (ref 96–112)
Creatinine, Ser: 0.8 mg/dL (ref 0.4–1.2)
GFR: 93.4 mL/min (ref >60.00)
Glucose, Bld: 85 mg/dL (ref 70–99)
Potassium: 3.7 mEq/L (ref 3.5–5.1)
Sodium: 137 mEq/L (ref 135–145)

## 2012-05-14 LAB — LIPID PANEL
Cholesterol: 189 mg/dL (ref 0–200)
HDL: 68.6 mg/dL (ref >39.00)
LDL Cholesterol: 95 mg/dL (ref 0–99)
Total CHOL/HDL Ratio: 3
Triglycerides: 129 mg/dL (ref 0.0–149.0)
VLDL: 25.8 mg/dL (ref 0.0–40.0)

## 2012-05-14 LAB — HEPATIC FUNCTION PANEL
ALT: 18 U/L (ref 0–35)
AST: 23 U/L (ref 0–37)
Albumin: 4.2 g/dL (ref 3.5–5.2)
Alkaline Phosphatase: 61 U/L (ref 39–117)
Bilirubin, Direct: 0 mg/dL (ref 0.0–0.3)
Total Bilirubin: 0.4 mg/dL (ref 0.3–1.2)
Total Protein: 7.1 g/dL (ref 6.0–8.3)

## 2012-05-14 LAB — TSH: TSH: 0.8 u[IU]/mL (ref 0.35–5.50)

## 2012-05-14 MED ORDER — UREA 40 % EX CREA: TOPICAL_CREAM | CUTANEOUS | Status: DC

## 2012-05-19 LAB — VITAMIN D 1,25 DIHYDROXY
Vitamin D 1, 25 (OH)2 Total: 62 pg/mL (ref 18–72)
Vitamin D2 1, 25 (OH)2: <8 pg/mL
Vitamin D3 1, 25 (OH)2: 62 pg/mL

## 2012-11-04 ENCOUNTER — Ambulatory Visit (INDEPENDENT_AMBULATORY_CARE_PROVIDER_SITE_OTHER): Payer: 59 | Admitting: Family Medicine

## 2012-11-04 ENCOUNTER — Encounter: Payer: Self-pay | Admitting: Family Medicine

## 2012-11-04 VITALS — BP 112/66 | HR 89 | Temp 98.2°F | Wt 136.6 lb

## 2012-11-04 DIAGNOSIS — R109 Unspecified abdominal pain: Secondary | ICD-10-CM

## 2012-11-04 LAB — POCT URINALYSIS DIPSTICK
Bilirubin, UA: NEGATIVE
Glucose, UA: NEGATIVE
Ketones, UA: NEGATIVE
Leukocytes, UA: NEGATIVE
Nitrite, UA: NEGATIVE
Spec Grav, UA: <=1.005
Urobilinogen, UA: 0.2
pH, UA: 7

## 2012-11-04 LAB — HCG, QUANTITATIVE, PREGNANCY: hCG, Beta Chain, Quant, S: 0.35 m[IU]/mL

## 2012-11-04 LAB — POCT URINE PREGNANCY: Preg Test, Ur: NEGATIVE

## 2012-11-04 NOTE — Patient Instructions (Signed)

## 2012-11-04 NOTE — Progress Notes (Signed)
  Subjective:    Patient ID: Kelsey [name], female    DOB: Jul 14, 1975, 37 y.o.   MRN: [id number]  HPI Pt here c/o abd cramping and thought she had a virus but then she took a pregnancy test in lobby and it was +  Pt has a paraguard IUD.    Review of Systems As above    Objective:   Physical Exam BP 112/66  Pulse 89  Temp(Src) 98.2 F (36.8 C) (Oral)  Wt 136 lb 9.6 oz (61.961 kg)  BMI 24.57 kg/m2  SpO2 97%  LMP 10/13/2012 General appearance: alert, cooperative, appears stated age and no distress     LMP  10/12/2012    edc 07/19/13---- however pt states this makes no sense because her husband was not in town the predicted date of conception --- we did preg test in office which was neg Will check quant BHCG and pt will go to gyn Assessment & Plan:  abd cramping----  ? Pregnancy with IUD                          Check BHCG stat                         Pt to see gyn this afternoon

## 2013-01-05 LAB — HM PAP SMEAR: HM Pap smear: NORMAL

## 2013-02-03 ENCOUNTER — Encounter: Payer: Self-pay | Admitting: Family Medicine

## 2013-02-03 ENCOUNTER — Ambulatory Visit (INDEPENDENT_AMBULATORY_CARE_PROVIDER_SITE_OTHER): Payer: 59 | Admitting: Family Medicine

## 2013-02-03 VITALS — BP 108/80 | HR 87 | Temp 98.6°F | Wt 133.0 lb

## 2013-02-03 DIAGNOSIS — J020 Streptococcal pharyngitis: Secondary | ICD-10-CM | POA: Insufficient documentation

## 2013-02-03 LAB — POCT RAPID STREP A (OFFICE): Rapid Strep A Screen: POSITIVE — AB

## 2013-02-03 MED ORDER — FLUCONAZOLE 150 MG PO TABS: 150.0000 mg | ORAL_TABLET | Freq: Once | ORAL | Status: DC

## 2013-02-03 MED ORDER — AMOXICILLIN 875 MG PO TABS: 875.0000 mg | ORAL_TABLET | Freq: Two times a day (BID) | ORAL | Status: DC

## 2013-02-03 NOTE — Patient Instructions (Addendum)
This appears to be strep Start the Amox twice daily- take w/ food Drink plenty of fluids Alternate tylenol and ibuprofen every 4 hrs for pain/fever REST! Hang in there!

## 2013-02-03 NOTE — Progress Notes (Signed)
  Subjective:    Patient ID: Kelsey [name], female    DOB: 05-05-1976, 37 y.o.   MRN: [id number]  HPI Pt had fever >102 yesterday.  Bilateral ear pain.  + sore throat- 'more intense than usual'.  + nausea, no vomiting.  No cough.  + HA.  Minimal facial pressure.  + sick contacts.   Review of Systems For ROS see HPI     Objective:   Physical Exam  Vitals reviewed. Constitutional: She appears well-developed and well-nourished. No distress.  HENT:  Head: Normocephalic and atraumatic.  Nose: Nose normal.  TMs normal bilaterally No TTP over sinuses Bilateral tonsillar enlargement w/ erythema and exudate  Neck: Normal range of motion. Neck supple.  Cardiovascular: Normal rate, regular rhythm and normal heart sounds.   Pulmonary/Chest: Effort normal and breath sounds normal. No respiratory distress. She has no wheezes. She has no rales.  Lymphadenopathy:    She has cervical adenopathy.          Assessment & Plan:

## 2013-02-03 NOTE — Assessment & Plan Note (Signed)
New.  Rapid test +.  Start abx.  Reviewed supportive care and red flags that should prompt return.  Pt expressed understanding and is in agreement w/ plan.

## 2013-02-05 ENCOUNTER — Ambulatory Visit (INDEPENDENT_AMBULATORY_CARE_PROVIDER_SITE_OTHER): Payer: 59 | Admitting: Family Medicine

## 2013-02-05 ENCOUNTER — Ambulatory Visit (HOSPITAL_BASED_OUTPATIENT_CLINIC_OR_DEPARTMENT_OTHER)
Admission: RE | Admit: 2013-02-05 | Discharge: 2013-02-05 | Disposition: A | Discharge status: Home / Self Care | Payer: 59 | Source: Ambulatory Visit | Attending: Family Medicine | Admitting: Family Medicine

## 2013-02-05 ENCOUNTER — Telehealth: Payer: Self-pay | Admitting: Family Medicine

## 2013-02-05 ENCOUNTER — Encounter: Payer: Self-pay | Admitting: Family Medicine

## 2013-02-05 VITALS — BP 151/75 | HR 108 | Temp 100.7°F | Resp 18 | Wt 133.0 lb

## 2013-02-05 DIAGNOSIS — J020 Streptococcal pharyngitis: Secondary | ICD-10-CM

## 2013-02-05 DIAGNOSIS — J209 Acute bronchitis, unspecified: Secondary | ICD-10-CM

## 2013-02-05 DIAGNOSIS — R05 Cough: Secondary | ICD-10-CM | POA: Insufficient documentation

## 2013-02-05 DIAGNOSIS — J45901 Unspecified asthma with (acute) exacerbation: Secondary | ICD-10-CM

## 2013-02-05 DIAGNOSIS — R509 Fever, unspecified: Secondary | ICD-10-CM

## 2013-02-05 DIAGNOSIS — R059 Cough, unspecified: Secondary | ICD-10-CM | POA: Insufficient documentation

## 2013-02-05 DIAGNOSIS — J45909 Unspecified asthma, uncomplicated: Secondary | ICD-10-CM | POA: Insufficient documentation

## 2013-02-05 MED ORDER — ALBUTEROL SULFATE HFA 108 (90 BASE) MCG/ACT IN AERS: 2.0000 | INHALATION_SPRAY | Freq: Four times a day (QID) | RESPIRATORY_TRACT | Status: DC | PRN

## 2013-02-05 NOTE — Progress Notes (Signed)
OFFICE NOTE  02/05/2013  CC:  Chief Complaint  Patient presents with  . Fever  . Sore Throat  . Cough     HPI: Patient is a 37 y.o. Caucasian female who is here for ongoing ST and fever. Dx'd 2 days ago with strep + pharyngitis and started amoxil at that time.  She 's had ST about 5-6 days, started getting fevers 4d/a --up to 102, nausea without vomiting.  Fevers have persisted since abx that were started for strep throat. Cough productive of green sputum started yesterday morning, wheezing earlier today, some diffuse achiness in trunk region posterior and laterally.  No new nasal sx's, no facial pain.  Nausea persists but no vomiting and no abd pain.  She has been drinking fluids fine.  Her throat feels irritated diffusely--no asymmetry. Has no rash. Her two children have had viral infections in the last 1-2 wks.  Pertinent PMH:  Past Medical History  Diagnosis Date  . Abnormal Pap smear   . Anemia   . Chronic sinusitis   . Pyelonephritis 2008    Pylo and stones in the past  . Sinus problem     sinusitis  . Headache(784.0)   Asthma as a child: last used a rescue inhaler about 2010.  Past Surgical History  Procedure Laterality Date  . Cesarean section    . Knee arthroscopy      right knee  . Colposcopy    . Wisdom tooth extraction    . Cesarean section  08/31/2011    Procedure: CESAREAN SECTION;  Surgeon: Tresa Endo A. Ernestina Penna, MD;  Location: [facility];  Service: Gynecology;  Laterality: [name];  Repeat cesarean section with delivery of baby     MEDS:  Outpatient Prescriptions Prior to Visit  Medication Sig Dispense Refill  . amoxicillin (AMOXIL) 875 MG tablet Take 1 tablet (875 mg total) by mouth 2 (two) times daily.  20 tablet  0  . valACYclovir (VALTREX) 500 MG tablet Take 500 mg by mouth daily.      . Ascorbic Acid (VITAMIN C) 500 MG CAPS Take 1 tablet by mouth daily.       . Betamethasone Valerate 0.12 % foam Apply 1 application topically daily.  100 g  6  . Calcium  Carbonate-Vitamin D (CALCIUM 600/VITAMIN D) 600-400 MG-UNIT per tablet Take 1 tablet by mouth daily.      . fish oil-omega-3 fatty acids 1000 MG capsule Take 1 g by mouth daily.       . fluconazole (DIFLUCAN) 150 MG tablet Take 1 tablet (150 mg total) by mouth once.  1 tablet  0  . fluticasone (FLONASE) 50 MCG/ACT nasal spray Place 1 spray into the nose daily.       Marland Kitchen FOLIC ACID PO Take 400 mcg by mouth daily.       . IRON PO Take 65 mg by mouth daily.       Marland Kitchen OVER THE COUNTER MEDICATION Take 1 tablet by mouth daily. Over the counter, POTASSIUM 550mg  supplement      . pantoprazole (PROTONIX) 40 MG tablet Take 40 mg by mouth daily.        . Prenatal Vit-Fe Fumarate-FA (PRENAPLUS) 27-1 MG TABS       . Prenatal Vit-Fe Sulfate-FA (PRENATAL VITAMIN PO) Take 1 tablet by mouth daily.       . tranexamic acid (LYSTEDA) 650 MG TABS Take 1,300 mg by mouth 3 (three) times daily.      Marland Kitchen triamcinolone ointment (KENALOG) 0.1 %  Apply topically 2 (two) times daily.  90 g  1  . urea (CARMOL) 40 % CREA Apply topically bid.  198.6 each  3   No facility-administered medications prior to visit.    PE: Blood pressure 151/75, pulse 108, temperature 100.7 F (38.2 C), temperature source Temporal, resp. rate 18, weight 133 lb (60.328 kg), last menstrual period 01/29/2013, SpO2 98.00%, not currently breastfeeding. Gen: alert, tired and cold-appearing but is in NAD.  Oriented x 4, pleasant. ENT: Ears: EACs clear, normal epithelium.  TMs with good light reflex and landmarks bilaterally.  Eyes: no injection, icteris, swelling, or exudate.  EOMI, PERRLA. Nose: no drainage or turbinate edema/swelling.  No injection or focal lesion.  Mouth: lips without lesion/swelling.  Oral mucosa pink and moist.  Dentition intact and without obvious caries or gingival swelling.  Oropharynx without erythema, exudate, or swelling.  Neck - No masses or thyromegaly or limitation in range of motion.  No significant areas of tenderness or  LAD. CV: regular, mildly tachycardic, without m/r/g LUNGS: CTA bilat, nonlabored resps.  Expiratory phase is prolonged and she has frequent post-exhalation coughing.  Aeration is good. EXT: no clubbing, cyanosis, or edema.  Skin - no sores or suspicious lesions or rashes or color changes  LAB: none  IMPRESSION AND PLAN:  Acute asthmatic bronchitis Fevers+diffuse body aches with this = FLU LIKE ILLNESS.  I rx'd ventolin HFA today, 1-2 puffs q4h prn cough/wheeze.  I recommended mucinex DM OTC. Although lungs are clear, with ongoing fevers and productive cough I will check a CXR now and if infiltrate present will add azithromycin to her current amoxil.   Streptococcal sore throat Her throat looks fine; no sign of peritonsillar or retropharyngeal abscess. Continue amoxil.   An After Visit Summary was printed and given to the patient.  FOLLOW UP: prn  ADDENDUM 02/05/13: CXR was normal.  Notified patient that no additional abx needed (left voice mail message on home phone and her cell phone).--PM

## 2013-02-05 NOTE — Telephone Encounter (Signed)
Patient Information:  Caller Name: Keala  Phone: [phone]  Patient: Kelsey [name], Kelsey [name]  Gender: Female  DOB: [birth year]  Age: 37 Years  PCP: Sheliah Hatch  Pregnant: No  Office Follow Up:  Does the office need to follow up with this patient?: No  Instructions For The Office: [name]  RN Note:  Cough with wheezing and chest tightness.  History of asthma; has not used Albuterol in > 2 years. No appointments remain for 02/05/13 at [facility] office.  Scheduled for 1545 02/05/13 with Dr. Milinda Cave at [facility] office.  Symptoms  Reason For Call & Symptoms: Reports feeling worse with new onset of cough and wheezing, fever and chills.  Diagnosed with strep throat 02/03/13 and taking Amoxicillin  Reviewed Health History In EMR: Yes  Reviewed Medications In EMR: Yes  Reviewed Allergies In EMR: Yes  Reviewed Surgeries / Procedures: Yes  Date of Onset of Symptoms: 02/04/2013  Treatments Tried: Advil at  0600 and 1300, warm fluids  Treatments Tried Worked: Yes  Any Fever: Yes  Fever Taken: Ear Thermometer  Fever Time Of Reading: 14:45:00  Fever Last Reading: 101.4 OB / GYN:  LMP: 01/29/2013  Guideline(s) Used:  Cough  Disposition Per Guideline:   Go to Office Now  Reason For Disposition Reached:   Wheezing is present  Advice Given:  Reassurance  Coughing is the way that our lungs remove irritants and mucus. It helps protect our lungs from getting pneumonia.  You can get a dry hacking cough after a chest cold. Sometimes this type of cough can last 1-3 weeks, and be worse at night.  Cough Medicines:  Home Remedy - Hard Candy: Hard candy works just as well as medicine-flavored OTC cough drops. Diabetics should use sugar-free candy.  Home Remedy - Honey: This old home remedy has been shown to help decrease coughing at night. The adult dosage is 2 teaspoons (10 ml) at bedtime. Honey should not be given to infants under one year of age.  Coughing Spasms:  Drink warm fluids. Inhale warm  mist (Reason: both relax the airway and loosen up the phlegm).  Suck on cough drops or hard candy to coat the irritated throat.  Call Back If:  Difficulty breathing  Cough lasts more than 3 weeks  Fever lasts > 3 days  You become worse.  Patient Will Follow Care Advice:  YES

## 2013-02-05 NOTE — Assessment & Plan Note (Signed)
Her throat looks fine; no sign of peritonsillar or retropharyngeal abscess. Continue amoxil.

## 2013-02-05 NOTE — Assessment & Plan Note (Signed)
Fevers+diffuse body aches with this = FLU LIKE ILLNESS.  I rx'd ventolin HFA today, 1-2 puffs q4h prn cough/wheeze.  I recommended mucinex DM OTC. Although lungs are clear, with ongoing fevers and productive cough I will check a CXR now and if infiltrate present will add azithromycin to her current amoxil.

## 2013-02-08 NOTE — Telephone Encounter (Signed)
Pt was seen at the [facility] office on Friday 02-05-13.//AB/CMA

## 2013-02-10 ENCOUNTER — Telehealth: Payer: Self-pay | Admitting: Family Medicine

## 2013-02-10 MED ORDER — HYDROCODONE-HOMATROPINE 5-1.5 MG/5ML PO SYRP: ORAL_SOLUTION | ORAL | Status: DC

## 2013-02-10 NOTE — Telephone Encounter (Signed)
Patient states that she still has a sore throat and a nagging cough that she can't seem to get rid of.   She was wondering if she could get the cough medication that you all talked about at her visit.  Patient states that her mom is now staying with her to help out with her child.   Please advise.

## 2013-02-10 NOTE — Telephone Encounter (Signed)
Faxed to [facility].

## 2013-02-10 NOTE — Telephone Encounter (Signed)
Rx for hycodan printed 

## 2013-04-30 ENCOUNTER — Ambulatory Visit (INDEPENDENT_AMBULATORY_CARE_PROVIDER_SITE_OTHER): Payer: 59 | Admitting: Nurse Practitioner

## 2013-04-30 VITALS — BP 123/70 | HR 87 | Temp 98.8°F | Wt 135.4 lb

## 2013-04-30 DIAGNOSIS — R300 Dysuria: Secondary | ICD-10-CM

## 2013-04-30 MED ORDER — NITROFURANTOIN MONOHYD MACRO 100 MG PO CAPS: 100.0000 mg | ORAL_CAPSULE | Freq: Two times a day (BID) | ORAL | Status: DC

## 2013-04-30 MED ORDER — PHENAZOPYRIDINE HCL 200 MG PO TABS: 200.0000 mg | ORAL_TABLET | Freq: Three times a day (TID) | ORAL | Status: DC | PRN

## 2013-04-30 NOTE — Patient Instructions (Signed)
Please sip fluids every hour to flush bladder. Start antibiotic. You may take 500 to 1000mg  Vit C twice daily to acidify urine. This discourages bacterial growth. Feel better.  Urinary Tract Infection Urinary tract infections (UTIs) can develop anywhere along your urinary tract. Your urinary tract is your body's drainage system for removing wastes and extra water. Your urinary tract includes two kidneys, two ureters, a bladder, and a urethra. Your kidneys are a pair of bean-shaped organs. Each kidney is about the size of your fist. They are located below your ribs, one on each side of your spine. CAUSES Infections are caused by microbes, which are microscopic organisms, including fungi, viruses, and bacteria. These organisms are so small that they can only be seen through a microscope. Bacteria are the microbes that most commonly cause UTIs. SYMPTOMS  Symptoms of UTIs may vary by age and gender of the patient and by the location of the infection. Symptoms in young women typically include a frequent and intense urge to urinate and a painful, burning feeling in the bladder or urethra during urination. Older women and men are more likely to be tired, shaky, and weak and have muscle aches and abdominal pain. A fever may mean the infection is in your kidneys. Other symptoms of a kidney infection include pain in your back or sides below the ribs, nausea, and vomiting. DIAGNOSIS To diagnose a UTI, your caregiver will ask you about your symptoms. Your caregiver also will ask to provide a urine sample. The urine sample will be tested for bacteria and white blood cells. White blood cells are made by your body to help fight infection. TREATMENT  Typically, UTIs can be treated with medication. Because most UTIs are caused by a bacterial infection, they usually can be treated with the use of antibiotics. The choice of antibiotic and length of treatment depend on your symptoms and the type of bacteria causing your  infection. HOME CARE INSTRUCTIONS  If you were prescribed antibiotics, take them exactly as your caregiver instructs you. Finish the medication even if you feel better after you have only taken some of the medication.  Drink enough water and fluids to keep your urine clear or pale yellow.  Avoid caffeine, tea, and carbonated beverages. They tend to irritate your bladder.  Empty your bladder often. Avoid holding urine for long periods of time.  Empty your bladder before and after sexual intercourse.  After a bowel movement, women should cleanse from front to back. Use each tissue only once. SEEK MEDICAL CARE IF:   You have back pain.  You develop a fever.  Your symptoms do not begin to resolve within 3 days. SEEK IMMEDIATE MEDICAL CARE IF:   You have severe back pain or lower abdominal pain.  You develop chills.  You have nausea or vomiting.  You have continued burning or discomfort with urination. MAKE SURE YOU:   Understand these instructions.  Will watch your condition.  Will get help right away if you are not doing well or get worse. Document Released: 04/03/2005 Document Revised: 12/24/2011 Document Reviewed: 08/02/2011 [facility] Patient Information 2014 [place], [place].

## 2013-04-30 NOTE — Progress Notes (Signed)
  Subjective:    Patient ID: Kelsey [name], female    DOB: [birth year], 37 y.o.   MRN: [id number]  Urinary Tract Infection  This is a new problem. The current episode started today. The problem occurs every urination. The problem has been unchanged. The quality of the pain is described as burning. The pain is mild. She is sexually active. There is a history of pyelonephritis. Associated symptoms include frequency and urgency. Pertinent negatives include no chills, discharge, flank pain, hematuria, hesitancy, nausea or vomiting. She has tried nothing for the symptoms. There is no history of recurrent UTIs (Hx of recurrent UTI before last child-18 mos ago.).      Review of Systems  Constitutional: Negative for fever, chills and fatigue.  Gastrointestinal: Negative for nausea, vomiting and abdominal pain.  Genitourinary: Positive for urgency and frequency. Negative for hesitancy, hematuria and flank pain.  Musculoskeletal: Negative for arthralgias and back pain.  Skin: Negative for rash.       Objective:   Physical Exam  Vitals reviewed. Constitutional: She is oriented to person, place, and time. She appears well-developed and well-nourished. No distress.  HENT:  Head: Normocephalic and atraumatic.  Eyes: Conjunctivae are normal. Right eye exhibits no discharge. Left eye exhibits no discharge.  Cardiovascular: Normal rate.   Pulmonary/Chest: Effort normal.  Abdominal: Soft. She exhibits no distension and no mass. There is tenderness (epigastric). There is no rebound and no guarding.  Musculoskeletal: She exhibits no tenderness (no CVA tenderness).  Normal gait  Neurological: She is alert and oriented to person, place, and time.  Skin: Skin is warm and dry.  Psychiatric: She has a normal mood and affect. Her behavior is normal. Judgment and thought content normal.          Assessment & Plan:  1. Dysuria Symptoms started today. Hx freq UTI before last child, 18 mos ago.  Pyelo x 1. -POC ua shows trace blood - Urine Microscopic - Urine culture - nitrofurantoin, macrocrystal-monohydrate, (MACROBID) 100 MG capsule; Take 1 capsule (100 mg total) by mouth 2 (two) times daily.  Dispense: 10 capsule; Refill: 0 - phenazopyridine (PYRIDIUM) 200 MG tablet; Take 1 tablet (200 mg total) by mouth 3 (three) times daily as needed for pain.  Dispense: 6 tablet; Refill: 0  See pt instructions

## 2013-04-30 NOTE — Addendum Note (Signed)
Addended by: Verdie Shire on: 04/30/2013 05:47 PM   Modules accepted: Orders

## 2013-05-01 LAB — URINALYSIS, MICROSCOPIC ONLY
Bacteria, UA: NONE SEEN
Casts: NONE SEEN
Crystals: NONE SEEN
Squamous Epithelial / LPF: NONE SEEN

## 2013-05-03 LAB — URINE CULTURE: Colony Count: >=100000

## 2013-06-21 ENCOUNTER — Ambulatory Visit (INDEPENDENT_AMBULATORY_CARE_PROVIDER_SITE_OTHER): Payer: 59 | Admitting: Family Medicine

## 2013-06-21 ENCOUNTER — Encounter: Payer: Self-pay | Admitting: Family Medicine

## 2013-06-21 VITALS — BP 122/80 | HR 107 | Temp 98.5°F | Resp 16 | Wt 129.5 lb

## 2013-06-21 DIAGNOSIS — G43909 Migraine, unspecified, not intractable, without status migrainosus: Secondary | ICD-10-CM

## 2013-06-21 DIAGNOSIS — F3181 Bipolar II disorder: Secondary | ICD-10-CM

## 2013-06-21 DIAGNOSIS — F502 Bulimia nervosa: Secondary | ICD-10-CM

## 2013-06-21 DIAGNOSIS — F3189 Other bipolar disorder: Secondary | ICD-10-CM

## 2013-06-21 MED ORDER — TOPIRAMATE 50 MG PO TABS: 50.0000 mg | ORAL_TABLET | Freq: Two times a day (BID) | ORAL | Status: DC

## 2013-06-21 NOTE — Assessment & Plan Note (Signed)
New.  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  Pt has appt scheduled to see Psych 1 month from now but doesn't feel she can wait that long to start meds.  Discussed SSRIs but my fear is that this would push her into mania.  Discussed Lamictal- pt fears weight gain.  Reviewed article indicating that topamax was successful in controlling the binge eating in bulimia, the cycles of bipolar and preventing migraines.  Will start pt on this and titrate slowly.  Will monitor closely for improvement.  Pt aware that psych may change entire tx plan.

## 2013-06-21 NOTE — Assessment & Plan Note (Signed)
New to provider.  Was unaware that pt had this dx until she disclosed today.  Has been seeing a counselor for this but lately feels 'out of control'.  Will start topamax in hopes of controlling this situation.

## 2013-06-21 NOTE — Progress Notes (Signed)
Pre visit review using our clinic review tool, if applicable. No additional management support is needed unless otherwise documented below in the visit note. 

## 2013-06-21 NOTE — Progress Notes (Signed)
   Subjective:    Patient ID: Kelsey [name], female    DOB: 04/06/1976, 37 y.o.   MRN: [id number]  HPI Depression- 'i can't seem to keep everything under control'.  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  Pt w/ a lot of family stress.  Last week while children were away, went to have a drink and 'ended up sleeping w/ someone else'.  Counselor told pt that she may have Bipolar.  Husband is aware.  Pt aware of lows but denies mania.  'possibly hypomania'.  Pt admits to spending money 'that i shouldn't spend'.  bulimia is 'really bad'.  Has appt upcoming w/ [facility] on Jan 16.  No self harm thoughts.  + fatigue.  Pt doesn't think she can wait a month before starting meds.   Review of Systems For ROS see HPI     Objective:   Physical Exam  Vitals reviewed. Constitutional: She is oriented to person, place, and time. She appears well-developed and well-nourished. She appears distressed (tearful, anxious).  Neurological: She is alert and oriented to person, place, and time.  Psychiatric: Her behavior is normal. Thought content normal.  Tearful, anxious          Assessment & Plan:

## 2013-06-21 NOTE — Assessment & Plan Note (Signed)
Hx of similar.  Has actually been on topamax previously as a prophylaxis.  Will restart and monitor.

## 2013-06-21 NOTE — Patient Instructions (Signed)
Follow up in 2 weeks to recheck mood Start the Topamax- 1/2 tab twice daily x1 week and then increase to 1 tab twice daily Please call if you have any thoughts of harming yourself or you develop any other symptoms Continue your counseling Call with any questions or concerns Hang in there!!!

## 2013-07-05 ENCOUNTER — Ambulatory Visit (INDEPENDENT_AMBULATORY_CARE_PROVIDER_SITE_OTHER): Payer: 59 | Admitting: Family Medicine

## 2013-07-05 ENCOUNTER — Encounter: Payer: Self-pay | Admitting: Family Medicine

## 2013-07-05 VITALS — BP 124/80 | HR 111 | Temp 98.5°F | Resp 16 | Wt 124.5 lb

## 2013-07-05 DIAGNOSIS — F3189 Other bipolar disorder: Secondary | ICD-10-CM

## 2013-07-05 DIAGNOSIS — F3181 Bipolar II disorder: Secondary | ICD-10-CM

## 2013-07-05 NOTE — Progress Notes (Signed)
Pre visit review using our clinic review tool, if applicable. No additional management support is needed unless otherwise documented below in the visit note. 

## 2013-07-05 NOTE — Progress Notes (Signed)
   Subjective:    Patient ID: Lajoyce Corners, female    DOB: 06-27-1976, 37 y.o.   MRN: [id number]  HPI Bipolar- has had only 2 bulemic episodes since starting Topamax, 'it has helped tremendously'.  Feels the compulsions have improved.  Is having excessive fatigue and isn't sure how much is her depressive state or all that she's going through.  Is doing hard work with her counselor.  Is considering inpt tx at [facility] outside [place] for the eating disorder.  Pt does note side effects from med- tingling of hands, word finding difficulties, staring into space temporarily/losing focus.  Able to care for girls.   Review of Systems For ROS see HPI     Objective:   Physical Exam  Vitals reviewed. Constitutional: She is oriented to person, place, and time. She appears well-developed and well-nourished. No distress.  Neurological: She is alert and oriented to person, place, and time.  Psychiatric: She has a normal mood and affect. Her behavior is normal. Judgment and thought content normal.          Assessment & Plan:

## 2013-07-05 NOTE — Assessment & Plan Note (Signed)
sxs much improved from previous.  Continues to face hard road ahead but is doing better w/ help from therapist.  Has appt scheduled w/ Psych on 1/16.  Will not increase topamax dose at this time due to side effects.  Pt able to contract for safety.  Will continue to follow along.

## 2013-07-05 NOTE — Patient Instructions (Signed)
Follow up as needed Follow up w/ Dr Haywood Lasso as scheduled Continue the Topamax twice daily Call with any questions or concerns Hang in there!!!

## 2013-07-23 ENCOUNTER — Ambulatory Visit (INDEPENDENT_AMBULATORY_CARE_PROVIDER_SITE_OTHER): Payer: 59 | Admitting: Family Medicine

## 2013-07-23 ENCOUNTER — Encounter: Payer: Self-pay | Admitting: Family Medicine

## 2013-07-23 VITALS — BP 110/78 | HR 91 | Temp 97.7°F | Resp 16 | Wt 124.5 lb

## 2013-07-23 DIAGNOSIS — J020 Streptococcal pharyngitis: Secondary | ICD-10-CM

## 2013-07-23 DIAGNOSIS — J329 Chronic sinusitis, unspecified: Secondary | ICD-10-CM

## 2013-07-23 MED ORDER — FLUCONAZOLE 150 MG PO TABS: 150.0000 mg | ORAL_TABLET | Freq: Once | ORAL | Status: DC

## 2013-07-23 MED ORDER — AMOXICILLIN 875 MG PO TABS: 875.0000 mg | ORAL_TABLET | Freq: Two times a day (BID) | ORAL | Status: DC

## 2013-07-23 NOTE — Progress Notes (Signed)
   Subjective:    Patient ID: Kelsey [name], female    DOB: [birth year], 38 y.o.   MRN: [id number]  HPI Sinusitis- was previously seeing ENT for recurrent infxns.  sxs started Tuesday w/ facial pain/pressure.  + ear fullness.  Mild cough- dry.  No fevers.  + nausea, diarrhea.  No vomiting.  No known sick contacts.   Review of Systems For ROS see HPI     Objective:   Physical Exam  Vitals reviewed. Constitutional: She appears well-developed and well-nourished. No distress.  HENT:  Head: Normocephalic and atraumatic.  Right Ear: Tympanic membrane normal.  Left Ear: Tympanic membrane normal.  Nose: Mucosal edema and rhinorrhea present. Right sinus exhibits maxillary sinus tenderness and frontal sinus tenderness. Left sinus exhibits maxillary sinus tenderness and frontal sinus tenderness.  Mouth/Throat: Uvula is midline and mucous membranes are normal. Posterior oropharyngeal erythema present. No oropharyngeal exudate.  Eyes: Conjunctivae and EOM are normal. Pupils are equal, round, and reactive to light.  Neck: Normal range of motion. Neck supple.  Cardiovascular: Normal rate, regular rhythm and normal heart sounds.   Pulmonary/Chest: Effort normal and breath sounds normal. No respiratory distress. She has no wheezes.  Lymphadenopathy:    She has no cervical adenopathy.          Assessment & Plan:

## 2013-07-23 NOTE — Progress Notes (Signed)
Pre visit review using our clinic review tool, if applicable. No additional management support is needed unless otherwise documented below in the visit note. 

## 2013-07-23 NOTE — Assessment & Plan Note (Signed)
Pt's sxs and PE consistent w/ infxn.  Start abx.  Reviewed supportive care and red flags that should prompt return.  Pt expressed understanding and is in agreement w/ plan.  

## 2013-07-23 NOTE — Patient Instructions (Signed)
Follow up as needed Start the Amoxicillin twice daily Drink plenty of fluids Call with any questions or concerns Hang in there!!!

## 2013-09-14 ENCOUNTER — Encounter: Payer: Self-pay | Admitting: Family Medicine

## 2013-09-14 ENCOUNTER — Ambulatory Visit (INDEPENDENT_AMBULATORY_CARE_PROVIDER_SITE_OTHER): Payer: 59 | Admitting: Family Medicine

## 2013-09-14 VITALS — BP 120/82 | HR 99 | Temp 98.2°F | Resp 18 | Wt 123.0 lb

## 2013-09-14 DIAGNOSIS — D2260 Melanocytic nevi of unspecified upper limb, including shoulder: Secondary | ICD-10-CM | POA: Insufficient documentation

## 2013-09-14 DIAGNOSIS — D2360 Other benign neoplasm of skin of unspecified upper limb, including shoulder: Secondary | ICD-10-CM

## 2013-09-14 NOTE — Progress Notes (Signed)
   Subjective:    Patient ID: Kelsey [name], female    DOB: 08/26/1975, 38 y.o.   MRN: [id number]  HPI Mole- R ring finger, in last 2-3 weeks mole has changed size, shape, is now raised and itchy/uncomfortable.  Will form scabs.   Review of Systems For ROS see HPI     Objective:   Physical Exam  Vitals reviewed. Constitutional: She appears well-developed and well-nourished. No distress.  Skin: Skin is warm and dry.  1 cm diameter tan slightly raised and mildly atypical in pigmentation macule on dorsum of R ring finger          Assessment & Plan:

## 2013-09-14 NOTE — Patient Instructions (Signed)
We'll call you with your Derm appt Call with any questions or concerns Hang in there!!!

## 2013-09-14 NOTE — Assessment & Plan Note (Signed)
New.  Pt reports multiple changes to mole.  Refer to Derm for evaluation and removal.

## 2013-11-12 ENCOUNTER — Encounter: Payer: Self-pay | Admitting: Nurse Practitioner

## 2013-11-12 ENCOUNTER — Ambulatory Visit (INDEPENDENT_AMBULATORY_CARE_PROVIDER_SITE_OTHER): Payer: 59 | Admitting: Nurse Practitioner

## 2013-11-12 VITALS — BP 110/73 | HR 80 | Temp 98.0°F | Resp 18 | Ht 62.5 in | Wt 121.0 lb

## 2013-11-12 DIAGNOSIS — J019 Acute sinusitis, unspecified: Secondary | ICD-10-CM

## 2013-11-12 DIAGNOSIS — R059 Cough, unspecified: Secondary | ICD-10-CM

## 2013-11-12 DIAGNOSIS — R05 Cough: Secondary | ICD-10-CM

## 2013-11-12 MED ORDER — GUAIFENESIN 400 MG PO TABS: ORAL_TABLET | ORAL | Status: DC

## 2013-11-12 MED ORDER — AMOXICILLIN-POT CLAVULANATE 875-125 MG PO TABS: 1.0000 | ORAL_TABLET | Freq: Two times a day (BID) | ORAL | Status: DC

## 2013-11-12 MED ORDER — ALBUTEROL SULFATE HFA 108 (90 BASE) MCG/ACT IN AERS: 2.0000 | INHALATION_SPRAY | RESPIRATORY_TRACT | Status: DC | PRN

## 2013-11-12 NOTE — Patient Instructions (Signed)
Start antibiotic. Because you have a history of asthma, I want you to start using inhaler twice daily for several days, then as needed. Also use guaifenesn as prescribed. Please call for re-evaluation if you are not improving or develop chest pain with inspiration or fever.  Sinusitis Sinusitis is redness, soreness, and swelling (inflammation) of the paranasal sinuses. Paranasal sinuses are air pockets within the bones of your face (beneath the eyes, the middle of the forehead, or above the eyes). In healthy paranasal sinuses, mucus is able to drain out, and air is able to circulate through them by way of your nose. However, when your paranasal sinuses are inflamed, mucus and air can become trapped. This can allow bacteria and other germs to grow and cause infection. Sinusitis can develop quickly and last only a short time (acute) or continue over a long period (chronic). Sinusitis that lasts for more than 12 weeks is considered chronic.  CAUSES  Causes of sinusitis include:  Allergies.  Structural abnormalities, such as displacement of the cartilage that separates your nostrils (deviated septum), which can decrease the air flow through your nose and sinuses and affect sinus drainage.  Functional abnormalities, such as when the small hairs (cilia) that line your sinuses and help remove mucus do not work properly or are not present. SYMPTOMS  Symptoms of acute and chronic sinusitis are the same. The primary symptoms are pain and pressure around the affected sinuses. Other symptoms include:  Upper toothache.  Earache.  Headache.  Bad breath.  Decreased sense of smell and taste.  A cough, which worsens when you are lying flat.  Fatigue.  Fever.  Thick drainage from your nose, which often is green and may contain pus (purulent).  Swelling and warmth over the affected sinuses. DIAGNOSIS  Your caregiver will perform a physical exam. During the exam, your caregiver may:  Look in your  nose for signs of abnormal growths in your nostrils (nasal polyps).  Tap over the affected sinus to check for signs of infection.  View the inside of your sinuses (endoscopy) with a special imaging device with a light attached (endoscope), which is inserted into your sinuses. If your caregiver suspects that you have chronic sinusitis, one or more of the following tests may be recommended:  Allergy tests.  Nasal culture A sample of mucus is taken from your nose and sent to a lab and screened for bacteria.  Nasal cytology A sample of mucus is taken from your nose and examined by your caregiver to determine if your sinusitis is related to an allergy. TREATMENT  Most cases of acute sinusitis are related to a viral infection and will resolve on their own within 10 days. Sometimes medicines are prescribed to help relieve symptoms (pain medicine, decongestants, nasal steroid sprays, or saline sprays).  However, for sinusitis related to a bacterial infection, your caregiver will prescribe antibiotic medicines. These are medicines that will help kill the bacteria causing the infection.  Rarely, sinusitis is caused by a fungal infection. In theses cases, your caregiver will prescribe antifungal medicine. For some cases of chronic sinusitis, surgery is needed. Generally, these are cases in which sinusitis recurs more than 3 times per year, despite other treatments. HOME CARE INSTRUCTIONS   Drink plenty of water. Water helps thin the mucus so your sinuses can drain more easily.  Use a humidifier.  Inhale steam 3 to 4 times a day (for example, sit in the bathroom with the shower running).  Apply a warm, moist washcloth  to your face 3 to 4 times a day, or as directed by your caregiver.  Use saline nasal sprays to help moisten and clean your sinuses.  Take over-the-counter or prescription medicines for pain, discomfort, or fever only as directed by your caregiver. SEEK IMMEDIATE MEDICAL CARE  IF:  You have increasing pain or severe headaches.  You have nausea, vomiting, or drowsiness.  You have swelling around your face.  You have vision problems.  You have a stiff neck.  You have difficulty breathing. MAKE SURE YOU:   Understand these instructions.  Will watch your condition.  Will get help right away if you are not doing well or get worse. Document Released: 06/24/2005 Document Revised: 09/16/2011 Document Reviewed: 07/09/2011 [facility] Patient Information 2014 [place], [place].  Acute Bronchitis Bronchitis is inflammation of the airways that extend from the windpipe into the lungs (bronchi). The inflammation often causes mucus to develop. This leads to a cough, which is the most common symptom of bronchitis.  In acute bronchitis, the condition usually develops suddenly and goes away over time, usually in a couple weeks. Smoking, allergies, and asthma can make bronchitis worse. Repeated episodes of bronchitis may cause further lung problems.  CAUSES Acute bronchitis is most often caused by the same virus that causes a cold. The virus can spread from person to person (contagious).  SIGNS AND SYMPTOMS   Cough.   Fever.   Coughing up mucus.   Body aches.   Chest congestion.   Chills.   Shortness of breath.   Sore throat.  DIAGNOSIS  Acute bronchitis is usually diagnosed through a physical exam. Tests, such as chest X-rays, are sometimes done to rule out other conditions.  TREATMENT  Acute bronchitis usually goes away in a couple weeks. Often times, no medical treatment is necessary. Medicines are sometimes given for relief of fever or cough. Antibiotics are usually not needed but may be prescribed in certain situations. In some cases, an inhaler may be recommended to help reduce shortness of breath and control the cough. A cool mist vaporizer may also be used to help thin bronchial secretions and make it easier to clear the chest.  HOME CARE  INSTRUCTIONS  Get plenty of rest.   Drink enough fluids to keep your urine clear or pale yellow (unless you have a medical condition that requires fluid restriction). Increasing fluids may help thin your secretions and will prevent dehydration.   Only take over-the-counter or prescription medicines as directed by your health care provider.   Avoid smoking and secondhand smoke. Exposure to cigarette smoke or irritating chemicals will make bronchitis worse. If you are a smoker, consider using nicotine gum or skin patches to help control withdrawal symptoms. Quitting smoking will help your lungs heal faster.   Reduce the chances of another bout of acute bronchitis by washing your hands frequently, avoiding people with cold symptoms, and trying not to touch your hands to your mouth, nose, or eyes.   Follow up with your health care provider as directed.  SEEK MEDICAL CARE IF: Your symptoms do not improve after 1 week of treatment.  SEEK IMMEDIATE MEDICAL CARE IF:  You develop an increased fever or chills.   You have chest pain.   You have severe shortness of breath.  You have bloody sputum.   You develop dehydration.  You develop fainting.  You develop repeated vomiting.  You develop a severe headache. MAKE SURE YOU:   Understand these instructions.  Will watch your condition.  Will  get help right away if you are not doing well or get worse. Document Released: 08/01/2004 Document Revised: 02/24/2013 Document Reviewed: 12/15/2012 [facility] Patient Information 2014 [place].

## 2013-11-12 NOTE — Progress Notes (Signed)
   Subjective:    Patient ID: Kelsey [name], female    DOB: 1976-04-09, 38 y.o.   MRN: [id number]  Sinusitis This is a recurrent problem. The current episode started 1 to 4 weeks ago. The problem has been gradually worsening since onset. The maximum temperature recorded prior to her arrival was 100 - 100.9 F. The fever has been present for 1 to 2 days. The pain is mild. Associated symptoms include congestion, coughing, ear pain, headaches, shortness of breath, sinus pressure and a sore throat. Pertinent negatives include no chills, sneezing or swollen glands. Past treatments include antibiotics (had left over amoxicillin-took for 3 days.). The treatment provided mild relief.      Review of Systems  Constitutional: Positive for fever (resolved) and fatigue. Negative for chills, activity change and appetite change.  HENT: Positive for congestion, ear pain, postnasal drip, sinus pressure and sore throat. Negative for sneezing.   Respiratory: Positive for cough and shortness of breath. Negative for chest tightness and wheezing.   Cardiovascular: Negative for chest pain.  Gastrointestinal: Negative for abdominal pain.  Musculoskeletal: Negative for back pain.  Neurological: Positive for headaches.  Hematological: Negative for adenopathy.       Objective:   Physical Exam  Vitals reviewed. Constitutional: She is oriented to person, place, and time. She appears well-developed and well-nourished.  HENT:  Head: Normocephalic and atraumatic.  Right Ear: External ear normal.  Left Ear: External ear normal.  Mouth/Throat: Oropharynx is clear and moist. No oropharyngeal exudate.  Erythematous nasal mucosa  Eyes: Conjunctivae are normal. Right eye exhibits no discharge. Left eye exhibits no discharge.  Neck: Normal range of motion. Neck supple. No thyromegaly present.  Cardiovascular: Normal rate and normal heart sounds.   No murmur heard. Pulmonary/Chest: Effort normal and breath sounds  normal. No respiratory distress. She has no wheezes. She has no rales.  Course breath sounds on expiration, bilat  Lymphadenopathy:    She has no cervical adenopathy.  Neurological: She is alert and oriented to person, place, and time.  Skin: Skin is warm and dry.  Psychiatric: She has a normal mood and affect. Her behavior is normal. Thought content normal.          Assessment & Plan:  1. Acute sinus infection recurrent - amoxicillin-clavulanate (AUGMENTIN) 875-125 MG per tablet; Take 1 tablet by mouth 2 (two) times daily.  Dispense: 10 tablet; Refill: 0   2. Cough Likely asthma flare - guaifenesin (HUMIBID E) 400 MG TABS tablet; Take 1t po q8h for 5 days then q8h PRN chest congestion  Dispense: 56 tablet; Refill: 0 - albuterol (PROVENTIL HFA;VENTOLIN HFA) 108 (90 BASE) MCG/ACT inhaler; Inhale 2 puffs into the lungs every 4 (four) hours as needed for wheezing.  Dispense: 1 Inhaler; Refill: 0

## 2013-11-19 ENCOUNTER — Telehealth: Payer: Self-pay | Admitting: Family Medicine

## 2013-11-19 MED ORDER — AMOXICILLIN 875 MG PO TABS: 875.0000 mg | ORAL_TABLET | Freq: Two times a day (BID) | ORAL | Status: DC

## 2013-11-19 NOTE — Telephone Encounter (Signed)
Please send Amoxicillin 875mg  BID x10 days to treat sinusitis

## 2013-11-19 NOTE — Telephone Encounter (Signed)
Med filled. Pt notified.  

## 2013-11-19 NOTE — Telephone Encounter (Signed)
Caller name: Rafaelita   Call back number: [phone] Pharmacy: [facility] [address]  Reason for call:  Pt saw Nicky Pugh on 11/12/13  For a sinus infection.  Pt is not feeling any better.  Pt states that she got a different antibiotic than she usually gets and wanted to know if Dr. Birdie Riddle would prescribed the antibiotic that she usually gives the pt.    Pt would like to be notified one or another in regards to the request.

## 2013-11-27 ENCOUNTER — Encounter: Payer: Self-pay | Admitting: Family Medicine

## 2013-11-27 ENCOUNTER — Ambulatory Visit (INDEPENDENT_AMBULATORY_CARE_PROVIDER_SITE_OTHER): Payer: 59 | Admitting: Family Medicine

## 2013-11-27 VITALS — BP 106/70 | HR 69 | Temp 97.2°F | Ht 62.5 in | Wt 124.2 lb

## 2013-11-27 DIAGNOSIS — J329 Chronic sinusitis, unspecified: Secondary | ICD-10-CM

## 2013-11-27 MED ORDER — FLUCONAZOLE 150 MG PO TABS: 150.0000 mg | ORAL_TABLET | Freq: Once | ORAL | Status: DC

## 2013-11-27 MED ORDER — LEVOFLOXACIN 500 MG PO TABS: 500.0000 mg | ORAL_TABLET | Freq: Every day | ORAL | Status: DC

## 2013-11-27 NOTE — Patient Instructions (Signed)
Continue antihistamine such as Zyrtec. Add Flonase 2 sprays per nostril once daily Consider addition of Levaquin 500 mg once daily if symptoms not improving over the next couple days

## 2013-11-27 NOTE — Progress Notes (Signed)
Pre visit review using our clinic review tool, if applicable. No additional management support is needed unless otherwise documented below in the visit note. 

## 2013-11-27 NOTE — Progress Notes (Signed)
   Subjective:    Patient ID: Kelsey [name], female    DOB: 1976/02/18, 38 y.o.   MRN: [id number]  HPI Three-week history of recurrent sinus symptoms. She has history of recurrent sinusitis in the past with similar symptoms..  About 3 and a half weeks ago she developed some sore throat, fatigue, headaches, cough, and maxillary facial pain. She initially was treated with 5 days of Augmentin but did not improve. Subsequently received amoxicillin and is just finishing that. She feels no better. She's having some nighttime wheezing. She just started Zyrtec couple days ago. She's had some minimal anterior cervical adenopathy. She has persistent bilateral maxillary facial pain, sinus congestion, intermittent headaches, sore throat. No purulent secretions. No fever. Intermittent chills. Increased malaise.  Past Medical History  Diagnosis Date  . Abnormal Pap smear   . Anemia   . Chronic sinusitis   . Pyelonephritis 2008    Pylo and stones in the past  . Sinus problem     sinusitis  . [id number](315.1)    Past Surgical History  Procedure Laterality Date  . Cesarean section    . Knee arthroscopy      right knee  . Colposcopy    . Wisdom tooth extraction    . Cesarean section  08/31/2011    Procedure: CESAREAN SECTION;  Surgeon: [name], MD;  Location: [facility];  Service: Gynecology;  Laterality: [name];  Repeat cesarean section with delivery of baby     reports that she has never smoked. She has never used smokeless tobacco. She reports that she does not drink alcohol or use illicit drugs. family history includes Asthma in her brother; Coronary artery disease in her maternal grandfather; Diabetes in her maternal grandfather; Heart disease in her brother; Heart disease (age of onset: 29) in her maternal grandfather; Kidney disease in her maternal grandfather. There is no history of Cancer, Stroke, or Hypertension. Allergies  Allergen Reactions  . Morphine And Related Hives  . Sulfa  Antibiotics Nausea And Vomiting      Review of Systems  Constitutional: Positive for chills and fatigue. Negative for fever.  HENT: Positive for congestion, sinus pressure and sore throat. Negative for facial swelling and nosebleeds.   Respiratory: Positive for cough.   Neurological: Positive for headaches.       Objective:   Physical Exam  Constitutional: She appears well-developed and well-nourished. No distress.  HENT:  Right Ear: External ear normal.  Left Ear: External ear normal.  Nose: Nose normal.  Mouth/Throat: Oropharynx is clear and moist.  Neck: Neck supple.  Minimal anterior cervical nodes.  No posterior cervical nodes.  Cardiovascular: Normal rate.   Pulmonary/Chest: Effort normal and breath sounds normal. No respiratory distress. She has no wheezes. She has no rales.          Assessment & Plan:  Patient has constitutional symptoms of malaise, sore throat, headache, facial pain of 3+weeks duration. Differential is allergic rhinitis versus possible persistent/chronic sinusitis. Add Flonase. Continue antihistamine. Consider Levaquin 500 milligrams once daily for 10 days if not improving over the next few days. If not improving after that consider limited CT sinuses

## 2014-03-24 ENCOUNTER — Ambulatory Visit (INDEPENDENT_AMBULATORY_CARE_PROVIDER_SITE_OTHER): Payer: 59 | Admitting: Family Medicine

## 2014-03-24 ENCOUNTER — Encounter: Payer: Self-pay | Admitting: Family Medicine

## 2014-03-24 VITALS — BP 120/80 | HR 87 | Temp 98.0°F | Resp 16 | Wt 124.5 lb

## 2014-03-24 DIAGNOSIS — Z23 Encounter for immunization: Secondary | ICD-10-CM

## 2014-03-24 DIAGNOSIS — R1013 Epigastric pain: Secondary | ICD-10-CM

## 2014-03-24 MED ORDER — OMEPRAZOLE 20 MG PO CPDR: 20.0000 mg | DELAYED_RELEASE_CAPSULE | Freq: Every day | ORAL | Status: DC

## 2014-03-24 NOTE — Assessment & Plan Note (Addendum)
New.  Suspect this is due to combo of stress, decreased oral intake, and increased acid production.  Check labs.  Start PPI.  Reviewed supportive care and red flags that should prompt return.  Pt expressed understanding and is in agreement w/ plan.

## 2014-03-24 NOTE — Progress Notes (Signed)
Pre visit review using our clinic review tool, if applicable. No additional management support is needed unless otherwise documented below in the visit note. 

## 2014-03-24 NOTE — Progress Notes (Signed)
   Subjective:    Patient ID: Kelsey [name], female    DOB: 05-15-1976, 38 y.o.   MRN: [id number]  HPI abd pain- pt reports increased stress (work and home).  Pt reports inability to eat in the 'last couple of weeks'.  'constant nervous stomach'.  + nausea, GERD.  Epigastric pain.  No vomiting.  No weight change since March.  Intermittent loose stools.   Review of Systems For ROS see HPI     Objective:   Physical Exam  Vitals reviewed. Constitutional: She is oriented to person, place, and time. She appears well-developed and well-nourished. No distress.  Cardiovascular: Normal rate, regular rhythm, normal heart sounds and intact distal pulses.   Pulmonary/Chest: Effort normal and breath sounds normal. No respiratory distress. She has no wheezes. She has no rales.  Abdominal: Soft. Bowel sounds are normal. She exhibits no distension. There is no tenderness. There is no rebound and no guarding.  Neurological: She is alert and oriented to person, place, and time.  Skin: Skin is warm and dry.          Assessment & Plan:

## 2014-03-24 NOTE — Patient Instructions (Signed)
Follow up as needed Start the Omeprazole daily to reduce the acid Make sure you are eating regularly- graze! We'll notify you of your lab results and make any changes if needed Call with any questions or concerns Hang in there!!!

## 2014-03-25 LAB — HEPATIC FUNCTION PANEL
ALT: 22 U/L (ref 0–35)
AST: 20 U/L (ref 0–37)
Albumin: 4.7 g/dL (ref 3.5–5.2)
Alkaline Phosphatase: 41 U/L (ref 39–117)
Bilirubin, Direct: 0.1 mg/dL (ref 0.0–0.3)
Total Bilirubin: 0.7 mg/dL (ref 0.2–1.2)
Total Protein: 7.4 g/dL (ref 6.0–8.3)

## 2014-03-25 LAB — CBC WITH DIFFERENTIAL/PLATELET
Basophils Absolute: 0 10*3/uL (ref 0.0–0.1)
Basophils Relative: 0.3 % (ref 0.0–3.0)
Eosinophils Absolute: 0.1 10*3/uL (ref 0.0–0.7)
Eosinophils Relative: 1.5 % (ref 0.0–5.0)
HCT: 39.4 % (ref 36.0–46.0)
Hemoglobin: 13.2 g/dL (ref 12.0–15.0)
Lymphocytes Relative: 29.4 % (ref 12.0–46.0)
Lymphs Abs: 1.5 10*3/uL (ref 0.7–4.0)
MCHC: 33.4 g/dL (ref 30.0–36.0)
MCV: 90.5 fl (ref 78.0–100.0)
Monocytes Absolute: 0.4 10*3/uL (ref 0.1–1.0)
Monocytes Relative: 7.4 % (ref 3.0–12.0)
Neutro Abs: 3.2 10*3/uL (ref 1.4–7.7)
Neutrophils Relative %: 61.4 % (ref 43.0–77.0)
Platelets: 226 10*3/uL (ref 150.0–400.0)
RBC: 4.36 Mil/uL (ref 3.87–5.11)
RDW: 13.7 % (ref 11.5–15.5)
WBC: 5.3 10*3/uL (ref 4.0–10.5)

## 2014-03-25 LAB — BASIC METABOLIC PANEL
BUN: 7 mg/dL (ref 6–23)
CO2: 22 mEq/L (ref 19–32)
Calcium: 9.2 mg/dL (ref 8.4–10.5)
Chloride: 107 mEq/L (ref 96–112)
Creatinine, Ser: 1 mg/dL (ref 0.4–1.2)
GFR: 69.52 mL/min (ref >60.00)
Glucose, Bld: 89 mg/dL (ref 70–99)
Potassium: 3.9 mEq/L (ref 3.5–5.1)
Sodium: 138 mEq/L (ref 135–145)

## 2014-03-25 LAB — H. PYLORI ANTIBODY, IGG: H Pylori IgG: NEGATIVE

## 2014-03-25 LAB — LIPASE: Lipase: 34 U/L (ref 11.0–59.0)

## 2014-04-20 ENCOUNTER — Ambulatory Visit (INDEPENDENT_AMBULATORY_CARE_PROVIDER_SITE_OTHER): Payer: 59 | Admitting: Family Medicine

## 2014-04-20 ENCOUNTER — Encounter: Payer: Self-pay | Admitting: Family Medicine

## 2014-04-20 VITALS — BP 125/78 | HR 88 | Ht 63.0 in | Wt 120.0 lb

## 2014-04-20 VITALS — BP 110/80 | HR 76 | Temp 98.2°F | Resp 16 | Wt 125.4 lb

## 2014-04-20 DIAGNOSIS — M79632 Pain in left forearm: Secondary | ICD-10-CM | POA: Insufficient documentation

## 2014-04-20 MED ORDER — MELOXICAM 15 MG PO TABS: 15.0000 mg | ORAL_TABLET | Freq: Every day | ORAL | Status: DC

## 2014-04-20 NOTE — Patient Instructions (Signed)
You have strained your flexor carpi ulnaris muscle (grade 1-2). I'd expect this to take 1-4 weeks to recover from. Icing 15 minutes at a time 3-4 times a day for 72 hours then switch to heat for 15 minutes at a time 3-4 times a day. Wrist brace regularly to help prevent further strain. Come out of this 1-2 times a day to do simple stretches of your wrist. Ibuprofen 600mg  three times a day with food OR aleve 2 tabs twice a day with food for pain and inflammation. Consider physical therapy if not improving as expected. Consider compression sleeve of your forearm as well. Follow up with me in 4 weeks or as needed.

## 2014-04-20 NOTE — Patient Instructions (Signed)
See Dr Barbaraann Barthel upstairs on the 3rd floor at 1:30 ICE Mobic for inflammation and pain Call with any questions or concerns Hang in there!!!

## 2014-04-20 NOTE — Progress Notes (Signed)
   Subjective:    Patient ID: Kelsey [name], female    DOB: [birth year], 38 y.o.   MRN: [id number]  HPI L arm pain- pt was doing 35 lb snatches and when she pushed up w/ L arm she heard 2 'pops' and had immediate pain.  This occurred yesterday.  Now w/ some swelling and bruising.  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   Review of Systems For ROS see HPI     Objective:   Physical Exam  Vitals reviewed. Constitutional: She is oriented to person, place, and time. She appears well-developed and well-nourished. No distress.  Cardiovascular: Intact distal pulses.   Musculoskeletal: She exhibits edema and tenderness (over pronator teres and flexor carpi radialis w/ swelling and soft tissue mass).  Neurological: She is alert and oriented to person, place, and time. She has normal reflexes.          Assessment & Plan:

## 2014-04-20 NOTE — Assessment & Plan Note (Signed)
New.  Concern for torn muscle.  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  Start NSAIDs.  Ice.  Reviewed supportive care and red flags that should prompt return.  Pt expressed understanding and is in agreement w/ plan.

## 2014-04-20 NOTE — Progress Notes (Signed)
Pre visit review using our clinic review tool, if applicable. No additional management support is needed unless otherwise documented below in the visit note. 

## 2014-04-25 ENCOUNTER — Encounter: Payer: Self-pay | Admitting: Family Medicine

## 2014-04-25 NOTE — Progress Notes (Signed)
Patient ID: Kelsey [name], female   DOB: 1975/11/03, 38 y.o.   MRN: [id number]  PCP and referred by: Annye Asa, MD  Subjective:   HPI: Patient is a 38 y.o. female here for left forearm injury.  Patient reports on 10/13 she was pushing a 35 lb kettlebell overhead with wrist flexed when she felt a sharp pop (maybe 2 pops) and pain in left forearm. Pain has improved some since then, now to 4/10. No prior injuries to this area. Has been resting.  Some discoloration and feels a know in proximal forearm.  Some discoloration. 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  Past Medical History  Diagnosis Date  . Abnormal Pap smear   . Anemia   . Chronic sinusitis   . Pyelonephritis 2008    Pylo and stones in the past  . Sinus problem     sinusitis  . LGXQJJHE(174.0)     Current Outpatient Prescriptions on File Prior to Visit  Medication Sig Dispense Refill  . lamoTRIgine (LAMICTAL) 25 MG tablet Take 100 mg by mouth daily.       Marland Kitchen topiramate (TOPAMAX) 50 MG tablet Take 1 tablet (50 mg total) by mouth 2 (two) times daily.  60 tablet  3  . albuterol (PROVENTIL HFA;VENTOLIN HFA) 108 (90 BASE) MCG/ACT inhaler Inhale 2 puffs into the lungs every 4 (four) hours as needed for wheezing.  1 Inhaler  0  . Ascorbic Acid (VITAMIN C) 500 MG CAPS Take 1 tablet by mouth daily.       . Betamethasone Valerate 0.12 % foam Apply 1 application topically daily.  100 g  6  . Calcium Carbonate-Vitamin D (CALCIUM 600/VITAMIN D) 600-400 MG-UNIT per tablet Take 1 tablet by mouth daily.      . clobetasol ointment (TEMOVATE) 0.05 %       . fish oil-omega-3 fatty acids 1000 MG capsule Take 1 g by mouth daily.       Marland Kitchen FOLIC ACID PO Take 814 mcg by mouth daily.       . IRON PO Take 65 mg by mouth daily.       . meloxicam (MOBIC) 15 MG tablet Take 1 tablet (15 mg total) by mouth daily.  30 tablet  1  . omeprazole (PRILOSEC) 20 MG capsule Take 1 capsule (20 mg total)  by mouth daily.  30 capsule  3  . OVER THE COUNTER MEDICATION Take 1 tablet by mouth daily. Over the counter, POTASSIUM 550mg  supplement      . urea (CARMOL) 40 % CREA Apply topically bid.  198.6 each  3  . valACYclovir (VALTREX) 500 MG tablet Take 500 mg by mouth daily.       No current facility-administered medications on file prior to visit.    Past Surgical History  Procedure Laterality Date  . Cesarean section    . Knee arthroscopy      right knee  . Colposcopy    . Wisdom tooth extraction    . Cesarean section  08/31/2011    Procedure: CESAREAN SECTION;  Surgeon: [name], MD;  Location: [facility];  Service: Gynecology;  Laterality: [name];  Repeat cesarean section with delivery of baby     Allergies  Allergen Reactions  . Morphine And Related Hives  . Sulfa Antibiotics Nausea And Vomiting    History   Social History  . Marital Status: Married    Spouse Name: [name]    Number  of Children: 2  . Years of Education: [name]   Occupational History  . clinical Education officer, [employer]    Social History Main Topics  . Smoking status: Never Smoker   . Smokeless tobacco: Never Used  . Alcohol Use: No  . Drug Use: No  . Sexual Activity: Yes   Other Topics Concern  . Not on file   Social History Narrative   1 daughter--[name] born 12/09, 1 step son    Family History  Problem Relation Age of Onset  . Heart disease Brother     sudden cardiac death  . Asthma Brother   . Heart disease Maternal Grandfather 26    MI  . Coronary artery disease Maternal Grandfather   . Diabetes Maternal Grandfather   . Kidney disease Maternal Grandfather   . Cancer Neg Hx   . Stroke Neg Hx   . Hypertension Neg Hx     BP 125/78  Pulse 88  Ht 5\' 3"  (1.6 m)  Wt 120 lb (54.432 kg)  BMI 21.26 kg/m2  Review of Systems: See HPI above.    Objective:  Physical Exam:  Gen: NAD  Left forearm: No gross deformity, swelling, bruising. TTP volar radial aspect proximal forearm within  musculature with questionable knot here - tender.  No epicondyle, other tenderness of left arm. FROM elbow and wrist. Pain with resisted wrist flexion.  No pain with finger flexion, wrist extension, elbow flexion/extension. NVI distally.  MSK u/s:  No evidence cortical irregularity, neovascularity, edema overlying cortex of radius.  No evidence tendon tear or swelling seen surrounding musculature of forearm.    Assessment & Plan:  1. Left forearm strain - consistent specifically with injury to flexor carpi ulnaris muscle (likely grade 1-2 based on lack of findings on ultrasound).  Expect 1-4 weeks to heal.  Icing, wrist brace.  NSAIDs regularly for 7-10 days then as needed.  Consider compression sleeve.  She has to go ahead with certification course early next week - will rest as much as possible between now and then - discussed avoiding anything involving flexing wrist when working out.  F/u in 4 weeks or prn.  Consider PT if not improving as expected.

## 2014-04-25 NOTE — Assessment & Plan Note (Signed)
consistent specifically with injury to flexor carpi ulnaris muscle (likely grade 1-2 based on lack of findings on ultrasound).  Expect 1-4 weeks to heal.  Icing, wrist brace.  NSAIDs regularly for 7-10 days then as needed.  Consider compression sleeve.  She has to go ahead with certification course early next week - will rest as much as possible between now and then - discussed avoiding anything involving flexing wrist when working out.  F/u in 4 weeks or prn.  Consider PT if not improving as expected.

## 2014-05-09 ENCOUNTER — Encounter: Payer: Self-pay | Admitting: Family Medicine

## 2014-08-15 ENCOUNTER — Encounter: Payer: Self-pay | Admitting: Internal Medicine

## 2014-08-15 ENCOUNTER — Ambulatory Visit (INDEPENDENT_AMBULATORY_CARE_PROVIDER_SITE_OTHER): Payer: Managed Care, Other (non HMO) | Admitting: Internal Medicine

## 2014-08-15 VITALS — BP 130/79 | HR 76 | Temp 98.1°F | Ht 63.0 in | Wt 122.4 lb

## 2014-08-15 DIAGNOSIS — J4541 Moderate persistent asthma with (acute) exacerbation: Secondary | ICD-10-CM

## 2014-08-15 DIAGNOSIS — R059 Cough, unspecified: Secondary | ICD-10-CM

## 2014-08-15 DIAGNOSIS — R05 Cough: Secondary | ICD-10-CM

## 2014-08-15 MED ORDER — BUDESONIDE-FORMOTEROL FUMARATE 160-4.5 MCG/ACT IN AERO: 2.0000 | INHALATION_SPRAY | Freq: Two times a day (BID) | RESPIRATORY_TRACT | Status: DC

## 2014-08-15 MED ORDER — ALBUTEROL SULFATE HFA 108 (90 BASE) MCG/ACT IN AERS
2.0000 | INHALATION_SPRAY | RESPIRATORY_TRACT | Status: DC | PRN
Start: 2014-08-15 — End: 2015-08-29

## 2014-08-15 NOTE — Progress Notes (Signed)
Pre visit review using our clinic review tool, if applicable. No additional management support is needed unless otherwise documented below in the visit note. 

## 2014-08-15 NOTE — Patient Instructions (Signed)
Start Symbicort 2 puffs twice a day Continue using albuterol as needed for cough, difficulty breathing or wheezing  If you are not gradually improving in the next few days please let us know If you've worse please call the office  Please see your primary doctor in 2 or 3 weeks

## 2014-08-15 NOTE — Progress Notes (Signed)
Subjective:    Patient ID: Kelsey [name], female    DOB: 1975-10-08, 39 y.o.   MRN: [id number]  DOS:  08/15/2014 Type of visit - description : acute Interval history:  Patient thinks her asthma is exacerbated for the last 3-4 weeks, symptoms described as chest tightness, difficulty taking a deep breath, symptoms decrease with albuterol. Prior to the onset of symptoms, she uses albuterol only before running, now she feels she needs it 4 or 5 times a day. Had a URI that self resolved a few days prior to this exacerbation. BP is usually in the 90s, she is very active. Today BP seems to be elevated.  Review of Systems Denies fever chills, she does have some postnasal dripping but not nasal congestion or green nasal discharge. "Does not feel like sinusitis". Denies GERD symptoms Mild cough associated with a very small amount of clear sputum production. No recent prolonged car trip or airplane trip. No calf pain or swelling.   Past Medical History  Diagnosis Date  . Abnormal Pap smear   . Anemia   . Chronic sinusitis   . Pyelonephritis 2008    Pylo and stones in the past  . Sinus problem     sinusitis  . IONGEXBM(841.3)     Past Surgical History  Procedure Laterality Date  . Cesarean section    . Knee arthroscopy      right knee  . Colposcopy    . Wisdom tooth extraction    . Cesarean section  08/31/2011    Procedure: CESAREAN SECTION;  Surgeon: [name], MD;  Location: [facility];  Service: Gynecology;  Laterality: [name];  Repeat cesarean section with delivery of baby     History   Social History  . Marital Status: Married    Spouse Name: [name]    Number of Children: 2  . Years of Education: [name]   Occupational History  . clinical Education officer, museum    Social History Main Topics  . Smoking status: Never Smoker   . Smokeless tobacco: Never Used  . Alcohol Use: No  . Drug Use: No  . Sexual Activity: Yes   Other Topics Concern  . Not on file   Social History  Narrative   1 daughter--[name] born 12/09, 1 step son        Medication List       This list is accurate as of: 08/15/14  7:20 PM.  Always use your most recent med list.               albuterol 108 (90 BASE) MCG/ACT inhaler  Commonly known as:  PROVENTIL HFA;VENTOLIN HFA  Inhale 2 puffs into the lungs every 4 (four) hours as needed for wheezing.     Betamethasone Valerate 0.12 % foam  Apply 1 application topically daily.     budesonide-formoterol 160-4.5 MCG/ACT inhaler  Commonly known as:  SYMBICORT  Inhale 2 puffs into the lungs 2 (two) times daily.     CALCIUM 600/VITAMIN D 600-400 MG-UNIT per tablet  Generic drug:  Calcium Carbonate-Vitamin D  Take 1 tablet by mouth daily.     clobetasol ointment 0.05 %  Commonly known as:  TEMOVATE     fish oil-omega-3 fatty acids 1000 MG capsule  Take 1 g by mouth daily.     FLUoxetine 20 MG tablet  Commonly known as:  PROZAC  Take 20 mg by mouth daily.     FOLIC ACID PO  Take 244 mcg  by mouth daily.     IRON PO  Take 65 mg by mouth daily.     lamoTRIgine 25 MG tablet  Commonly known as:  LAMICTAL  Take 100 mg by mouth daily.     levonorgestrel 20 MCG/24HR IUD  Commonly known as:  MIRENA  1 each by Intrauterine route once.     meloxicam 15 MG tablet  Commonly known as:  MOBIC  Take 1 tablet (15 mg total) by mouth daily.     omeprazole 20 MG capsule  Commonly known as:  PRILOSEC  Take 1 capsule (20 mg total) by mouth daily.     OVER THE COUNTER MEDICATION  Take 1 tablet by mouth daily. Over the counter, POTASSIUM 550mg  supplement     topiramate 100 MG tablet  Commonly known as:  TOPAMAX  Take 100 mg by mouth 2 (two) times daily.     urea 40 % Crea  Commonly known as:  CARMOL  Apply topically bid.     valACYclovir 500 MG tablet  Commonly known as:  VALTREX  Take 500 mg by mouth daily.     Vitamin C 500 MG Caps  Take 1 tablet by mouth daily.           Objective:   Physical Exam    Constitutional: She is oriented to person, place, and time. She appears well-developed. No distress.  HENT:  Head: Normocephalic and atraumatic.  Right Ear: External ear normal.  Left Ear: External ear normal.  Nose: Nose normal.  No nasal congestion  Cardiovascular:  RRR, no murmur, rub or gallop  Pulmonary/Chest: Effort normal. No respiratory distress.  Mild cough elicited with deep breaths, CTA B but expiratory time seems prolonged.  Musculoskeletal: She exhibits no edema (calves  symmetric and not tender).  Neurological: She is alert and oriented to person, place, and time. No cranial nerve deficit. She exhibits normal muscle tone. Coordination normal.  Speech normal, gait unassisted and normal for age, motor strength appropriate for age   Skin: Skin is warm and dry. No pallor.  No jaundice  Psychiatric: She has a normal mood and affect. Her behavior is normal. Judgment and thought content normal.  Vitals reviewed.        Assessment & Plan:   Problem List Items Addressed This Visit      Respiratory   Acute asthmatic bronchitis    Respiratory symptoms for the last 3-4 weeks, most likely related to asthma. Not much evidence of pneumonia, sinusitis or more serious issues such as a PE. Plan: Symbicort, continue albuterol, see PCP in 2 or 3 weeks. See instructions      Relevant Medications   SYMBICORT  inhaler 160-4.5 mcg/puff 2 puff bid, #1, 3 RF   albuterol (PROVENTIL HFA;VENTOLIN HFA) 108 (90 BASE) MCG/ACT inhaler    Other Visit Diagnoses    Cough    -  Primary    Relevant Medications    albuterol (PROVENTIL HFA;VENTOLIN HFA) 108 (90 BASE) MCG/ACT inhaler

## 2014-08-15 NOTE — Assessment & Plan Note (Signed)
Respiratory symptoms for the last 3-4 weeks, most likely related to asthma. Not much evidence of pneumonia, sinusitis or more serious issues such as a PE. Plan: Symbicort, continue albuterol, see PCP in 2 or 3 weeks. See instructions

## 2014-08-22 ENCOUNTER — Telehealth: Payer: Self-pay

## 2014-08-22 NOTE — Telephone Encounter (Signed)
LMOM informing Pt to return call.  

## 2014-08-22 NOTE — Telephone Encounter (Signed)
-----   Message from Colon Branch, MD sent at 08/15/2014  7:24 PM EST ----- Regarding: Check on the patient Was seen last week with asthma, please open a phone note, call the patient, see if she is better---- let me know

## 2014-08-23 NOTE — Telephone Encounter (Signed)
Tried calling Pt yesterday: 08/22/2014, to check on her asthma symptoms, unable to get a hold of Pt.

## 2014-08-23 NOTE — Telephone Encounter (Signed)
Ok, thx

## 2014-08-30 ENCOUNTER — Encounter: Payer: Self-pay | Admitting: Family Medicine

## 2014-08-30 ENCOUNTER — Ambulatory Visit (INDEPENDENT_AMBULATORY_CARE_PROVIDER_SITE_OTHER): Payer: Managed Care, Other (non HMO) | Admitting: Family Medicine

## 2014-08-30 VITALS — BP 122/80 | HR 78 | Temp 98.1°F | Resp 16 | Wt 125.5 lb

## 2014-08-30 DIAGNOSIS — J4521 Mild intermittent asthma with (acute) exacerbation: Secondary | ICD-10-CM

## 2014-08-30 NOTE — Progress Notes (Signed)
   Subjective:    Patient ID: Kelsey [name], female    DOB: 1976/05/19, 39 y.o.   MRN: [id number]  HPI Asthmatic bronchitis- pt was seen on 2/8 by Dr Larose Kells.  Pt feeling much better than previous.  Breathing is 'a lot better'.  No longer wheezing.  Only using albuterol as needed.  Pt stopped using Symbicort after 1 week when she felt better.   Review of Systems For ROS see HPI     Objective:   Physical Exam  Constitutional: She is oriented to person, place, and time. She appears well-developed and well-nourished. No distress.  HENT:  Head: Normocephalic and atraumatic.  Cardiovascular: Normal rate, regular rhythm and normal heart sounds.   Pulmonary/Chest: Breath sounds normal. No respiratory distress. She has no wheezes. She has no rales.  Neurological: She is alert and oriented to person, place, and time.  Skin: Skin is warm and dry.  Psychiatric: She has a normal mood and affect. Her behavior is normal. Thought content normal.  Vitals reviewed.         Assessment & Plan:

## 2014-08-30 NOTE — Progress Notes (Signed)
Pre visit review using our clinic review tool, if applicable. No additional management support is needed unless otherwise documented below in the visit note. 

## 2014-08-30 NOTE — Assessment & Plan Note (Signed)
Resolved.  Pt doing much better and rarely requiring albuterol at this time.  Has stopped Symbicort.  Pt instructed to hold onto in case of future need.  Pt expressed understanding and is in agreement w/ plan.

## 2014-08-30 NOTE — Patient Instructions (Signed)
Follow up as needed Continue the albuterol as needed No need for Symbicort at this time Call with any questions or concerns [place]!!!

## 2014-11-02 ENCOUNTER — Ambulatory Visit (INDEPENDENT_AMBULATORY_CARE_PROVIDER_SITE_OTHER): Payer: Managed Care, Other (non HMO) | Admitting: Medical

## 2014-11-02 ENCOUNTER — Encounter: Payer: Self-pay | Admitting: Medical

## 2014-11-02 ENCOUNTER — Telehealth: Payer: Self-pay | Admitting: Family Medicine

## 2014-11-02 VITALS — BP 127/71 | HR 63 | Temp 98.7°F | Ht 63.0 in | Wt 123.4 lb

## 2014-11-02 DIAGNOSIS — R109 Unspecified abdominal pain: Secondary | ICD-10-CM

## 2014-11-02 DIAGNOSIS — N390 Urinary tract infection, site not specified: Secondary | ICD-10-CM | POA: Diagnosis not present

## 2014-11-02 LAB — POCT URINALYSIS DIPSTICK
Bilirubin, UA: 1
Blood, UA: NEGATIVE
Glucose, UA: NEGATIVE
Ketones, UA: NEGATIVE
Nitrite, UA: NEGATIVE
Protein, UA: NEGATIVE
Spec Grav, UA: 1.01
Urobilinogen, UA: 0.2
pH, UA: 7.5

## 2014-11-02 LAB — POCT URINE PREGNANCY: Preg Test, Ur: NEGATIVE

## 2014-11-02 MED ORDER — NITROFURANTOIN MONOHYD MACRO 100 MG PO CAPS: 100.0000 mg | ORAL_CAPSULE | Freq: Two times a day (BID) | ORAL | Status: DC

## 2014-11-02 MED ORDER — ONDANSETRON 8 MG PO TBDP: 8.0000 mg | ORAL_TABLET | Freq: Three times a day (TID) | ORAL | Status: DC | PRN

## 2014-11-02 NOTE — Telephone Encounter (Signed)
Per provider. Pt would need an appt, since she has already been treated twice.

## 2014-11-02 NOTE — Patient Instructions (Addendum)
UTI (lower urinary tract infection) With rt cva are pain. With your history of uti and pyelonephritis, I do think best to treat you for uti and get culture.  Rx macrobid antibiotic and zofran for nausea and vomiting.   Pain in the abdomen Some lower and suprapubic. Since urine culture is pending and some nausea with loose stools this weekend decided to get labs. Cbc, cmp and lipase.    If symptoms worsen or change notify us.  Follow up 7 days or as needed.  Regarding faint ha recently past week and her recent lack of concentration will follow. Note neurologic exam negative. Asked her to notify us if these symptoms change or worsened. Did not think in depth work up such as imaging studies needed based on her complaint description. She primarily expressed urinary complaints/concerns.  Pt appears alert and oriented.

## 2014-11-02 NOTE — Progress Notes (Signed)
Subjective:    Patient ID: Kelsey [name], female    DOB: 25-Sep-1975, 39 y.o.   MRN: [id number]  HPI  Pt in with some  Rt cva area back pain, nauseau, and had since saturday.(then states low level ha for one week). Pt had 2 uti in past 2 months. Pt had 1 event of pyelonephritis in 2008. She had kidney stones at that time. Pt has over weekend some upset stomach lower portion. Most of her GI symptoms improved. Pt had some loose stools this weekend.    Past last 2  uti were treated by [facility].   LMP- mirena. Pregnancy last week negative.  Some mild lack of concentration. Recently. Also faint mild ha that came on with above signs and symptoms. But on review no associated neurologic signs or symptoms.       Review of Systems  Constitutional: Negative for fever, chills, diaphoresis, activity change and fatigue.  Respiratory: Negative for cough, chest tightness and shortness of breath.   Cardiovascular: Negative for chest pain, palpitations and leg swelling.  Gastrointestinal: Positive for nausea. Negative for vomiting, abdominal pain and diarrhea.       Loose stools this weekendand mild upset stomach.  Genitourinary: Positive for dysuria. Negative for urgency, frequency, flank pain, difficulty urinating and vaginal pain.       Faint suprapubic pressure.  Musculoskeletal: Positive for back pain. Negative for neck pain and neck stiffness.  Neurological: Negative for dizziness, tremors, seizures, syncope, facial asymmetry, speech difficulty, weakness, light-headedness, numbness and headaches.       Faint ha this weekend.  Psychiatric/Behavioral: Negative for behavioral problems, confusion and agitation. The patient is not nervous/anxious.      Past Medical History  Diagnosis Date  . Abnormal Pap smear   . Anemia   . Chronic sinusitis   . Pyelonephritis 2008    Pylo and stones in the past  . Sinus problem     sinusitis  . Headache(784.0)     History   Social  History  . Marital Status: Married    Spouse Name: [name]  . Number of Children: 2  . Years of Education: [name]   Occupational History  . clinical Education officer, museum    Social History Main Topics  . Smoking status: Never Smoker   . Smokeless tobacco: Never Used  . Alcohol Use: No  . Drug Use: No  . Sexual Activity: Yes   Other Topics Concern  . Not on file   Social History Narrative   1 daughter--[name] born 12/09, 1 step son    Past Surgical History  Procedure Laterality Date  . Cesarean section    . Knee arthroscopy      right knee  . Colposcopy    . Wisdom tooth extraction    . Cesarean section  08/31/2011    Procedure: CESAREAN SECTION;  Surgeon: [name], MD;  Location: [facility];  Service: Gynecology;  Laterality: [name];  Repeat cesarean section with delivery of baby     Family History  Problem Relation Age of Onset  . Heart disease Brother     sudden cardiac death  . Asthma Brother   . Heart disease Maternal Grandfather 50    MI  . Coronary artery disease Maternal Grandfather   . Diabetes Maternal Grandfather   . Kidney disease Maternal Grandfather   . Cancer Neg Hx   . Stroke Neg Hx   . Hypertension Neg Hx     Allergies  Allergen  Reactions  . Morphine And Related Hives  . Sulfa Antibiotics Nausea And Vomiting    Current Outpatient Prescriptions on File Prior to Visit  Medication Sig Dispense Refill  . albuterol (PROVENTIL HFA;VENTOLIN HFA) 108 (90 BASE) MCG/ACT inhaler Inhale 2 puffs into the lungs every 4 (four) hours as needed for wheezing. 1 Inhaler 2  . Ascorbic Acid (VITAMIN C) 500 MG CAPS Take 1 tablet by mouth daily.     . Betamethasone Valerate 0.12 % foam Apply 1 application topically daily. 100 g 6  . budesonide-formoterol (SYMBICORT) 160-4.5 MCG/ACT inhaler Inhale 2 puffs into the lungs 2 (two) times daily. 1 Inhaler 1  . Calcium Carbonate-Vitamin D (CALCIUM 600/VITAMIN D) 600-400 MG-UNIT per tablet Take 1 tablet by mouth daily.    .  clobetasol ointment (TEMOVATE) 0.05 %     . fish oil-omega-3 fatty acids 1000 MG capsule Take 1 g by mouth daily.     Marland Kitchen FLUoxetine (PROZAC) 20 MG tablet Take 20 mg by mouth daily.    Marland Kitchen FOLIC ACID PO Take 622 mcg by mouth daily.     . IRON PO Take 65 mg by mouth daily.     [name] (LAMICTAL) 25 MG tablet Take 100 mg by mouth daily.     [name] (MIRENA) 20 MCG/24HR IUD 1 each by Intrauterine route once.    . meloxicam (MOBIC) 15 MG tablet Take 1 tablet (15 mg total) by mouth daily. 30 tablet 1  . omeprazole (PRILOSEC) 20 MG capsule Take 1 capsule (20 mg total) by mouth daily. 30 capsule 3  . OVER THE COUNTER MEDICATION Take 1 tablet by mouth daily. Over the counter, POTASSIUM 550mg  supplement    . topiramate (TOPAMAX) 100 MG tablet Take 100 mg by mouth 2 (two) times daily.    . urea (CARMOL) 40 % CREA Apply topically bid. 198.6 each 3  . valACYclovir (VALTREX) 500 MG tablet Take 500 mg by mouth daily.     No current facility-administered medications on file prior to visit.    BP 127/71 mmHg  Pulse 63  Temp(Src) 98.7 F (37.1 C) (Oral)  Ht 5\' 3"  (1.6 m)  Wt 123 lb 6.4 oz (55.974 kg)  BMI 21.86 kg/m2  SpO2 99%       Objective:   Physical Exam  General Mental Status- Alert. General Appearance- Not in acute distress.   Skin General: Color- Normal Color. Moisture- Normal Moisture.  Neck Carotid Arteries- Normal color. Moisture- Normal Moisture. No carotid bruits. No JVD.  Chest and Lung Exam Auscultation: Breath Sounds:-Normal.  Cardiovascular Auscultation:Rythm- Regular. Murmurs & Other Heart Sounds:Auscultation of the heart reveals- No Murmurs.    Neurologic Cranial Nerve exam:- CN III-XII intact(No nystagmus), symmetric smile. Drift Test:- No drift. Romberg Exam:- Negative.  Heal to Toe Gait exam:-Normal.  General Appearance- Not in acute distress.  HEENT Eyes- Scleraeral/Conjuntiva-bilat- Not Yellow. Mouth & Throat- Normal.  Chest and Lung  Exam Auscultation: Breath sounds:-Normal. Adventitious sounds:- No Adventitious sounds.  Cardiovascular Auscultation:Rythm - Regular. Heart Sounds -Normal heart sounds.  Abdomen Inspection:-Inspection Normal.  Palpation/Perucssion: Palpation and Percussion of the abdomen reveal- faint  Tender subprapubic area(no heal jar pain + no rlq pain), No Rebound tenderness, No rigidity(Guarding) and No Palpable abdominal masses.  Liver:-Normal.  Spleen:- Normal.   Back- mild rt cva tenderness.  .      Assessment & Plan:

## 2014-11-02 NOTE — Telephone Encounter (Signed)
Left detailed message informing patient of this and to call and schedule appointment

## 2014-11-02 NOTE — Assessment & Plan Note (Signed)
With rt cva are pain. With your history of uti and pyelonephritis, I do think best to treat you for uti and get culture.  Rx macrobid antibiotic and zofran for nausea and vomiting.

## 2014-11-02 NOTE — Telephone Encounter (Signed)
Caller name: Terry Relation to pt: self Call back number: [phone] Pharmacy:  Reason for call:   Patient thinks that she either has a kidney/bladder infection and wants to know if she can give a urine sample. Has been treated twice for uti at her gyn. Back pain.

## 2014-11-02 NOTE — Assessment & Plan Note (Signed)
Some lower and suprapubic. Since urine culture is pending and some nausea with loose stools this weekend decided to get labs. Cbc, cmp and lipase.

## 2014-11-02 NOTE — Progress Notes (Signed)
Pre visit review using our clinic review tool, if applicable. No additional management support is needed unless otherwise documented below in the visit note. 

## 2014-11-02 NOTE — Telephone Encounter (Signed)
Appointment scheduled for 11/02/14 with Mackie Pai, PA-C

## 2014-11-03 LAB — COMPREHENSIVE METABOLIC PANEL
ALT: 13 U/L (ref 0–35)
AST: 13 U/L (ref 0–37)
Albumin: 4.4 g/dL (ref 3.5–5.2)
Alkaline Phosphatase: 64 U/L (ref 39–117)
BUN: 11 mg/dL (ref 6–23)
CO2: 25 mEq/L (ref 19–32)
Calcium: 8.9 mg/dL (ref 8.4–10.5)
Chloride: 106 mEq/L (ref 96–112)
Creatinine, Ser: 0.92 mg/dL (ref 0.40–1.20)
GFR: 72.77 mL/min (ref >60.00)
Glucose, Bld: 83 mg/dL (ref 70–99)
Potassium: 3.7 mEq/L (ref 3.5–5.1)
Sodium: 138 mEq/L (ref 135–145)
Total Bilirubin: 0.4 mg/dL (ref 0.2–1.2)
Total Protein: 6.8 g/dL (ref 6.0–8.3)

## 2014-11-03 LAB — CBC WITH DIFFERENTIAL/PLATELET
Basophils Absolute: 0 10*3/uL (ref 0.0–0.1)
Basophils Relative: 0.3 % (ref 0.0–3.0)
Eosinophils Absolute: 0.1 10*3/uL (ref 0.0–0.7)
Eosinophils Relative: 1.2 % (ref 0.0–5.0)
HCT: 40.9 % (ref 36.0–46.0)
Hemoglobin: 13.9 g/dL (ref 12.0–15.0)
Lymphocytes Relative: 29.6 % (ref 12.0–46.0)
Lymphs Abs: 1.6 10*3/uL (ref 0.7–4.0)
MCHC: 34.1 g/dL (ref 30.0–36.0)
MCV: 90.5 fl (ref 78.0–100.0)
Monocytes Absolute: 0.5 10*3/uL (ref 0.1–1.0)
Monocytes Relative: 9.3 % (ref 3.0–12.0)
Neutro Abs: 3.3 10*3/uL (ref 1.4–7.7)
Neutrophils Relative %: 59.6 % (ref 43.0–77.0)
Platelets: 223 10*3/uL (ref 150.0–400.0)
RBC: 4.52 Mil/uL (ref 3.87–5.11)
RDW: 13.2 % (ref 11.5–15.5)
WBC: 5.5 10*3/uL (ref 4.0–10.5)

## 2014-11-03 LAB — LIPASE: Lipase: 47 U/L (ref 11.0–59.0)

## 2014-11-04 ENCOUNTER — Telehealth: Payer: Self-pay | Admitting: Medical

## 2014-11-04 ENCOUNTER — Encounter: Payer: Self-pay | Admitting: Medical

## 2014-11-04 LAB — URINE CULTURE
Colony Count: NO GROWTH
Organism ID, Bacteria: NO GROWTH

## 2014-11-04 NOTE — Telephone Encounter (Signed)
Discussed with pt her persisting back pain and some flank pain now. Pain despite ibuprofen. Labs and urine culture neg. No blood on ua dip. I advised pt UC now or over weekend. She may try to make it until Monday and be seen here again. Explained advantage of UC this weekend.(they could repeat ua and get kub)  I advised pt during interim can take alleve otc 2 tab po bid. And stop ipubrofen. This may provide better pain relief.

## 2014-11-07 ENCOUNTER — Encounter: Payer: Self-pay | Admitting: Family

## 2014-11-07 ENCOUNTER — Ambulatory Visit (INDEPENDENT_AMBULATORY_CARE_PROVIDER_SITE_OTHER): Payer: Managed Care, Other (non HMO) | Admitting: Family

## 2014-11-07 VITALS — BP 106/64 | HR 81 | Temp 99.0°F | Resp 16 | Ht 63.0 in | Wt 123.8 lb

## 2014-11-07 DIAGNOSIS — N390 Urinary tract infection, site not specified: Secondary | ICD-10-CM | POA: Diagnosis not present

## 2014-11-07 DIAGNOSIS — F3181 Bipolar II disorder: Secondary | ICD-10-CM

## 2014-11-07 DIAGNOSIS — Z77120 Contact with and (suspected) exposure to mold (toxic): Secondary | ICD-10-CM | POA: Diagnosis not present

## 2014-11-07 LAB — POCT URINALYSIS DIPSTICK
Bilirubin, UA: NEGATIVE
Blood, UA: NEGATIVE
Glucose, UA: NEGATIVE
Ketones, UA: NEGATIVE
Leukocytes, UA: NEGATIVE
Nitrite, UA: NEGATIVE
Protein, UA: NEGATIVE
Spec Grav, UA: 1.01
Urobilinogen, UA: 0.2
pH, UA: 7

## 2014-11-07 NOTE — Progress Notes (Signed)
Pre visit review using our clinic review tool, if applicable. No additional management support is needed unless otherwise documented below in the visit note. 
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
## 2014-11-07 NOTE — Assessment & Plan Note (Signed)
I do think that her tearfulness, concentration issues are related to depression and recent high level stress. She is working with a therapist which I have encouraged her to continue.  I have also recommended that she keep her upcoming appointment with Dr. Clovis Pu.

## 2014-11-07 NOTE — Addendum Note (Signed)
Addended by: Kelle Darting A on: 11/07/2014 05:44 PM   Modules accepted: Orders

## 2014-11-07 NOTE — Assessment & Plan Note (Addendum)
Could account for HA and recent worsening of her asthma symptoms. I have advised her to add claritin, (avoid spending time at the home with known mold) and resume symbicort. Continue albuterol as needed.  Advised her to resume prilosec to see if this helps with nausea.  Follow up in 1 month, sooner if symptoms worsen or do not improve.

## 2014-11-07 NOTE — Patient Instructions (Addendum)
Restart prilosec and symbicort. Add claritin 10mg  once daily. Follow up with Dr. Clovis Pu as scheduled. Follow up with Dr. Birdie Riddle in 1 month, sooner if problems/concerns.

## 2014-12-12 ENCOUNTER — Ambulatory Visit (INDEPENDENT_AMBULATORY_CARE_PROVIDER_SITE_OTHER): Payer: Managed Care, Other (non HMO) | Admitting: Family Medicine

## 2014-12-12 ENCOUNTER — Encounter: Payer: Self-pay | Admitting: Family Medicine

## 2014-12-12 VITALS — BP 112/68 | HR 88 | Temp 98.1°F | Resp 16 | Wt 128.2 lb

## 2014-12-12 DIAGNOSIS — J4530 Mild persistent asthma, uncomplicated: Secondary | ICD-10-CM | POA: Diagnosis not present

## 2014-12-12 DIAGNOSIS — E559 Vitamin D deficiency, unspecified: Secondary | ICD-10-CM | POA: Diagnosis not present

## 2014-12-12 DIAGNOSIS — J45909 Unspecified asthma, uncomplicated: Secondary | ICD-10-CM | POA: Insufficient documentation

## 2014-12-12 DIAGNOSIS — R110 Nausea: Secondary | ICD-10-CM

## 2014-12-12 MED ORDER — OMEPRAZOLE 20 MG PO CPDR: 20.0000 mg | DELAYED_RELEASE_CAPSULE | Freq: Every day | ORAL | Status: DC

## 2014-12-12 NOTE — Patient Instructions (Signed)
Follow up as needed Make sure you are taking the Symbicort twice daily Continue Claritin or Zyrtec daily for the allergy component Restart the Omeprazole for the increased acid production- this will improve the nausea Call with any questions or concerns Have a great summer!

## 2014-12-12 NOTE — Progress Notes (Signed)
   Subjective:    Patient ID: Kelsey [name], female    DOB: 10-26-1975, 39 y.o.   MRN: [id number]  HPI Asthma- not using symbicort 'as i should have'.  Still having some SOB.  Will wake in the middle of night w/ coughing spells.  Occasionally using Albuterol.  Taking OTC antihistamine 'when I remember'.  Vit D deficiency- pt's level was found to be 37.  Pt is now taking 5,000 units twice daily.  Reports since she started this she has noted improved concentration, improved mood.  Nausea- occuring in the AM.  Psych thinks this could be Topamax related.  Not currently on anything for GERD.  Wants new script for Omeprazole as this has been a problem in the past.  Review of Systems For ROS see HPI     Objective:   Physical Exam  Constitutional: She is oriented to person, place, and time. She appears well-developed and well-nourished. No distress.  HENT:  Head: Normocephalic and atraumatic.  Right Ear: Tympanic membrane normal.  Left Ear: Tympanic membrane normal.  Nose: Mucosal edema and rhinorrhea present. Right sinus exhibits no maxillary sinus tenderness and no frontal sinus tenderness. Left sinus exhibits no maxillary sinus tenderness and no frontal sinus tenderness.  Mouth/Throat: Mucous membranes are normal. Posterior oropharyngeal erythema (w/ PND) present.  Eyes: Conjunctivae and EOM are normal. Pupils are equal, round, and reactive to light.  Neck: Normal range of motion. Neck supple.  Cardiovascular: Normal rate, regular rhythm and normal heart sounds.   Pulmonary/Chest: Effort normal and breath sounds normal. No respiratory distress. She has no wheezes. She has no rales.  Abdominal: Soft. Bowel sounds are normal. She exhibits no distension. There is no tenderness. There is no rebound and no guarding.  Musculoskeletal: She exhibits no edema.  Lymphadenopathy:    She has no cervical adenopathy.  Neurological: She is alert and oriented to person, place, and time.  Skin:  Skin is warm and dry.  Psychiatric: She has a normal mood and affect. Her behavior is normal. Thought content normal.  Vitals reviewed.         Assessment & Plan:

## 2014-12-12 NOTE — Assessment & Plan Note (Signed)
New.  Suspect this is due to untreated acid reflux.  Restart PPI.  Reviewed dietary and lifestyle modifications.  Will follow.

## 2014-12-12 NOTE — Progress Notes (Signed)
Pre visit review using our clinic review tool, if applicable. No additional management support is needed unless otherwise documented below in the visit note. 

## 2014-12-12 NOTE — Assessment & Plan Note (Signed)
New.  Pt is repleting w/ 5000 units BID.  Reports sxs are improving.  Will continue to follow.

## 2014-12-12 NOTE — Assessment & Plan Note (Signed)
New to provider.  Pt reports improvement in sxs w/ Symbicort.  Admits to not using as regularly as she should and having breakthrough sxs.  Pt encouraged to use Symbicort regularly.  Add OTC antihistamine.  Reviewed supportive care and red flags that should prompt return.  Pt expressed understanding and is in agreement w/ plan.

## 2015-08-29 ENCOUNTER — Ambulatory Visit (INDEPENDENT_AMBULATORY_CARE_PROVIDER_SITE_OTHER): Payer: BLUE CROSS/BLUE SHIELD | Admitting: Internal Medicine

## 2015-08-29 ENCOUNTER — Encounter: Payer: Self-pay | Admitting: Internal Medicine

## 2015-08-29 VITALS — BP 110/76 | HR 83 | Temp 98.4°F | Ht 63.0 in | Wt 130.3 lb

## 2015-08-29 DIAGNOSIS — J4521 Mild intermittent asthma with (acute) exacerbation: Secondary | ICD-10-CM | POA: Diagnosis not present

## 2015-08-29 DIAGNOSIS — R059 Cough, unspecified: Secondary | ICD-10-CM

## 2015-08-29 DIAGNOSIS — R05 Cough: Secondary | ICD-10-CM

## 2015-08-29 MED ORDER — FLUCONAZOLE 150 MG PO TABS: 150.0000 mg | ORAL_TABLET | Freq: Every day | ORAL | Status: DC

## 2015-08-29 MED ORDER — BUDESONIDE-FORMOTEROL FUMARATE 160-4.5 MCG/ACT IN AERO: 2.0000 | INHALATION_SPRAY | Freq: Two times a day (BID) | RESPIRATORY_TRACT | Status: DC

## 2015-08-29 MED ORDER — AZITHROMYCIN 250 MG PO TABS: ORAL_TABLET | ORAL | Status: DC

## 2015-08-29 MED ORDER — PREDNISONE 10 MG PO TABS: ORAL_TABLET | ORAL | Status: DC

## 2015-08-29 MED ORDER — ALBUTEROL SULFATE HFA 108 (90 BASE) MCG/ACT IN AERS: 2.0000 | INHALATION_SPRAY | RESPIRATORY_TRACT | Status: DC | PRN

## 2015-08-29 NOTE — Progress Notes (Signed)
Subjective:    Patient ID: Kelsey [name], female    DOB: 1976-04-02, 40 y.o.   MRN: [id number]  DOS:  08/29/2015 Type of visit - description : Acute visit Interval history: Symptoms started 5 days ago with nose congestion, then sx  "moved to the chest", she developed cough, green sputum production and increased wheezing. She is taking Symbicort and albuterol frequently without much help. The cough prevent her from sleep. Today is feeling dizzy, slightly nauseous, having a mild headache, ears hurting. She feels really tired. Taking also OTCs for cough with little relief.  Also reported that in general for the last 3 months her asthma has not been well controlled, using albuterol 6 or 7 times a week.  Review of Systems  No fevers, + chills. No vomiting, had diarrhea couple of times. No blood in the stools. She feels very tired but not near syncopal.  Past Medical History  Diagnosis Date  . Abnormal Pap smear   . Anemia   . Chronic sinusitis   . Pyelonephritis 2008    Pylo and stones in the past  . Sinus problem     sinusitis  . [id number]     Past Surgical History  Procedure Laterality Date  . Cesarean section    . Knee arthroscopy      right knee  . Colposcopy    . Wisdom tooth extraction    . Cesarean section  08/31/2011    Procedure: CESAREAN SECTION;  Surgeon: [name], MD;  Location: [facility];  Service: Gynecology;  Laterality: [name];  Repeat cesarean section with delivery of baby     Social History   Social History  . Marital Status: Married    Spouse Name: [name]  . Number of Children: 2  . Years of Education: [name]   Occupational History  . clinical Education officer, [employer]    Social History Main Topics  . Smoking status: Never Smoker   . Smokeless tobacco: Never Used  . Alcohol Use: No  . Drug Use: No  . Sexual Activity: Yes   Other Topics Concern  . Not on file   Social History Narrative   1 daughter--[name] born 12/09, 1 step son        Medication List       This list is accurate as of: 08/29/15 11:59 PM.  Always use your most recent med list.               albuterol 108 (90 Base) MCG/ACT inhaler  Commonly known as:  PROVENTIL HFA;VENTOLIN HFA  Inhale 2 puffs into the lungs every 4 (four) hours as needed for wheezing.     azithromycin 250 MG tablet  Commonly known as:  ZITHROMAX Z-PAK  2 tabs a day the first day, then 1 tab a day x 4 days     Betamethasone Valerate 0.12 % foam  Apply 1 application topically daily.     budesonide-formoterol 160-4.5 MCG/ACT inhaler  Commonly known as:  SYMBICORT  Inhale 2 puffs into the lungs 2 (two) times daily.     CALCIUM 600/VITAMIN D 600-400 MG-UNIT tablet  Generic drug:  Calcium Carbonate-Vitamin D  Take 1 tablet by mouth daily.     clobetasol ointment 0.05 %  Commonly known as:  TEMOVATE     fish oil-omega-3 fatty acids 1000 MG capsule  Take 1 g by mouth daily.     fluconazole 150 MG tablet  Commonly known as:  DIFLUCAN  Take 1 tablet (  150 mg total) by mouth daily.     FOLIC ACID PO  Take [id number] mcg by mouth daily. Reported on 08/29/2015     IRON PO  Take 65 mg by mouth daily.     lamoTRIgine 25 MG tablet  Commonly known as:  LAMICTAL  Take 100 mg by mouth daily.     levonorgestrel 20 MCG/24HR IUD  Commonly known as:  MIRENA  1 each by Intrauterine route once.     omeprazole 20 MG capsule  Commonly known as:  PRILOSEC  Take 1 capsule (20 mg total) by mouth daily.     ondansetron 8 MG disintegrating tablet  Commonly known as:  ZOFRAN ODT  Take 1 tablet (8 mg total) by mouth every 8 (eight) hours as needed for nausea or vomiting.     OVER THE COUNTER MEDICATION  Take 1 tablet by mouth daily. Over the counter, POTASSIUM 550mg  supplement     predniSONE 10 MG tablet  Commonly known as:  DELTASONE  3 tabs x 3 days, 2 tabs x 3 days, 1 tab x 3 days     topiramate 100 MG tablet  Commonly known as:  TOPAMAX  Take 100 mg by mouth 2 (two) times daily.      urea 40 % Crea  Commonly known as:  CARMOL  Apply topically bid.     valACYclovir 500 MG tablet  Commonly known as:  VALTREX  Take 500 mg by mouth daily. Reported on 08/29/2015     Vitamin C 500 MG Caps  Take 1 tablet by mouth daily.     VITAMIN D PO  Take 5,000 mg by mouth 2 (two) times daily.           Objective:   Physical Exam BP 110/76 mmHg  Pulse 83  Temp(Src) 98.4 F (36.9 C) (Oral)  Ht 5\' 3"  (1.6 m)  Wt 130 lb 4.8 oz (59.104 kg)  BMI 23.09 kg/m2  SpO2 97% General:   Well developed, well nourished, nontoxic appearing  HEENT:  Normocephalic . Face symmetric, atraumatic. TMs normal. Nose congested, sinuses not TTP. Throat symmetric, no red Lungs:  Slightly decreased breath sounds at bases, slightly increased expiratory time but no crackles or actual wheezing. Normal respiratory effort, no intercostal retractions, no accessory muscle use. Heart: RRR,  no murmur.  No pretibial edema bilaterally  Skin: Not pale. Not jaundice Neurologic:  alert & oriented X3.  Speech normal, gait appropriate for age and unassisted Psych--  Cognition and judgment appear intact.  Cooperative with normal attention span and concentration.  Behavior appropriate. No anxious or depressed appearing.      Assessment & Plan:   Asthma exacerbation: Asthma exacerbation, likely from bronchitis, patient concerned about pneumonia,  a chest x-ray was discussed  but she elected to hold off. Will treat with Zithromax, prednisone. Symbicort and albuterol. Requested Diflucan in case she develops a yeast infection. Rx sent. If she's not better she will let me know. Also, asthma is not well controlled chronically. Recommend to see PCP in 3 weeks.

## 2015-08-29 NOTE — Patient Instructions (Signed)
Please get a chest x-ray at the first floor.   ------ Rest, fluids , tylenol  For cough:  Take Mucinex DM twice a day as needed until better  For nasal congestion: Use OTC Nasocort or Flonase : 2 nasal sprays on each side of the nose in the morning until you feel better   Avoid decongestants such as  Pseudoephedrine or phenylephrine    Take the antibiotic as prescribed  (zithromax) Starting tomorrow, take prednisone in the mornings   Call if not gradually better over the next  10 days  Call anytime if the symptoms are severe   ---  Please see your primary doctor in 3 weeks '

## 2015-09-14 ENCOUNTER — Encounter: Payer: Self-pay | Admitting: Family Medicine

## 2015-09-14 ENCOUNTER — Ambulatory Visit (INDEPENDENT_AMBULATORY_CARE_PROVIDER_SITE_OTHER): Payer: BLUE CROSS/BLUE SHIELD | Admitting: Family Medicine

## 2015-09-14 VITALS — BP 109/84 | HR 82 | Temp 98.3°F | Resp 16 | Ht 63.0 in | Wt 128.1 lb

## 2015-09-14 DIAGNOSIS — J4540 Moderate persistent asthma, uncomplicated: Secondary | ICD-10-CM | POA: Diagnosis not present

## 2015-09-14 DIAGNOSIS — J0110 Acute frontal sinusitis, unspecified: Secondary | ICD-10-CM | POA: Insufficient documentation

## 2015-09-14 MED ORDER — MONTELUKAST SODIUM 10 MG PO TABS: 10.0000 mg | ORAL_TABLET | Freq: Every day | ORAL | Status: DC

## 2015-09-14 MED ORDER — FLUCONAZOLE 150 MG PO TABS: 150.0000 mg | ORAL_TABLET | Freq: Once | ORAL | Status: DC

## 2015-09-14 MED ORDER — AMOXICILLIN 875 MG PO TABS: 875.0000 mg | ORAL_TABLET | Freq: Two times a day (BID) | ORAL | Status: DC

## 2015-09-14 NOTE — Assessment & Plan Note (Signed)
Deteriorated.  Pt is having increased difficulty w/ SOB and using inhalers more frequently.  Will start Singulair daily in addition to Claritin/Zyrtec.  Continue Symbicort twice daily and albuterol prn.  Reviewed supportive care and red flags that should prompt return.  Pt expressed understanding and is in agreement w/ plan.

## 2015-09-14 NOTE — Assessment & Plan Note (Signed)
New.  Pt's sxs and PE consistent w/ infxn.  Start abx.  Reviewed supportive care and red flags that should prompt return.  Pt expressed understanding and is in agreement w/ plan.  

## 2015-09-14 NOTE — Progress Notes (Signed)
   Subjective:    Patient ID: Kelsey [name], female    DOB: 04-Jun-1976, 40 y.o.   MRN: [id number]  HPI URI- pt was seen 2/21 and tx'd w/ Zpack and prednisone which improved sxs for a few days but sxs returned on Sunday.  + nasal congestion, chest congestion, SOB, coughing- productive.  Bilateral ear pain, sinus pain/pressure.  + HA.  No tooth pain.  Increased inhaler use over last 3 months but particularly in the last week.  On Symbicort and Albuterol.   Review of Systems For ROS see HPI     Objective:   Physical Exam  Constitutional: She is oriented to person, place, and time. She appears well-developed and well-nourished. No distress.  HENT:  Head: Normocephalic and atraumatic.  Right Ear: Tympanic membrane normal.  Left Ear: Tympanic membrane normal.  Nose: Mucosal edema and rhinorrhea present. Right sinus exhibits maxillary sinus tenderness and frontal sinus tenderness. Left sinus exhibits maxillary sinus tenderness and frontal sinus tenderness.  Mouth/Throat: Uvula is midline and mucous membranes are normal. Posterior oropharyngeal erythema present. No oropharyngeal exudate.  Eyes: Conjunctivae and EOM are normal. Pupils are equal, round, and reactive to light.  Neck: Normal range of motion. Neck supple.  Cardiovascular: Normal rate, regular rhythm and normal heart sounds.   Pulmonary/Chest: Effort normal and breath sounds normal. No respiratory distress. She has no wheezes.  Lymphadenopathy:    She has no cervical adenopathy.  Neurological: She is alert and oriented to person, place, and time.  Skin: Skin is warm and dry.  Psychiatric: She has a normal mood and affect. Her behavior is normal. Thought content normal.  Vitals reviewed.         Assessment & Plan:

## 2015-09-14 NOTE — Patient Instructions (Signed)
Cancel your appt for next week Start the Amoxicillin twice daily- take w/ food Drink plenty of fluids Start daily Claritin or Zyrtec Add Singulair daily Continue the Symbicort- 2 puffs twice daily Albuterol as needed Take the Diflucan as needed Call with any questions or concerns If you want to join [place] at the new [place] office, any scheduled appointments will automatically transfer and we will see you at [address] [place] [address] [place], [place], [place] [address] (OPENING 2 WEEKS!) [place] in there!!

## 2015-09-14 NOTE — Progress Notes (Signed)
Pre visit review using our clinic review tool, if applicable. No additional management support is needed unless otherwise documented below in the visit note. 

## 2015-09-18 ENCOUNTER — Encounter: Payer: Self-pay | Admitting: Family Medicine

## 2015-09-18 ENCOUNTER — Ambulatory Visit (INDEPENDENT_AMBULATORY_CARE_PROVIDER_SITE_OTHER): Payer: BLUE CROSS/BLUE SHIELD | Admitting: Family Medicine

## 2015-09-18 VITALS — BP 101/70 | HR 92 | Temp 98.1°F | Resp 16 | Ht 63.0 in | Wt 129.4 lb

## 2015-09-18 DIAGNOSIS — N3000 Acute cystitis without hematuria: Secondary | ICD-10-CM

## 2015-09-18 DIAGNOSIS — R350 Frequency of micturition: Secondary | ICD-10-CM

## 2015-09-18 LAB — POCT URINALYSIS DIPSTICK
Glucose, UA: NEGATIVE
Ketones, UA: NEGATIVE
Nitrite, UA: POSITIVE
Protein, UA: NEGATIVE
Spec Grav, UA: 1.02
Urobilinogen, UA: 2
pH, UA: 7

## 2015-09-18 MED ORDER — LEVOFLOXACIN 500 MG PO TABS: 500.0000 mg | ORAL_TABLET | Freq: Every day | ORAL | Status: DC

## 2015-09-18 NOTE — Progress Notes (Signed)
   Subjective:    Patient ID: Kelsey [name], female    DOB: 05-21-1976, 40 y.o.   MRN: [id number]  HPI ? UTI- sxs started yesterday w/ burning.  Worsened today.  + frequency, urgency.  Mild LBP.  No fevers.  ? singulair intolerance- pt took med on Thursday and had excessive fatigue and sedation on Friday.  Pt has been in bed all weekend.  Psych also switched meds recently- she is now taking Trintellix   Review of Systems For ROS see HPI     Objective:   Physical Exam  Constitutional: She is oriented to person, place, and time. She appears well-developed and well-nourished. No distress.  Abdominal: Soft. She exhibits no distension. There is tenderness (+ suprapubic but no CVA tenderness ).  Neurological: She is alert and oriented to person, place, and time.  Skin: Skin is warm and dry.  Psychiatric: She has a normal mood and affect. Her behavior is normal. Thought content normal.  Vitals reviewed.         Assessment & Plan:

## 2015-09-18 NOTE — Progress Notes (Signed)
Pre visit review using our clinic review tool, if applicable. No additional management support is needed unless otherwise documented below in the visit note. 

## 2015-09-18 NOTE — Patient Instructions (Signed)
Follow up as needed STOP the Amoxicillin START the Levaquin once daily- take w/ food Your body is now fighting 2 infections- sinuses and UTI- so we expect some fatigue REST! Drink plenty of fluids STOP the Singulair Call with any questions or concerns If you want to join [place] at the new [place] office, any scheduled appointments will automatically transfer and we will see you at [address] [place] [address] [place], [place] [address] (OPENING 3/23) [place] in there!!!

## 2015-09-18 NOTE — Assessment & Plan Note (Signed)
Pt's sxs and UA consistent w/ infxn despite taking Amox.  Due to this and her allergy to Sulfa, will switch to Levaquin to cover both sinusitis (from last week) and today's UTI.  Reviewed supportive care and red flags that should prompt return.  Pt expressed understanding and is in agreement w/ plan.

## 2015-09-19 ENCOUNTER — Ambulatory Visit: Payer: BLUE CROSS/BLUE SHIELD | Admitting: Family Medicine

## 2015-12-11 ENCOUNTER — Ambulatory Visit (INDEPENDENT_AMBULATORY_CARE_PROVIDER_SITE_OTHER): Payer: BLUE CROSS/BLUE SHIELD | Admitting: Family Medicine

## 2015-12-11 ENCOUNTER — Encounter: Payer: Self-pay | Admitting: Family Medicine

## 2015-12-11 ENCOUNTER — Telehealth: Payer: Self-pay | Admitting: Family Medicine

## 2015-12-11 ENCOUNTER — Other Ambulatory Visit: Payer: Self-pay | Admitting: Family Medicine

## 2015-12-11 VITALS — BP 122/76 | HR 72 | Temp 98.6°F | Wt 127.6 lb

## 2015-12-11 DIAGNOSIS — S0501XA Injury of conjunctiva and corneal abrasion without foreign body, right eye, initial encounter: Secondary | ICD-10-CM

## 2015-12-11 MED ORDER — BACITRACIN-POLYMYXIN B 500-10000 UNIT/GM OP OINT: 1.0000 "application " | TOPICAL_OINTMENT | Freq: Four times a day (QID) | OPHTHALMIC | Status: DC

## 2015-12-11 MED ORDER — MOXIFLOXACIN HCL 0.5 % OP SOLN: 1.0000 [drp] | Freq: Three times a day (TID) | OPHTHALMIC | Status: DC

## 2015-12-11 NOTE — Telephone Encounter (Signed)
Can be reached: [phone] Pharmacy: [id number] - [place], [place] - [address] AT [address]  Reason for call: pt said insurance will not cover the eye drop sent in. Please send in something else tonight if at all possible. Pt requesting call to let her know either way if sent in.

## 2015-12-11 NOTE — Progress Notes (Signed)
Patient ID: Kelsey [name], female    DOB: 1975/10/31  Age: 40 y.o. MRN: [id number]    Subjective:  Subjective HPI Kelsey [name] presents for R eye irritation after clean pipes from underneath sink-- something fell into her eye -- this occurred last night.     Review of Systems  Constitutional: Negative for diaphoresis, appetite change, fatigue and unexpected weight change.  Eyes: Negative for pain, redness and visual disturbance.  Respiratory: Negative for cough, chest tightness, shortness of breath and wheezing.   Cardiovascular: Negative for chest pain, palpitations and leg swelling.  Endocrine: Negative for cold intolerance, heat intolerance, polydipsia, polyphagia and polyuria.  Genitourinary: Negative for dysuria, frequency and difficulty urinating.  Neurological: Negative for dizziness, light-headedness, numbness and headaches.    History Past Medical History  Diagnosis Date  . Abnormal Pap smear   . Anemia   . Chronic sinusitis   . Pyelonephritis 2008    Pylo and stones in the past  . Sinus problem     sinusitis  . Headache(784.0)     She has past surgical history that includes Cesarean section; Knee arthroscopy; Colposcopy; Wisdom tooth extraction; and Cesarean section (08/31/2011).   Her family history includes Asthma in her brother; Coronary artery disease in her maternal grandfather; Diabetes in her maternal grandfather; Heart disease in her brother; Heart disease (age of onset: 81) in her maternal grandfather; Kidney disease in her maternal grandfather. There is no history of Cancer, Stroke, or Hypertension.She reports that she has never smoked. She has never used smokeless tobacco. She reports that she does not drink alcohol or use illicit drugs.  Current Outpatient Prescriptions on File Prior to Visit  Medication Sig Dispense Refill  . albuterol (PROVENTIL HFA;VENTOLIN HFA) 108 (90 Base) MCG/ACT inhaler Inhale 2 puffs into the lungs every 4 (four)  hours as needed for wheezing. 1 Inhaler 2  . Ascorbic Acid (VITAMIN C) 500 MG CAPS Take 1 tablet by mouth daily.     . Betamethasone Valerate 0.12 % foam Apply 1 application topically daily. 100 g 6  . budesonide-formoterol (SYMBICORT) 160-4.5 MCG/ACT inhaler Inhale 2 puffs into the lungs 2 (two) times daily. 1 Inhaler 1  . Calcium Carbonate-Vitamin D (CALCIUM 600/VITAMIN D) 600-400 MG-UNIT per tablet Take 1 tablet by mouth daily.    . Cholecalciferol (VITAMIN D PO) Take 5,000 mg by mouth 2 (two) times daily.    . fish oil-omega-3 fatty acids 1000 MG capsule Take 1 g by mouth daily.     Marland Kitchen FOLIC ACID PO Take [id number] mcg by mouth daily. Reported on 08/29/2015    . IRON PO Take 65 mg by mouth daily.     [name] (LAMICTAL) 25 MG tablet Take 100 mg by mouth daily.     [name] (MIRENA) 20 MCG/24HR IUD 1 each by Intrauterine route once.    . montelukast (SINGULAIR) 10 MG tablet Take 1 tablet (10 mg total) by mouth at bedtime. 30 tablet 3  . omeprazole (PRILOSEC) 20 MG capsule Take 1 capsule (20 mg total) by mouth daily. 30 capsule 6  . ondansetron (ZOFRAN ODT) 8 MG disintegrating tablet Take 1 tablet (8 mg total) by mouth every 8 (eight) hours as needed for nausea or vomiting. 12 tablet 0  . OVER THE COUNTER MEDICATION Take 1 tablet by mouth daily. Over the counter, POTASSIUM 550mg  supplement    . topiramate (TOPAMAX) 100 MG tablet Take 100 mg by mouth 2 (two) times daily.    . TRINTELLIX 10  MG TABS TK 1 T PO  D. STOP TAKING PROZAC  0  . urea (CARMOL) 40 % CREA Apply topically bid. 198.6 each 3  . valACYclovir (VALTREX) 500 MG tablet Take 500 mg by mouth daily. Reported on 09/18/2015     No current facility-administered medications on file prior to visit.     Objective:  Objective Physical Exam + Eyes: Pupils are equal, round, and reactive to light. Lids are everted and swept, no foreign bodies found. Right eye exhibits discharge. Right eye exhibits no exudate. Right conjunctiva is  injected. No scleral icterus.     BP 122/76 mmHg  Pulse 72  Temp(Src) 98.6 F (37 C) (Oral)  Wt 127 lb 9.6 oz (57.879 kg)  SpO2 98% Wt Readings from Last 3 Encounters:  12/11/15 127 lb 9.6 oz (57.879 kg)  09/18/15 129 lb 6.4 oz (58.695 kg)  09/14/15 128 lb 2 oz (58.117 kg)     Lab Results  Component Value Date   WBC 5.5 11/02/2014   HGB 13.9 11/02/2014   HCT 40.9 11/02/2014   PLT 223.0 11/02/2014   GLUCOSE 83 11/02/2014   CHOL 189 05/13/2012   TRIG 129.0 05/13/2012   HDL 68.60 05/13/2012   LDLCALC 95 05/13/2012   ALT 13 11/02/2014   AST 13 11/02/2014   NA 138 11/02/2014   K 3.7 11/02/2014   CL 106 11/02/2014   CREATININE 0.92 11/02/2014   BUN 11 11/02/2014   CO2 25 11/02/2014   TSH 0.80 05/13/2012    Dg Chest 2 View  02/05/2013  *RADIOLOGY REPORT* Clinical Data: Cough, fever CHEST - 2 VIEW Comparison: 06/09/2010 Findings: Lungs are clear. No pleural effusion or pneumothorax. Cardiomediastinal silhouette is within normal limits. Visualized osseous structures are within normal limits. IMPRESSION: No evidence of acute cardiopulmonary disease. Original Report Authenticated By: Julian Hy, M.D.     Assessment & Plan:  Plan I have discontinued Ms. [name]'s amoxicillin, fluconazole, and levofloxacin. I am also having her start on moxifloxacin. Additionally, I am having her maintain her fish oil-omega-3 fatty acids, Vitamin C, FOLIC ACID PO, IRON PO, Calcium Carbonate-Vitamin D, valACYclovir, OVER THE COUNTER MEDICATION, Betamethasone Valerate, urea, lamoTRIgine, topiramate, levonorgestrel, ondansetron, Cholecalciferol (VITAMIN D PO), omeprazole, albuterol, budesonide-formoterol, TRINTELLIX, and montelukast.  Meds ordered this encounter  Medications  . moxifloxacin (VIGAMOX) 0.5 % ophthalmic solution    Sig: Place 1 drop into the right eye 3 (three) times daily.    Dispense:  3 mL    Refill:  0    Problem List Items Addressed This Visit    None    Visit  Diagnoses    Corneal abrasion, right, initial encounter    -  Primary    Relevant Medications    moxifloxacin (VIGAMOX) 0.5 % ophthalmic solution     cool compresses Wash linens and wash cloths etc   Follow-up: Return if symptoms worsen or fail to improve.  Ann Held, DO

## 2015-12-11 NOTE — Progress Notes (Signed)
Pre visit review using our clinic review tool, if applicable. No additional management support is needed unless otherwise documented below in the visit note. 

## 2015-12-11 NOTE — Patient Instructions (Signed)

## 2015-12-11 NOTE — Telephone Encounter (Signed)
Please advise      KP 

## 2015-12-11 NOTE — Telephone Encounter (Signed)
New med sent to pharmacy and pt is aware

## 2016-02-23 ENCOUNTER — Encounter: Payer: Self-pay | Admitting: Family Medicine

## 2016-02-23 ENCOUNTER — Ambulatory Visit (INDEPENDENT_AMBULATORY_CARE_PROVIDER_SITE_OTHER): Payer: BLUE CROSS/BLUE SHIELD | Admitting: Family Medicine

## 2016-02-23 VITALS — BP 110/60 | HR 90 | Temp 99.3°F

## 2016-02-23 DIAGNOSIS — Z7251 High risk heterosexual behavior: Secondary | ICD-10-CM | POA: Diagnosis not present

## 2016-02-23 DIAGNOSIS — R5383 Other fatigue: Secondary | ICD-10-CM | POA: Diagnosis not present

## 2016-02-23 NOTE — Progress Notes (Signed)
Patient ID: Kelsey [name], female    DOB: [birth year]  Age: 40 y.o. MRN: [id number]    Subjective:  Subjective  HPI Kelsey Bekker Fieldspresents for c/o enlarged lymph nodes iin groin and axilla x 3 weeks.  Pt also wants to be checked for stds.  She has no symptoms of std--  No vaginal bleeding or d/c .   She c/o neck stiffness with enlarged lymph nodes as well.  + fatigue Review of Systems  Constitutional: Positive for fatigue. Negative for activity change, appetite change, chills, diaphoresis, fever and unexpected weight change.  HENT: Negative for congestion, ear discharge, ear pain, postnasal drip, rhinorrhea, sinus pressure and sore throat.   Respiratory: Negative for cough, shortness of breath and wheezing.   Gastrointestinal: Negative for abdominal pain, blood in stool, constipation, diarrhea and nausea.  Genitourinary: Negative for decreased urine volume, difficulty urinating, dyspareunia, dysuria, frequency, pelvic pain, urgency, vaginal bleeding, vaginal discharge and vaginal pain.  Hematological: Positive for adenopathy. Does not bruise/bleed easily.  Psychiatric/Behavioral: The patient is nervous/anxious.     History Past Medical History:  Diagnosis Date  . Abnormal Pap smear   . Anemia   . Chronic sinusitis   . Headache(784.0)   . Pyelonephritis 2008   Pylo and stones in the past  . Sinus problem    sinusitis    She has a past surgical history that includes Cesarean section; Knee arthroscopy; Colposcopy; Wisdom tooth extraction; and Cesarean section (08/31/2011).   Her family history includes Asthma in her brother; Coronary artery disease in her maternal grandfather; Diabetes in her maternal grandfather; Heart disease in her brother; Heart disease (age of onset: 56) in her maternal grandfather; Kidney disease in her maternal grandfather.She reports that she has never smoked. She has never used smokeless tobacco. She reports that she does not drink alcohol or use  drugs.  Current Outpatient Prescriptions on File Prior to Visit  Medication Sig Dispense Refill  . albuterol (PROVENTIL HFA;VENTOLIN HFA) 108 (90 Base) MCG/ACT inhaler Inhale 2 puffs into the lungs every 4 (four) hours as needed for wheezing. 1 Inhaler 2  . Ascorbic Acid (VITAMIN C) 500 MG CAPS Take 1 tablet by mouth daily.     . bacitracin-polymyxin b (POLYSPORIN) ophthalmic ointment Place 1 application into the right eye 4 (four) times daily. X 10 days 3.5 g 0  . Betamethasone Valerate 0.12 % foam Apply 1 application topically daily. 100 g 6  . budesonide-formoterol (SYMBICORT) 160-4.5 MCG/ACT inhaler Inhale 2 puffs into the lungs 2 (two) times daily. 1 Inhaler 1  . Calcium Carbonate-Vitamin D (CALCIUM 600/VITAMIN D) 600-400 MG-UNIT per tablet Take 1 tablet by mouth daily.    . Cholecalciferol (VITAMIN D PO) Take 5,000 mg by mouth 2 (two) times daily.    . fish oil-omega-3 fatty acids 1000 MG capsule Take 1 g by mouth daily.     Marland Kitchen FOLIC ACID PO Take [id number] mcg by mouth daily. Reported on 08/29/2015    . IRON PO Take 65 mg by mouth daily.     [name] (MIRENA) 20 MCG/24HR IUD 1 each by Intrauterine route once.    . montelukast (SINGULAIR) 10 MG tablet Take 1 tablet (10 mg total) by mouth at bedtime. 30 tablet 3  . moxifloxacin (VIGAMOX) 0.5 % ophthalmic solution Place 1 drop into the right eye 3 (three) times daily. 3 mL 0  . omeprazole (PRILOSEC) 20 MG capsule Take 1 capsule (20 mg total) by mouth daily. 30 capsule 6  . ondansetron (  ZOFRAN ODT) 8 MG disintegrating tablet Take 1 tablet (8 mg total) by mouth every 8 (eight) hours as needed for nausea or vomiting. 12 tablet 0  . OVER THE COUNTER MEDICATION Take 1 tablet by mouth daily. Over the counter, POTASSIUM 550mg  supplement    . topiramate (TOPAMAX) 100 MG tablet Take 100 mg by mouth 2 (two) times daily.    . TRINTELLIX 10 MG TABS TK 1 T PO  D. STOP TAKING PROZAC  0  . urea (CARMOL) 40 % CREA Apply topically bid. 198.6 each 3  .  valACYclovir (VALTREX) 500 MG tablet Take 500 mg by mouth daily. Reported on 09/18/2015     No current facility-administered medications on file prior to visit.      Objective:  Objective  Physical Exam  Constitutional: She is oriented to person, place, and time. She appears well-developed and well-nourished.  Neck: Normal range of motion. Neck supple. No JVD present. No tracheal deviation present. No thyromegaly present.  Pulmonary/Chest: No stridor. No respiratory distress. She has no wheezes. She has no rales. She exhibits no tenderness.  Abdominal: She exhibits no mass. There is no tenderness. There is no rebound and no guarding.  Lymphadenopathy:    She has no cervical adenopathy.    She has axillary adenopathy.       Left axillary: Lateral adenopathy present.  L axilla -- ? seb cyst Not tender [place]  l inguinal pea sized nodule-- non tender  Neurological: She is alert and oriented to person, place, and time.  Psychiatric: She has a normal mood and affect. Her behavior is normal. Judgment and thought content normal.  Nursing note and vitals reviewed.  BP 110/60 (BP Location: Left Arm, Patient Position: Sitting, Cuff Size: Normal)   Pulse 90   Temp 99.3 F (37.4 C) (Oral)   SpO2 98%  Wt Readings from Last 3 Encounters:  12/11/15 127 lb 9.6 oz (57.9 kg)  09/18/15 129 lb 6.4 oz (58.7 kg)  09/14/15 128 lb 2 oz (58.1 kg)     Lab Results  Component Value Date   WBC 5.5 11/02/2014   HGB 13.9 11/02/2014   HCT 40.9 11/02/2014   PLT 223.0 11/02/2014   GLUCOSE 83 11/02/2014   CHOL 189 05/13/2012   TRIG 129.0 05/13/2012   HDL 68.60 05/13/2012   LDLCALC 95 05/13/2012   ALT 13 11/02/2014   AST 13 11/02/2014   NA 138 11/02/2014   K 3.7 11/02/2014   CL 106 11/02/2014   CREATININE 0.92 11/02/2014   BUN 11 11/02/2014   CO2 25 11/02/2014   TSH 0.80 05/13/2012    Dg Chest 2 View  Result Date: 02/05/2013 *RADIOLOGY REPORT* Clinical Data: Cough, fever CHEST - 2 VIEW  Comparison: 06/09/2010 Findings: Lungs are clear. No pleural effusion or pneumothorax. Cardiomediastinal silhouette is within normal limits. Visualized osseous structures are within normal limits. IMPRESSION: No evidence of acute cardiopulmonary disease. Original Report Authenticated By: Julian Hy, M.D.     Assessment & Plan:  Plan  I have discontinued Ms. Fields's lamoTRIgine and lamoTRIgine. I am also having her maintain her fish oil-omega-3 fatty acids, Vitamin C, FOLIC ACID PO, IRON PO, Calcium Carbonate-Vitamin D, valACYclovir, OVER THE COUNTER MEDICATION, Betamethasone Valerate, urea, topiramate, levonorgestrel, ondansetron, Cholecalciferol (VITAMIN D PO), omeprazole, albuterol, budesonide-formoterol, TRINTELLIX, montelukast, moxifloxacin, and bacitracin-polymyxin b.  Meds ordered this encounter  Medications  . DISCONTD: lamoTRIgine (LAMICTAL) 150 MG tablet    Sig: Take 150 mg by mouth daily.    Refill:  1    Problem List Items Addressed This Visit    None    Visit Diagnoses    High risk sexual behavior    -  Primary   Relevant Orders   Urine cytology ancillary only   HIV antibody   POCT urinalysis dipstick   RPR   Vitamin B12   Other fatigue       Relevant Orders   Vitamin B12   CBC with Differential/Platelet   Monospot   Epstein-Barr virus VCA antibody panel      Follow-up: No Follow-up on file.  Ann Held, DO

## 2016-02-23 NOTE — Progress Notes (Signed)
Pre visit review using our clinic review tool, if applicable. No additional management support is needed unless otherwise documented below in the visit note. 

## 2016-02-23 NOTE — Patient Instructions (Signed)

## 2016-02-26 ENCOUNTER — Other Ambulatory Visit (HOSPITAL_COMMUNITY)
Admission: RE | Admit: 2016-02-26 | Discharge: 2016-02-26 | Disposition: A | Discharge status: Home / Self Care | Payer: BLUE CROSS/BLUE SHIELD | Source: Ambulatory Visit | Attending: Family Medicine | Admitting: Family Medicine

## 2016-02-26 ENCOUNTER — Other Ambulatory Visit (INDEPENDENT_AMBULATORY_CARE_PROVIDER_SITE_OTHER): Payer: BLUE CROSS/BLUE SHIELD

## 2016-02-26 DIAGNOSIS — Z7251 High risk heterosexual behavior: Secondary | ICD-10-CM

## 2016-02-26 DIAGNOSIS — Z113 Encounter for screening for infections with a predominantly sexual mode of transmission: Secondary | ICD-10-CM | POA: Insufficient documentation

## 2016-02-26 DIAGNOSIS — N760 Acute vaginitis: Secondary | ICD-10-CM | POA: Diagnosis present

## 2016-02-26 DIAGNOSIS — R5383 Other fatigue: Secondary | ICD-10-CM | POA: Diagnosis not present

## 2016-02-26 LAB — CBC WITH DIFFERENTIAL/PLATELET
Basophils Absolute: 0 10*3/uL (ref 0.0–0.1)
Basophils Relative: 0.9 % (ref 0.0–3.0)
Eosinophils Absolute: 0.1 10*3/uL (ref 0.0–0.7)
Eosinophils Relative: 1.7 % (ref 0.0–5.0)
HCT: 40 % (ref 36.0–46.0)
Hemoglobin: 13.7 g/dL (ref 12.0–15.0)
Lymphocytes Relative: 30.5 % (ref 12.0–46.0)
Lymphs Abs: 1.5 10*3/uL (ref 0.7–4.0)
MCHC: 34.3 g/dL (ref 30.0–36.0)
MCV: 90.7 fl (ref 78.0–100.0)
Monocytes Absolute: 0.4 10*3/uL (ref 0.1–1.0)
Monocytes Relative: 8.3 % (ref 3.0–12.0)
Neutro Abs: 2.8 10*3/uL (ref 1.4–7.7)
Neutrophils Relative %: 58.6 % (ref 43.0–77.0)
Platelets: 233 10*3/uL (ref 150.0–400.0)
RBC: 4.41 Mil/uL (ref 3.87–5.11)
RDW: 12.9 % (ref 11.5–15.5)
WBC: 4.8 10*3/uL (ref 4.0–10.5)

## 2016-02-26 LAB — POCT URINALYSIS DIPSTICK
Bilirubin, UA: NEGATIVE
Blood, UA: NEGATIVE
Glucose, UA: NEGATIVE
Ketones, UA: NEGATIVE
Leukocytes, UA: NEGATIVE
Nitrite, UA: NEGATIVE
Protein, UA: NEGATIVE
Spec Grav, UA: 1.025
Urobilinogen, UA: NEGATIVE
pH, UA: 6

## 2016-02-26 LAB — HIV ANTIBODY (ROUTINE TESTING W REFLEX): HIV 1&2 Ab, 4th Generation: NONREACTIVE

## 2016-02-26 LAB — VITAMIN B12: Vitamin B-12: 786 pg/mL (ref 211–911)

## 2016-02-26 LAB — MONONUCLEOSIS SCREEN: Mono Screen: NEGATIVE

## 2016-02-27 ENCOUNTER — Other Ambulatory Visit: Payer: Self-pay | Admitting: Family Medicine

## 2016-02-27 ENCOUNTER — Other Ambulatory Visit: Payer: Self-pay

## 2016-02-27 DIAGNOSIS — R1032 Left lower quadrant pain: Secondary | ICD-10-CM

## 2016-02-27 LAB — URINE CYTOLOGY ANCILLARY ONLY
Chlamydia: NEGATIVE
Neisseria Gonorrhea: NEGATIVE
Trichomonas: NEGATIVE

## 2016-02-27 LAB — EPSTEIN-BARR VIRUS VCA ANTIBODY PANEL
EBV NA IgG: <18 U/mL
EBV VCA IgG: 26.3 U/mL — ABNORMAL HIGH
EBV VCA IgM: <36 U/mL

## 2016-02-27 LAB — RPR

## 2016-02-27 MED ORDER — FLUCONAZOLE 150 MG PO TABS: 150.0000 mg | ORAL_TABLET | Freq: Once | ORAL | 0 refills | Status: AC

## 2016-02-27 MED ORDER — CEPHALEXIN 500 MG PO CAPS: 500.0000 mg | ORAL_CAPSULE | Freq: Two times a day (BID) | ORAL | 0 refills | Status: DC

## 2016-02-28 ENCOUNTER — Telehealth: Payer: Self-pay | Admitting: Family Medicine

## 2016-02-28 ENCOUNTER — Other Ambulatory Visit: Payer: Self-pay

## 2016-02-28 DIAGNOSIS — R1909 Other intra-abdominal and pelvic swelling, mass and lump: Secondary | ICD-10-CM

## 2016-02-28 DIAGNOSIS — R591 Generalized enlarged lymph nodes: Secondary | ICD-10-CM

## 2016-02-28 DIAGNOSIS — R599 Enlarged lymph nodes, unspecified: Secondary | ICD-10-CM

## 2016-02-28 NOTE — Telephone Encounter (Signed)
°  Relationship to patient: Self  Can be reached: [phone]   Reason for call: Request call back prior to order being placed for [place]. States there are other spots that she has found within the last 24 hours

## 2016-02-29 LAB — URINE CYTOLOGY ANCILLARY ONLY
Bacterial vaginitis: NEGATIVE
Candida vaginitis: NEGATIVE

## 2016-02-29 NOTE — Telephone Encounter (Signed)
We can refer to ENT for further evaluation

## 2016-02-29 NOTE — Telephone Encounter (Signed)
C/o another mass under her arm (bilateral axillary area's) and chest, under her jaw line and neck. All of these lymph nodes are swollen and she said she is still tired, having headaches. She has no started the ABX yet and she really wants to know what is going on?   KP

## 2016-03-01 NOTE — Addendum Note (Signed)
Addended by: Ewing Schlein on: 03/01/2016 06:06 PM   Modules accepted: Orders

## 2016-03-01 NOTE — Telephone Encounter (Signed)
Called and left a message for call back  

## 2016-03-01 NOTE — Telephone Encounter (Signed)
I will defer to Dr Etter Sjogren as she is treating her for this

## 2016-03-01 NOTE — Telephone Encounter (Signed)
She is requesting that all of the lymphnodes get [place], the ones int he chest wall and in the neck area as well. ENT referral placed.    KP

## 2016-03-01 NOTE — Telephone Encounter (Signed)
Sorry. Please advise    KP

## 2016-03-01 NOTE — Telephone Encounter (Signed)
Patient want's [place] of all of the area's, I made her aware that if the insurance company does not find It necessary they may not pay for it per Dr.Lowne and  Dr.Lowne is requesting that she takes the Abx and the patient said she did not want to take it, because she would like to get to the bottom of it.  Dr.Lowne is willing to do the [place] and the patient is aware, she asked me about the CT scan and I made her aware that it is still pending review. She said she was confused. I tried to explain the best way I could and the patient thanked me and said she needed to find a DO, I made her aware Dr.Lowne was a DO and she said ok, thanks me again and h/u.    KP

## 2016-03-01 NOTE — Telephone Encounter (Signed)
We did not discuss chest --- she would need to be seen for that [place] not done usually for chest

## 2016-03-04 ENCOUNTER — Other Ambulatory Visit: Payer: Self-pay | Admitting: Family Medicine

## 2016-03-04 DIAGNOSIS — R590 Localized enlarged lymph nodes: Secondary | ICD-10-CM

## 2016-04-16 ENCOUNTER — Encounter: Payer: Self-pay | Admitting: Family Medicine

## 2016-04-20 ENCOUNTER — Encounter: Payer: Self-pay | Admitting: Family Medicine

## 2016-05-10 ENCOUNTER — Telehealth: Payer: Self-pay | Admitting: Family Medicine

## 2016-05-10 ENCOUNTER — Other Ambulatory Visit: Payer: Self-pay | Admitting: Family Medicine

## 2016-05-10 NOTE — Telephone Encounter (Signed)
[place] Relationship to patient: Can be [phone] Pharmacy:  Reason for call:Please add Bil Diagnostic tomo mammogram procedure [id number]

## 2016-05-15 NOTE — Telephone Encounter (Signed)
Dr. Lowne---please advise.   

## 2016-05-16 ENCOUNTER — Telehealth: Payer: Self-pay

## 2016-05-16 DIAGNOSIS — R928 Other abnormal and inconclusive findings on diagnostic imaging of breast: Secondary | ICD-10-CM

## 2016-05-16 NOTE — Telephone Encounter (Signed)
Order added for bil mammogram tomo. LB

## 2016-05-16 NOTE — Telephone Encounter (Signed)
Ok to add

## 2016-05-27 ENCOUNTER — Telehealth: Payer: Self-pay | Admitting: Family Medicine

## 2016-05-27 ENCOUNTER — Other Ambulatory Visit: Payer: Self-pay | Admitting: Family Medicine

## 2016-05-27 DIAGNOSIS — R928 Other abnormal and inconclusive findings on diagnostic imaging of breast: Secondary | ICD-10-CM

## 2016-05-27 NOTE — Telephone Encounter (Signed)
This order was placed today. 

## 2016-05-27 NOTE — Telephone Encounter (Signed)
Amber, with the [facility] calling to request updated order for mammogram.  Please enter [id number] for diagnostic bilateral breast mammogram with implants.  Amber states Dr. Etter Sjogren entered initial order for ultrasound but she was directed to pcp office for updated order.

## 2016-06-12 DIAGNOSIS — D729 Disorder of white blood cells, unspecified: Secondary | ICD-10-CM | POA: Diagnosis not present

## 2016-06-12 DIAGNOSIS — R5382 Chronic fatigue, unspecified: Secondary | ICD-10-CM | POA: Diagnosis not present

## 2016-06-12 DIAGNOSIS — E2749 Other adrenocortical insufficiency: Secondary | ICD-10-CM | POA: Diagnosis not present

## 2016-06-12 DIAGNOSIS — E039 Hypothyroidism, unspecified: Secondary | ICD-10-CM | POA: Diagnosis not present

## 2016-06-13 ENCOUNTER — Other Ambulatory Visit: Payer: Self-pay | Admitting: Family Medicine

## 2016-06-13 DIAGNOSIS — R599 Enlarged lymph nodes, unspecified: Secondary | ICD-10-CM

## 2016-06-13 DIAGNOSIS — R591 Generalized enlarged lymph nodes: Secondary | ICD-10-CM

## 2016-06-20 DIAGNOSIS — F509 Eating disorder, unspecified: Secondary | ICD-10-CM | POA: Diagnosis not present

## 2016-07-04 ENCOUNTER — Encounter (HOSPITAL_BASED_OUTPATIENT_CLINIC_OR_DEPARTMENT_OTHER): Payer: Self-pay | Admitting: Emergency Medicine

## 2016-07-04 ENCOUNTER — Emergency Department (HOSPITAL_BASED_OUTPATIENT_CLINIC_OR_DEPARTMENT_OTHER)
Admission: EM | Admit: 2016-07-04 | Discharge: 2016-07-05 | Disposition: A | Discharge status: Home / Self Care | Payer: BLUE CROSS/BLUE SHIELD | Attending: Emergency Medicine | Admitting: Emergency Medicine

## 2016-07-04 DIAGNOSIS — N200 Calculus of kidney: Secondary | ICD-10-CM

## 2016-07-04 DIAGNOSIS — Z79899 Other long term (current) drug therapy: Secondary | ICD-10-CM | POA: Insufficient documentation

## 2016-07-04 DIAGNOSIS — N202 Calculus of kidney with calculus of ureter: Secondary | ICD-10-CM | POA: Diagnosis not present

## 2016-07-04 DIAGNOSIS — N132 Hydronephrosis with renal and ureteral calculous obstruction: Secondary | ICD-10-CM | POA: Diagnosis not present

## 2016-07-04 DIAGNOSIS — R109 Unspecified abdominal pain: Secondary | ICD-10-CM | POA: Diagnosis present

## 2016-07-04 HISTORY — DX: Bipolar disorder, unspecified: F31.9

## 2016-07-04 MED ORDER — KETOROLAC TROMETHAMINE 30 MG/ML IJ SOLN
30.0000 mg | Freq: Once | INTRAMUSCULAR | Status: AC
  Administered 2016-07-05: 30 mg via INTRAVENOUS
  Filled 2016-07-04: qty 1

## 2016-07-04 MED ORDER — ONDANSETRON HCL 4 MG/2ML IJ SOLN
4.0000 mg | Freq: Once | INTRAMUSCULAR | Status: AC
  Administered 2016-07-05: 4 mg via INTRAVENOUS
  Filled 2016-07-04: qty 2

## 2016-07-04 MED ORDER — HYDROMORPHONE HCL 1 MG/ML IJ SOLN
1.0000 mg | Freq: Once | INTRAMUSCULAR | Status: AC
  Administered 2016-07-05: 1 mg via INTRAVENOUS
  Filled 2016-07-04: qty 1

## 2016-07-04 NOTE — ED Triage Notes (Signed)
Pt with left flank pain x 90 min denies fever vomiting.

## 2016-07-04 NOTE — ED Provider Notes (Signed)
[facility] Provider Note   CSN: [id number] Arrival date & time: 07/04/16  2332  By signing my name below, I, Kelsey [name], attest that this documentation has been prepared under the direction and in the presence of physician practitioner, Veryl Speak, MD. Electronically Signed: Dora [name], Scribe. 07/04/2016. 11:44 PM.  History   Chief Complaint Chief Complaint  Patient presents with  . Flank Pain    The history is provided by the patient. No language interpreter was used.     HPI Comments: Kelsey [name] is a 40 y.o. female who presents to the Emergency Department complaining of sudden onset, constant, severe, left flank pain beginning a few hours ago. She reports a h/o kidney stones and states her current pain feels exactly the same. She reports some intermittent chills since onset as well. Pt reports she has an IUD (Mirena) and does not have menstrual periods. She was followed by [facility] for her last kidney stone but does not know if they were surgically removed or if they passed on their own. She denies fever, constipation, or any other associated symptoms.  Past Medical History:  Diagnosis Date  . Abnormal Pap smear   . Anemia   . Bipolar disorder ([place])   . Chronic sinusitis   . Headache(784.0)   . Pyelonephritis 2008   Pylo and stones in the past  . Sinus problem    sinusitis    Patient Active Problem List   Diagnosis Date Noted  . Acute frontal sinusitis 09/14/2015  . Asthma with allergic rhinitis 12/12/2014  . Vitamin D deficiency 12/12/2014  . Nausea without vomiting 12/12/2014  . Mold exposure 11/07/2014  . UTI (urinary tract infection) 11/02/2014  . Pain in the abdomen 11/02/2014  . Left forearm pain 04/20/2014  . Abdominal pain, epigastric 03/24/2014  . Nevus of finger 09/14/2013  . Migraine, unspecified, without mention of intractable migraine without mention of status migrainosus 06/21/2013  . Bulimia nervosa  06/21/2013  . Bipolar 2 disorder, major depressive episode ([place]) 06/21/2013  . Acute asthmatic bronchitis 02/05/2013  . Streptococcal sore throat 02/03/2013  . Routine general medical examination at a health care facility 05/13/2012  . Contact dermatitis 05/04/2012  . Postpartum care following cesarean delivery (2/23) 08/31/2011  . Bunion of great toe 12/26/2010  . Hematuria 12/26/2010  . Recurrent sinusitis 11/05/2010  . UTI (lower urinary tract infection) 11/05/2010  . 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 UNSPECIFIED OTITIS MEDIA 07/16/2010  . RHINITIS 07/16/2010  . MYALGIA 06/25/2010  . VERTIGO 05/07/2010  . LOW BACK PAIN SYNDROME 03/02/2010  . SACROILIITIS 12/26/2009  . NEOPLASM OF UNCERTAIN BEHAVIOR OF SKIN 10/03/2008    Past Surgical History:  Procedure Laterality Date  . CESAREAN SECTION    . CESAREAN SECTION  08/31/2011   Procedure: CESAREAN SECTION;  Surgeon: [name], MD;  Location: [facility];  Service: Gynecology;  Laterality: [name];  Repeat cesarean section with delivery of baby   . COLPOSCOPY    . KNEE ARTHROSCOPY     right knee  . WISDOM TOOTH EXTRACTION      OB History    Gravida Para Term Preterm AB Living   2 2 2     2    SAB TAB Ectopic Multiple Live Births           1       Home Medications    Prior to Admission medications   Medication Sig Start Date End Date Taking? Authorizing Provider  lamoTRIgine (LAMICTAL) 200  MG tablet Take 200 mg by mouth daily.   Yes Historical Provider, MD  thyroid (ARMOUR) 60 MG tablet Take 60 mg by mouth daily before breakfast.   Yes Historical Provider, MD  Cholecalciferol (VITAMIN D PO) Take 5,000 mg by mouth 2 (two) times daily.    Historical Provider, MD  levonorgestrel (MIRENA) 20 MCG/24HR IUD 1 each by Intrauterine route once.    Historical Provider, MD  topiramate (TOPAMAX) 100 MG tablet Take 100 mg by mouth 2 (two) times daily.    Historical Provider, MD  TRINTELLIX 10 MG TABS TK 1 T PO  D. STOP TAKING  PROZAC 09/12/15   Historical Provider, MD  urea (CARMOL) 40 % CREA Apply topically bid. 05/14/12   Midge Minium, MD  valACYclovir (VALTREX) 500 MG tablet Take 500 mg by mouth daily. Reported on 09/18/2015    Historical Provider, MD    Family History Family History  Problem Relation Age of Onset  . Heart disease Brother     sudden cardiac death  . Asthma Brother   . Heart disease Maternal Grandfather 26    MI  . Coronary artery disease Maternal Grandfather   . Diabetes Maternal Grandfather   . Kidney disease Maternal Grandfather   . Cancer Neg Hx   . Stroke Neg Hx   . Hypertension Neg Hx     Social History Social History  Substance Use Topics  . Smoking status: Never Smoker  . Smokeless tobacco: Never Used  . Alcohol use No     Allergies   Morphine and related and Sulfa antibiotics   Review of Systems Review of Systems  Constitutional: Positive for chills. Negative for fever.  Gastrointestinal: Negative for constipation.  Genitourinary: Positive for flank pain (left).  All other systems reviewed and are negative.    Physical Exam Updated Vital Signs BP 142/97 (BP Location: Right Arm)   Pulse 76   Temp 97.5 F (36.4 C) (Oral)   Resp 24   Ht 5\' 2"  (1.575 m)   Wt 130 lb (59 kg)   LMP  (LMP Unknown)   SpO2 100%   BMI 23.78 kg/m   Physical Exam  Constitutional: She is oriented to person, place, and time. She appears well-developed and well-nourished. No distress.  HENT:  Head: Normocephalic and atraumatic.  Right Ear: Hearing normal.  Left Ear: Hearing normal.  Nose: Nose normal.  Mouth/Throat: Oropharynx is clear and moist and mucous membranes are normal.  Eyes: Conjunctivae and EOM are normal. Pupils are equal, round, and reactive to light.  Neck: Normal range of motion. Neck supple.  Cardiovascular: Regular rhythm, S1 normal and S2 normal.  Exam reveals no gallop and no friction rub.   No murmur heard. Pulmonary/Chest: Effort normal and breath  sounds normal. No respiratory distress. She exhibits no tenderness.  Abdominal: Soft. Normal appearance and bowel sounds are normal. There is no hepatosplenomegaly. There is tenderness. There is no rebound, no guarding, no tenderness at McBurney's point and negative Murphy's sign. No hernia.  Mild left sided CVA tenderness.  Musculoskeletal: Normal range of motion.  Neurological: She is alert and oriented to person, place, and time. She has normal strength. No cranial nerve deficit or sensory deficit. Coordination normal. GCS eye subscore is 4. GCS verbal subscore is 5. GCS motor subscore is 6.  Skin: Skin is warm, dry and intact. No rash noted. No cyanosis.  Psychiatric: She has a normal mood and affect. Her speech is normal and behavior is normal. Thought content  normal.  Nursing note and vitals reviewed.    ED Treatments / Results  Labs (all labs ordered are listed, but only abnormal results are displayed) Labs Reviewed  BASIC METABOLIC PANEL  CBC WITH DIFFERENTIAL/PLATELET  PREGNANCY, URINE  URINALYSIS, ROUTINE W REFLEX MICROSCOPIC    EKG  EKG Interpretation None       Radiology No results found.  Procedures Procedures (including critical care time)  DIAGNOSTIC STUDIES: Oxygen Saturation is 100% on RA, normal by my interpretation.    COORDINATION OF CARE: 11:51 PM Discussed treatment plan with pt at bedside and pt agreed to plan.  Medications Ordered in ED Medications  ondansetron (ZOFRAN) injection 4 mg (not administered)  ketorolac (TORADOL) 30 MG/ML injection 30 mg (not administered)  HYDROmorphone (DILAUDID) injection 1 mg (not administered)     Initial Impression / Assessment and Plan / ED Course  I have reviewed the triage vital signs and the nursing notes.  Pertinent labs & imaging results that were available during my care of the patient were reviewed by me and considered in my medical decision making (see chart for details).  Clinical Course      Patient presents with sudden onset of left flank pain that appears to be related to a 3 mm kidney stone at the left UVJ area she is feeling better with medications given in the ER. She will be discharged with pain medication and when necessary follow-up with urology.  Her urine is nitrite positive, however I this is related to the Azo that she took prior to coming here. There are no white cells or bacteria in her urine. She has no fever or white count.  Final Clinical Impressions(s) / ED Diagnoses   Final diagnoses:  None    New Prescriptions New Prescriptions   No medications on file   I personally performed the services described in this documentation, which was scribed in my presence. The recorded information has been reviewed and is accurate.       Veryl Speak, MD 07/05/16 [id number]

## 2016-07-05 ENCOUNTER — Emergency Department (HOSPITAL_BASED_OUTPATIENT_CLINIC_OR_DEPARTMENT_OTHER): Payer: BLUE CROSS/BLUE SHIELD

## 2016-07-05 DIAGNOSIS — N202 Calculus of kidney with calculus of ureter: Secondary | ICD-10-CM | POA: Diagnosis not present

## 2016-07-05 LAB — BASIC METABOLIC PANEL
Anion gap: 5 (ref 5–15)
BUN: 16 mg/dL (ref 6–20)
CO2: 26 mmol/L (ref 22–32)
Calcium: 9.2 mg/dL (ref 8.9–10.3)
Chloride: 108 mmol/L (ref 101–111)
Creatinine, Ser: 1.01 mg/dL — ABNORMAL HIGH (ref 0.44–1.00)
GFR calc Af Amer: >60 mL/min (ref >60)
GFR calc non Af Amer: >60 mL/min (ref >60)
Glucose, Bld: 103 mg/dL — ABNORMAL HIGH (ref 65–99)
Potassium: 3.9 mmol/L (ref 3.5–5.1)
Sodium: 139 mmol/L (ref 135–145)

## 2016-07-05 LAB — CBC WITH DIFFERENTIAL/PLATELET
Basophils Absolute: 0 10*3/uL (ref 0.0–0.1)
Basophils Relative: 1 %
Eosinophils Absolute: 0.2 10*3/uL (ref 0.0–0.7)
Eosinophils Relative: 2 %
HCT: 39.9 % (ref 36.0–46.0)
Hemoglobin: 13.4 g/dL (ref 12.0–15.0)
Lymphocytes Relative: 32 %
Lymphs Abs: 2.6 10*3/uL (ref 0.7–4.0)
MCH: 30.7 pg (ref 26.0–34.0)
MCHC: 33.6 g/dL (ref 30.0–36.0)
MCV: 91.3 fL (ref 78.0–100.0)
Monocytes Absolute: 0.9 10*3/uL (ref 0.1–1.0)
Monocytes Relative: 11 %
Neutro Abs: 4.5 10*3/uL (ref 1.7–7.7)
Neutrophils Relative %: 54 %
Platelets: 244 10*3/uL (ref 150–400)
RBC: 4.37 MIL/uL (ref 3.87–5.11)
RDW: 12.6 % (ref 11.5–15.5)
WBC: 8.2 10*3/uL (ref 4.0–10.5)

## 2016-07-05 LAB — URINALYSIS, MICROSCOPIC (REFLEX)

## 2016-07-05 LAB — URINALYSIS, ROUTINE W REFLEX MICROSCOPIC
Bilirubin Urine: NEGATIVE
Glucose, UA: NEGATIVE mg/dL
Ketones, ur: NEGATIVE mg/dL
Nitrite: POSITIVE — AB
Protein, ur: NEGATIVE mg/dL
Specific Gravity, Urine: 1.015 (ref 1.005–1.030)
pH: 7.5 (ref 5.0–8.0)

## 2016-07-05 LAB — PREGNANCY, URINE: Preg Test, Ur: NEGATIVE

## 2016-07-05 MED ORDER — OXYCODONE-ACETAMINOPHEN 5-325 MG PO TABS: 1.0000 | ORAL_TABLET | Freq: Four times a day (QID) | ORAL | 0 refills | Status: DC | PRN

## 2016-07-05 NOTE — Discharge Instructions (Signed)
Percocet as prescribed as needed for pain.  Follow-up with urology on Monday if you have not past the stone. Return to the ER if you develop high fevers, worsening pain not controlled with Percocet, or other new and concerning symptoms.

## 2016-07-10 ENCOUNTER — Ambulatory Visit (INDEPENDENT_AMBULATORY_CARE_PROVIDER_SITE_OTHER): Payer: Self-pay | Admitting: Medical

## 2016-07-10 VITALS — BP 115/76 | HR 83 | Temp 98.7°F | Resp 16 | Ht 63.0 in | Wt 136.2 lb

## 2016-07-10 DIAGNOSIS — J0100 Acute maxillary sinusitis, unspecified: Secondary | ICD-10-CM

## 2016-07-10 DIAGNOSIS — J111 Influenza due to unidentified influenza virus with other respiratory manifestations: Secondary | ICD-10-CM

## 2016-07-10 MED ORDER — FLUCONAZOLE 150 MG PO TABS: 150.0000 mg | ORAL_TABLET | Freq: Once | ORAL | 0 refills | Status: AC

## 2016-07-10 MED ORDER — AMOXICILLIN-POT CLAVULANATE 875-125 MG PO TABS: 1.0000 | ORAL_TABLET | Freq: Two times a day (BID) | ORAL | 0 refills | Status: DC

## 2016-07-10 MED ORDER — FLUTICASONE PROPIONATE 50 MCG/ACT NA SUSP: 2.0000 | Freq: Every day | NASAL | 1 refills | Status: DC

## 2016-07-10 MED ORDER — BENZONATATE 100 MG PO CAPS: 100.0000 mg | ORAL_CAPSULE | Freq: Three times a day (TID) | ORAL | 0 refills | Status: DC | PRN

## 2016-07-10 MED ORDER — OSELTAMIVIR PHOSPHATE 75 MG PO CAPS: 75.0000 mg | ORAL_CAPSULE | Freq: Two times a day (BID) | ORAL | 0 refills | Status: DC

## 2016-07-10 MED ORDER — PREDNISONE 10 MG PO TABS
ORAL_TABLET | ORAL | 0 refills | Status: DC
Start: 2016-07-10 — End: 2018-02-12

## 2016-07-10 NOTE — Patient Instructions (Addendum)
Flu like symptoms past 2 days with appearance of secondary  lt ear infection and sinus infection.(some bronchitis symptoms as well with wheezing).  Start tamiflu today. Rest hydrate and tylenol for fever.  Rx augmentin antibiotic, benzonatate for cough and flonse for nasal congestion.  For wheezing continue your inhalers. If wheezing worsens then start prednisone taper dose rx.  Follow up in 7 days or as needed.  Diflucan rx if you get yeast infection while on antibiotic.

## 2016-07-10 NOTE — Progress Notes (Signed)
Subjective:    Patient ID: Kelsey [name], female    DOB: Aug 07, 1975, 41 y.o.   MRN: [id number]  HPI   Pt in with nasal and chest congestion. Mid st with ear pain and pnd. Some productive cough. Some sinus pressure with yellow green mucous when blows her nose. She has had fatigue with some body aches and low grade fever over past 2 days.   Pt states her body has aches diffusely and has some fatigue. Pt states first were the  diffuse bodyaches then followed all other above symptoms.  No flu vaccine this year.    [name] is on mirena.    Review of Systems  Constitutional: Positive for fatigue and fever. Negative for chills and diaphoresis.  HENT: Positive for congestion, ear pain, sinus pain and sinus pressure.   Respiratory: Positive for cough and wheezing. Negative for chest tightness and shortness of breath.        Some use of inhaler. Pt has be symbicort and albuterol. Some recent worsening.  Cardiovascular: Negative for chest pain and palpitations.  Gastrointestinal: Positive for nausea. Negative for abdominal pain and vomiting.  Musculoskeletal:       Some body aches.  Skin: Negative for rash.  Neurological: Positive for headaches.  Hematological: Negative for adenopathy. Does not bruise/bleed easily.  Psychiatric/Behavioral: Negative for behavioral problems and confusion.    Past Medical History:  Diagnosis Date  . Abnormal Pap smear   . Anemia   . Bipolar disorder ([place])   . Chronic sinusitis   . Headache(784.0)   . Pyelonephritis 2008   Pylo and stones in the past  . Sinus problem    sinusitis     Social History   Social History  . Marital status: Married    Spouse name: [name]  . Number of children: 2  . Years of education: [name]   Occupational History  . clinical social worker Self-Employed   Social History Main Topics  . Smoking status: Never Smoker  . Smokeless tobacco: Never Used  . Alcohol use No  . Drug use: No  . Sexual activity: Yes    Other Topics Concern  . Not on file   Social History Narrative   1 daughter--Kelsey [name] born 12/09, 1 step son    Past Surgical History:  Procedure Laterality Date  . CESAREAN SECTION    . CESAREAN SECTION  08/31/2011   Procedure: CESAREAN SECTION;  Surgeon: [name], MD;  Location: [facility];  Service: Gynecology;  Laterality: [name];  Repeat cesarean section with delivery of baby   . COLPOSCOPY    . KNEE ARTHROSCOPY     right knee  . WISDOM TOOTH EXTRACTION      Family History  Problem Relation Age of Onset  . Heart disease Brother     sudden cardiac death  . Asthma Brother   . Heart disease Maternal Grandfather 11    MI  . Coronary artery disease Maternal Grandfather   . Diabetes Maternal Grandfather   . Kidney disease Maternal Grandfather   . Cancer Neg Hx   . Stroke Neg Hx   . Hypertension Neg Hx     Allergies  Allergen Reactions  . Morphine And Related Hives  . Sulfa Antibiotics Nausea And Vomiting    Current Outpatient Prescriptions on File Prior to Visit  Medication Sig Dispense Refill  . Cholecalciferol (VITAMIN D PO) Take 5,000 mg by mouth 2 (two) times daily.    [name] (LAMICTAL) 200 MG  tablet Take 200 mg by mouth daily.    [name] (MIRENA) 20 MCG/24HR IUD 1 each by Intrauterine route once.    . thyroid (ARMOUR) 60 MG tablet Take 60 mg by mouth daily before breakfast.    . topiramate (TOPAMAX) 100 MG tablet Take 100 mg by mouth 2 (two) times daily.    . valACYclovir (VALTREX) 500 MG tablet Take 500 mg by mouth daily. Reported on 09/18/2015     No current facility-administered medications on file prior to visit.     BP 115/76 (BP Location: Left Arm, Patient Position: Sitting, Cuff Size: Normal)   Pulse 83   Temp 98.7 F (37.1 C) (Oral)   Resp 16   Ht 5\' 3"  (1.6 m)   Wt 136 lb 4 oz (61.8 kg)   LMP  (LMP Unknown)   SpO2 99%   BMI 24.14 kg/m       Objective:   Physical Exam  General  Mental Status - Alert. General  Appearance - Well groomed. Not in acute distress.  Skin Rashes- No Rashes.  HEENT Head- Normal. Ear Auditory Canal - Left- Normal. Right - Normal.Tympanic Membrane- Left- red Right- Normal. Eye Sclera/Conjunctiva- Left- Normal. Right- Normal. Nose & Sinuses Nasal Mucosa- Left-  Boggy and Congested. Right-  Boggy and  Congested.Bilateral mild  maxillary and frontal sinus pressure. Mouth & Throat Lips: Upper Lip- Normal: no dryness, cracking, pallor, cyanosis, or vesicular eruption. Lower Lip-Normal: no dryness, cracking, pallor, cyanosis or vesicular eruption. Buccal Mucosa- Bilateral- No Aphthous ulcers. Oropharynx- No Discharge or Erythema. Tonsils: Characteristics- Bilateral- No Erythema or Congestion. Size/Enlargement- Bilateral- No enlargement. Discharge- bilateral-None.  Neck Neck- Supple. No Masses. Good range of motion. Not stiff.   Chest and Lung Exam Auscultation: Breath Sounds:-Clear even and unlabored.  Cardiovascular Auscultation:Rythm- Regular, rate and rhythm. Murmurs & Other Heart Sounds:Ausculatation of the heart reveal- No Murmurs.  Lymphatic Head & Neck General Head & Neck Lymphatics: Bilateral: Description- No Localized lymphadenopathy.       Assessment & Plan:  Flu like symptoms past 2 days with appearance of secondary  lt ear infection and sinus infection.(some bronchitis symptoms as well with wheezing).  Start tamiflu today. Rest hydrate and tylenol for fever.  Rx augmentin antibiotic, benzonatate for cough and flonse for nasal congestion.  For wheezing continue your inhalers. If wheezing worsens then start prednisone taper dose rx.  Follow up in 7 days or as needed.  Diflucan rx if you get yeast infection while on antibiotic.  [name], Percell Miller, PA-C

## 2016-07-10 NOTE — Progress Notes (Signed)
Pre visit review using our clinic review tool, if applicable. No additional management support is needed unless otherwise documented below in the visit note/SLS  

## 2016-07-18 DIAGNOSIS — F509 Eating disorder, unspecified: Secondary | ICD-10-CM | POA: Diagnosis not present

## 2016-07-23 DIAGNOSIS — E039 Hypothyroidism, unspecified: Secondary | ICD-10-CM | POA: Diagnosis not present

## 2016-07-23 DIAGNOSIS — K589 Irritable bowel syndrome without diarrhea: Secondary | ICD-10-CM | POA: Diagnosis not present

## 2016-07-23 DIAGNOSIS — E559 Vitamin D deficiency, unspecified: Secondary | ICD-10-CM | POA: Diagnosis not present

## 2016-07-23 DIAGNOSIS — R5382 Chronic fatigue, unspecified: Secondary | ICD-10-CM | POA: Diagnosis not present

## 2016-07-30 DIAGNOSIS — F509 Eating disorder, unspecified: Secondary | ICD-10-CM | POA: Diagnosis not present

## 2016-08-02 DIAGNOSIS — F509 Eating disorder, unspecified: Secondary | ICD-10-CM | POA: Diagnosis not present

## 2016-08-15 DIAGNOSIS — R5382 Chronic fatigue, unspecified: Secondary | ICD-10-CM | POA: Diagnosis not present

## 2016-08-15 DIAGNOSIS — E039 Hypothyroidism, unspecified: Secondary | ICD-10-CM | POA: Diagnosis not present

## 2016-08-15 DIAGNOSIS — R7989 Other specified abnormal findings of blood chemistry: Secondary | ICD-10-CM | POA: Diagnosis not present

## 2016-08-15 DIAGNOSIS — E2749 Other adrenocortical insufficiency: Secondary | ICD-10-CM | POA: Diagnosis not present

## 2016-08-22 DIAGNOSIS — F3341 Major depressive disorder, recurrent, in partial remission: Secondary | ICD-10-CM | POA: Diagnosis not present

## 2016-08-23 DIAGNOSIS — F509 Eating disorder, unspecified: Secondary | ICD-10-CM | POA: Diagnosis not present

## 2016-08-29 DIAGNOSIS — F509 Eating disorder, unspecified: Secondary | ICD-10-CM | POA: Diagnosis not present

## 2016-09-05 DIAGNOSIS — F509 Eating disorder, unspecified: Secondary | ICD-10-CM | POA: Diagnosis not present

## 2016-09-19 DIAGNOSIS — F4323 Adjustment disorder with mixed anxiety and depressed mood: Secondary | ICD-10-CM | POA: Diagnosis not present

## 2016-09-26 DIAGNOSIS — F4323 Adjustment disorder with mixed anxiety and depressed mood: Secondary | ICD-10-CM | POA: Diagnosis not present

## 2016-09-26 DIAGNOSIS — E039 Hypothyroidism, unspecified: Secondary | ICD-10-CM | POA: Diagnosis not present

## 2016-09-26 DIAGNOSIS — E2749 Other adrenocortical insufficiency: Secondary | ICD-10-CM | POA: Diagnosis not present

## 2016-09-26 DIAGNOSIS — R7989 Other specified abnormal findings of blood chemistry: Secondary | ICD-10-CM | POA: Diagnosis not present

## 2016-09-26 DIAGNOSIS — R5382 Chronic fatigue, unspecified: Secondary | ICD-10-CM | POA: Diagnosis not present

## 2016-10-10 DIAGNOSIS — F4323 Adjustment disorder with mixed anxiety and depressed mood: Secondary | ICD-10-CM | POA: Diagnosis not present

## 2016-10-24 DIAGNOSIS — F4323 Adjustment disorder with mixed anxiety and depressed mood: Secondary | ICD-10-CM | POA: Diagnosis not present

## 2016-11-07 DIAGNOSIS — F4323 Adjustment disorder with mixed anxiety and depressed mood: Secondary | ICD-10-CM | POA: Diagnosis not present

## 2016-11-12 DIAGNOSIS — E079 Disorder of thyroid, unspecified: Secondary | ICD-10-CM | POA: Diagnosis not present

## 2016-11-12 DIAGNOSIS — Z801 Family history of malignant neoplasm of trachea, bronchus and lung: Secondary | ICD-10-CM | POA: Diagnosis not present

## 2016-11-12 DIAGNOSIS — R5383 Other fatigue: Secondary | ICD-10-CM | POA: Diagnosis not present

## 2016-11-12 DIAGNOSIS — R21 Rash and other nonspecific skin eruption: Secondary | ICD-10-CM | POA: Diagnosis not present

## 2016-11-12 DIAGNOSIS — R591 Generalized enlarged lymph nodes: Secondary | ICD-10-CM | POA: Diagnosis not present

## 2016-11-12 DIAGNOSIS — Z8041 Family history of malignant neoplasm of ovary: Secondary | ICD-10-CM | POA: Diagnosis not present

## 2016-11-14 DIAGNOSIS — F3341 Major depressive disorder, recurrent, in partial remission: Secondary | ICD-10-CM | POA: Diagnosis not present

## 2016-11-15 DIAGNOSIS — R590 Localized enlarged lymph nodes: Secondary | ICD-10-CM | POA: Diagnosis not present

## 2016-11-15 DIAGNOSIS — R591 Generalized enlarged lymph nodes: Secondary | ICD-10-CM | POA: Diagnosis not present

## 2016-11-15 DIAGNOSIS — A6920 Lyme disease, unspecified: Secondary | ICD-10-CM | POA: Diagnosis not present

## 2016-11-19 DIAGNOSIS — R21 Rash and other nonspecific skin eruption: Secondary | ICD-10-CM | POA: Diagnosis not present

## 2016-11-19 DIAGNOSIS — E079 Disorder of thyroid, unspecified: Secondary | ICD-10-CM | POA: Diagnosis not present

## 2016-11-19 DIAGNOSIS — R591 Generalized enlarged lymph nodes: Secondary | ICD-10-CM | POA: Diagnosis not present

## 2016-11-19 DIAGNOSIS — R5383 Other fatigue: Secondary | ICD-10-CM | POA: Diagnosis not present

## 2016-11-21 DIAGNOSIS — F4323 Adjustment disorder with mixed anxiety and depressed mood: Secondary | ICD-10-CM | POA: Diagnosis not present

## 2016-11-28 DIAGNOSIS — E039 Hypothyroidism, unspecified: Secondary | ICD-10-CM | POA: Diagnosis not present

## 2016-11-28 DIAGNOSIS — J029 Acute pharyngitis, unspecified: Secondary | ICD-10-CM | POA: Diagnosis not present

## 2016-11-28 DIAGNOSIS — R591 Generalized enlarged lymph nodes: Secondary | ICD-10-CM | POA: Diagnosis not present

## 2016-11-28 DIAGNOSIS — R5382 Chronic fatigue, unspecified: Secondary | ICD-10-CM | POA: Diagnosis not present

## 2016-11-28 DIAGNOSIS — E2749 Other adrenocortical insufficiency: Secondary | ICD-10-CM | POA: Diagnosis not present

## 2016-12-07 DIAGNOSIS — N760 Acute vaginitis: Secondary | ICD-10-CM | POA: Diagnosis not present

## 2016-12-07 DIAGNOSIS — B373 Candidiasis of vulva and vagina: Secondary | ICD-10-CM | POA: Diagnosis not present

## 2016-12-07 DIAGNOSIS — R300 Dysuria: Secondary | ICD-10-CM | POA: Diagnosis not present

## 2016-12-19 DIAGNOSIS — F4323 Adjustment disorder with mixed anxiety and depressed mood: Secondary | ICD-10-CM | POA: Diagnosis not present

## 2016-12-30 DIAGNOSIS — N7689 Other specified inflammation of vagina and vulva: Secondary | ICD-10-CM | POA: Diagnosis not present

## 2017-01-16 DIAGNOSIS — F4323 Adjustment disorder with mixed anxiety and depressed mood: Secondary | ICD-10-CM | POA: Diagnosis not present

## 2017-02-20 DIAGNOSIS — F3341 Major depressive disorder, recurrent, in partial remission: Secondary | ICD-10-CM | POA: Diagnosis not present

## 2017-02-26 DIAGNOSIS — E039 Hypothyroidism, unspecified: Secondary | ICD-10-CM | POA: Diagnosis not present

## 2017-02-26 DIAGNOSIS — R5382 Chronic fatigue, unspecified: Secondary | ICD-10-CM | POA: Diagnosis not present

## 2017-02-26 DIAGNOSIS — E2749 Other adrenocortical insufficiency: Secondary | ICD-10-CM | POA: Diagnosis not present

## 2017-02-26 DIAGNOSIS — E559 Vitamin D deficiency, unspecified: Secondary | ICD-10-CM | POA: Diagnosis not present

## 2017-03-06 DIAGNOSIS — R591 Generalized enlarged lymph nodes: Secondary | ICD-10-CM | POA: Diagnosis not present

## 2017-03-06 DIAGNOSIS — E2749 Other adrenocortical insufficiency: Secondary | ICD-10-CM | POA: Diagnosis not present

## 2017-03-06 DIAGNOSIS — R5382 Chronic fatigue, unspecified: Secondary | ICD-10-CM | POA: Diagnosis not present

## 2017-03-06 DIAGNOSIS — E039 Hypothyroidism, unspecified: Secondary | ICD-10-CM | POA: Diagnosis not present

## 2017-05-19 DIAGNOSIS — F3341 Major depressive disorder, recurrent, in partial remission: Secondary | ICD-10-CM | POA: Diagnosis not present

## 2017-07-08 IMAGING — CT CT RENAL STONE PROTOCOL
2 of 4 series · 16 of 46 positions shown, 18 images · non-contrast
Comparison: None.

CLINICAL DATA: Sudden onset of left flank pain. History of renal
stone.

EXAM:
CT ABDOMEN AND PELVIS WITHOUT CONTRAST
TECHNIQUE: Multidetector CT imaging of the abdomen and pelvis was performed
following the standard protocol without IV contrast.

[Series 2: axial st · axial · 0.65mm/px · z∈[-460,-65]mm · 13 of 87 slices shown, 15 images]
[im 4/87  soft-tissue]
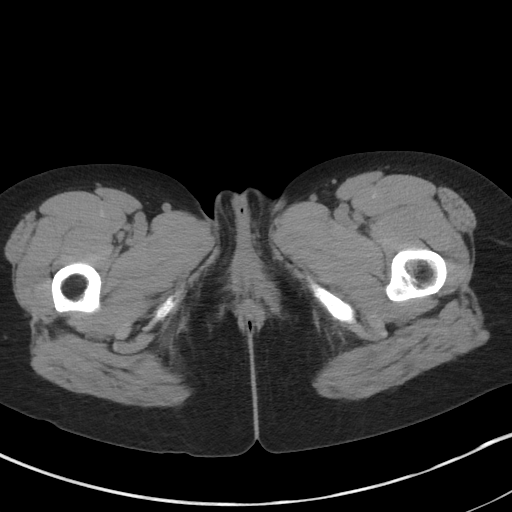
[im 4/87  bone]
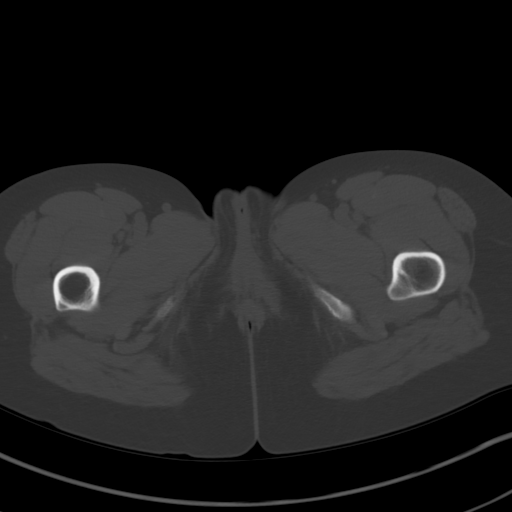
[im 11/87  soft-tissue]
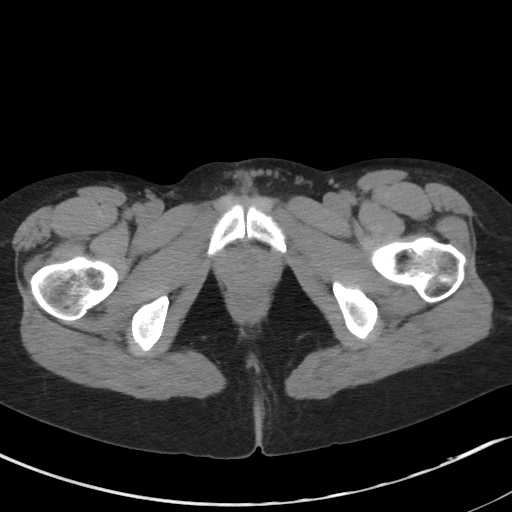
[im 18/87  soft-tissue]
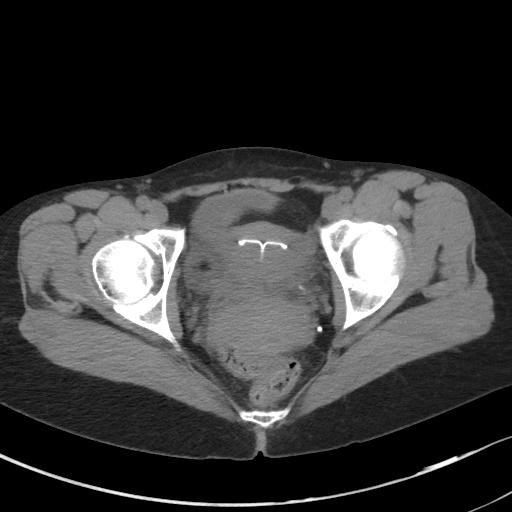
[im 25/87  soft-tissue]
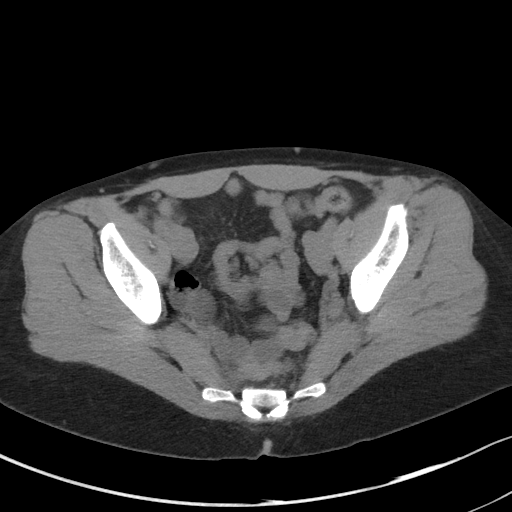
[im 31/87  soft-tissue]
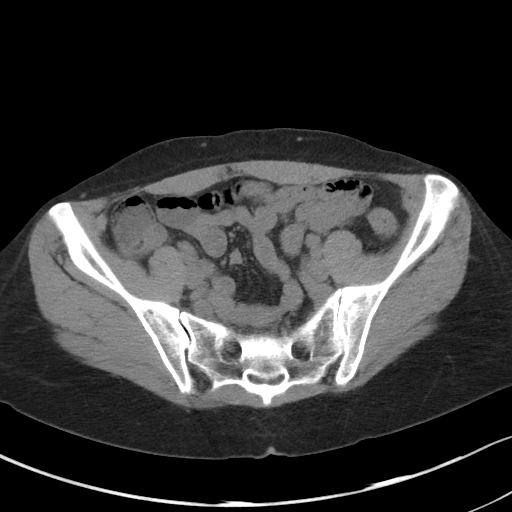
[im 38/87  soft-tissue]
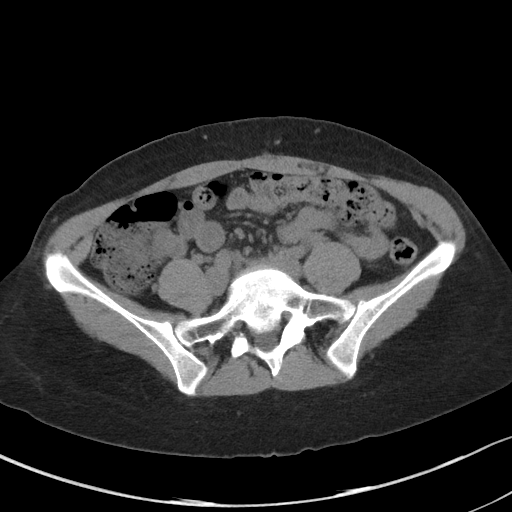
[im 45/87  soft-tissue]
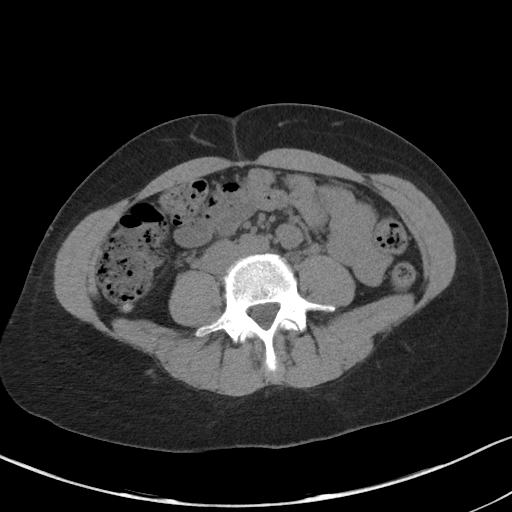
[im 49/87  soft-tissue]
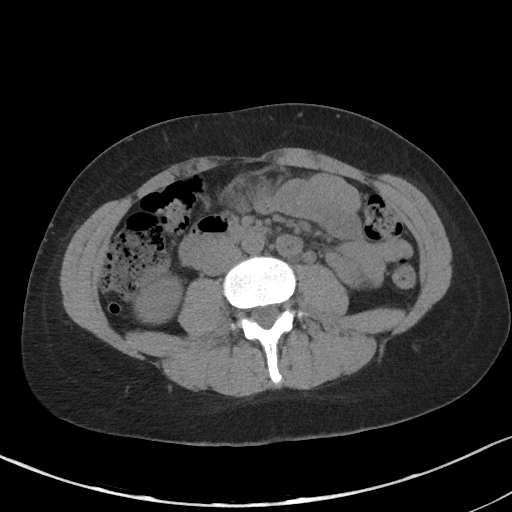
[im 56/87  soft-tissue]
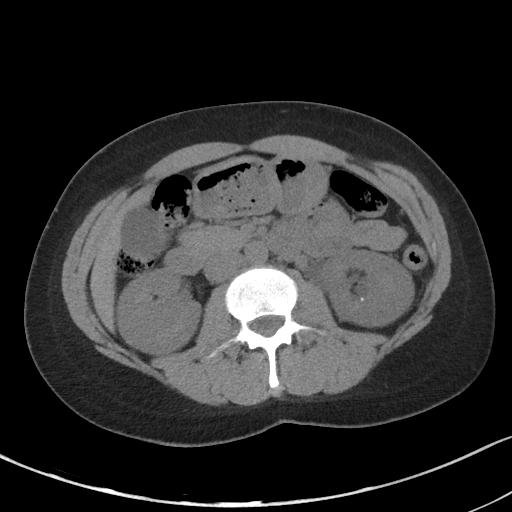
[im 56/87  bone]
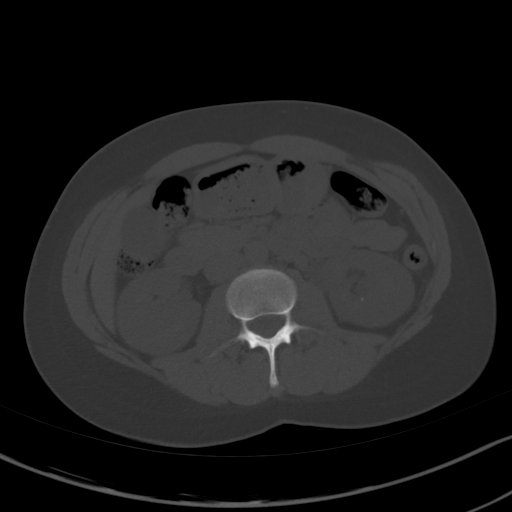
[im 62/87  soft-tissue]
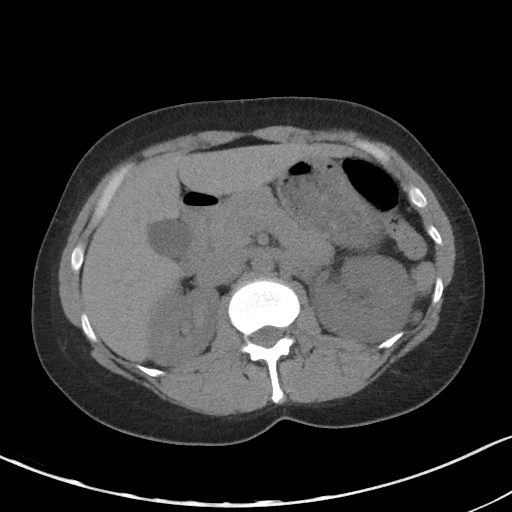
[im 69/87  soft-tissue]
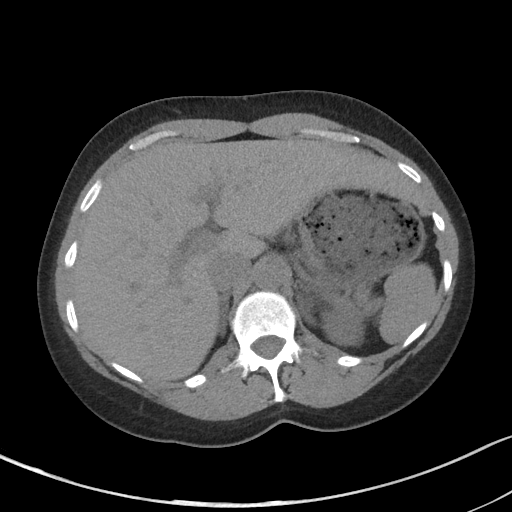
[im 76/87  soft-tissue]
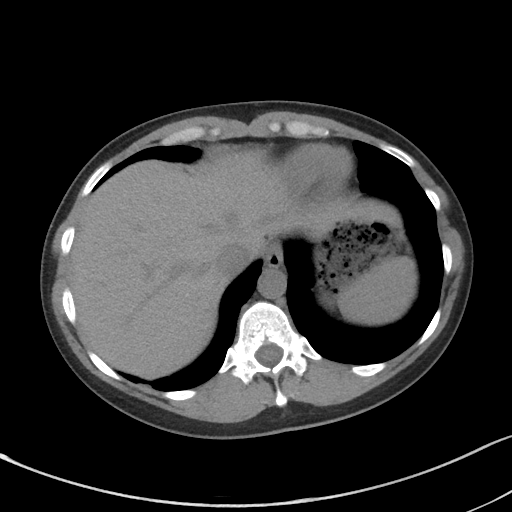
[im 83/87  soft-tissue]
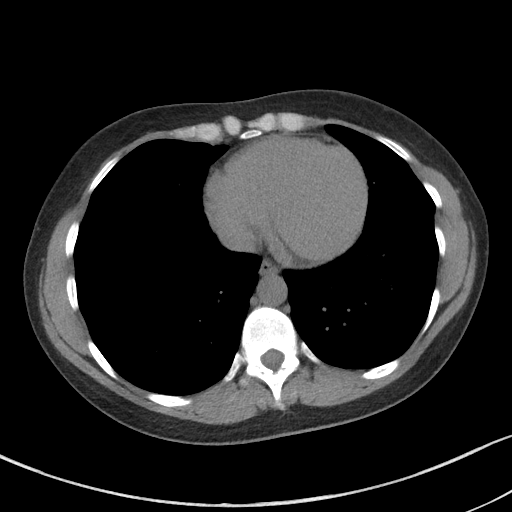

[Series 5: coronal st · coronal · 0.72mm/px · 3 of 66 slices shown]
[im 22/66  soft-tissue]
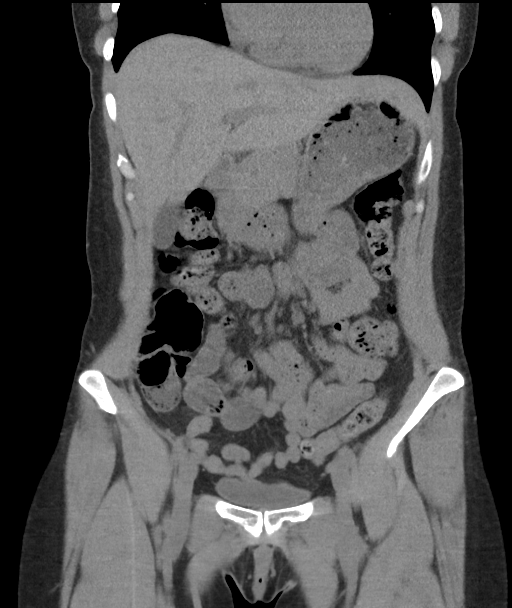
[im 29/66  soft-tissue]
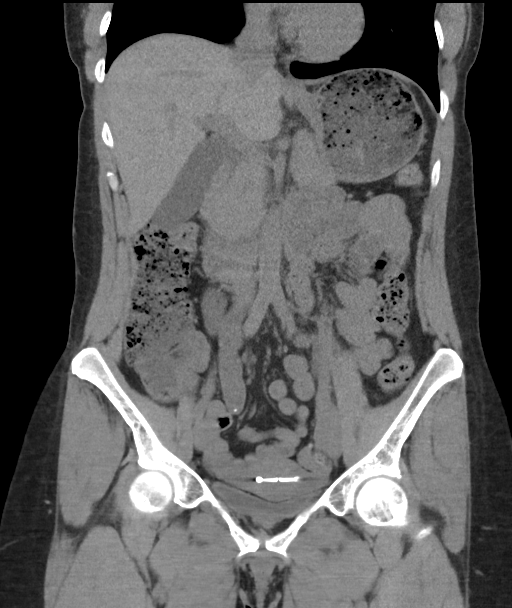
[im 37/66  soft-tissue]
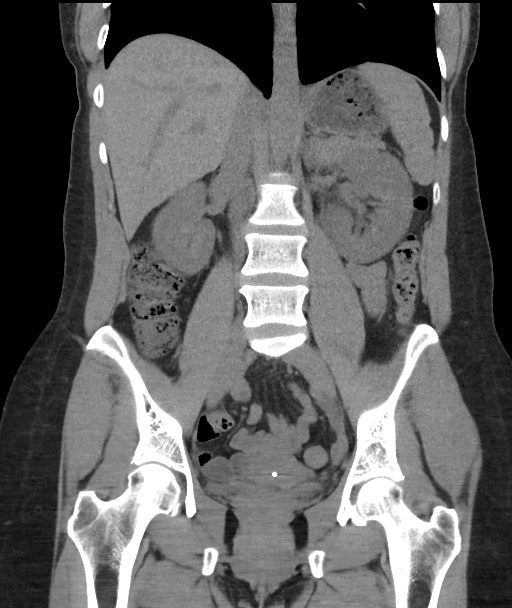

[16 of 46 positions shown; findings below may reference images not displayed]

FINDINGS: Lower chest: Lung bases are clear.

Hepatobiliary: No focal liver abnormality is seen. No gallstones,
gallbladder wall thickening, or biliary dilatation.

Pancreas: No ductal dilatation or inflammation.

Spleen: Normal in size without focal abnormality.

Adrenals/Urinary Tract: 3 mm obstructing stone at the left
ureterovesicular junction with moderate proximal hydronephrosis.
Mild left perinephric edema. There is a punctate nonobstructing
stone in the lower left kidney. There is a punctate nonobstructing
stone in the lower right kidney without right hydronephrosis. Right
ureter is decompressed. Urinary bladder is decompressed without
stone.

Stomach/Bowel: Stomach distended with ingested contents. No bowel
wall thickening, distention or inflammation. Normal appendix.

Vascular/Lymphatic: No significant vascular findings are present. No
enlarged abdominal or pelvic lymph nodes.

Reproductive: IUD appropriately positioned in the uterus. No adnexal
mass.

Other: No free air, free fluid, or intra-abdominal fluid collection.

Musculoskeletal: There are no acute or suspicious osseous
abnormalities.
IMPRESSION: 1. Obstructing 3 mm stone at the left ureterovesicular junction with
moderate hydronephrosis.
2. Punctate nonobstructing bilateral renal calculi.

## 2017-07-29 DIAGNOSIS — F509 Eating disorder, unspecified: Secondary | ICD-10-CM | POA: Diagnosis not present

## 2017-07-29 DIAGNOSIS — E2749 Other adrenocortical insufficiency: Secondary | ICD-10-CM | POA: Diagnosis not present

## 2017-07-29 DIAGNOSIS — E039 Hypothyroidism, unspecified: Secondary | ICD-10-CM | POA: Diagnosis not present

## 2017-07-29 DIAGNOSIS — R5383 Other fatigue: Secondary | ICD-10-CM | POA: Diagnosis not present

## 2017-07-29 DIAGNOSIS — D729 Disorder of white blood cells, unspecified: Secondary | ICD-10-CM | POA: Diagnosis not present

## 2017-08-06 DIAGNOSIS — R5382 Chronic fatigue, unspecified: Secondary | ICD-10-CM | POA: Diagnosis not present

## 2017-08-06 DIAGNOSIS — R6882 Decreased libido: Secondary | ICD-10-CM | POA: Diagnosis not present

## 2017-08-06 DIAGNOSIS — E559 Vitamin D deficiency, unspecified: Secondary | ICD-10-CM | POA: Diagnosis not present

## 2017-08-06 DIAGNOSIS — E039 Hypothyroidism, unspecified: Secondary | ICD-10-CM | POA: Diagnosis not present

## 2017-09-02 DIAGNOSIS — J020 Streptococcal pharyngitis: Secondary | ICD-10-CM | POA: Diagnosis not present

## 2017-09-08 DIAGNOSIS — F3341 Major depressive disorder, recurrent, in partial remission: Secondary | ICD-10-CM | POA: Diagnosis not present

## 2017-09-23 DIAGNOSIS — J029 Acute pharyngitis, unspecified: Secondary | ICD-10-CM | POA: Diagnosis not present

## 2017-11-06 DIAGNOSIS — E559 Vitamin D deficiency, unspecified: Secondary | ICD-10-CM | POA: Diagnosis not present

## 2017-11-06 DIAGNOSIS — E039 Hypothyroidism, unspecified: Secondary | ICD-10-CM | POA: Diagnosis not present

## 2017-11-06 DIAGNOSIS — R5383 Other fatigue: Secondary | ICD-10-CM | POA: Diagnosis not present

## 2017-11-13 DIAGNOSIS — E039 Hypothyroidism, unspecified: Secondary | ICD-10-CM | POA: Diagnosis not present

## 2017-11-13 DIAGNOSIS — E559 Vitamin D deficiency, unspecified: Secondary | ICD-10-CM | POA: Diagnosis not present

## 2017-11-13 DIAGNOSIS — R5382 Chronic fatigue, unspecified: Secondary | ICD-10-CM | POA: Diagnosis not present

## 2017-11-13 DIAGNOSIS — R6882 Decreased libido: Secondary | ICD-10-CM | POA: Diagnosis not present

## 2017-12-03 DIAGNOSIS — F3341 Major depressive disorder, recurrent, in partial remission: Secondary | ICD-10-CM | POA: Diagnosis not present

## 2018-01-12 DIAGNOSIS — Z6826 Body mass index (BMI) 26.0-26.9, adult: Secondary | ICD-10-CM | POA: Diagnosis not present

## 2018-01-12 DIAGNOSIS — Z01419 Encounter for gynecological examination (general) (routine) without abnormal findings: Secondary | ICD-10-CM | POA: Diagnosis not present

## 2018-01-12 DIAGNOSIS — Z1151 Encounter for screening for human papillomavirus (HPV): Secondary | ICD-10-CM | POA: Diagnosis not present

## 2018-01-12 LAB — HM MAMMOGRAPHY: HM Mammogram: NORMAL (ref 0–4)

## 2018-01-12 LAB — HM PAP SMEAR

## 2018-01-22 DIAGNOSIS — H6092 Unspecified otitis externa, left ear: Secondary | ICD-10-CM | POA: Diagnosis not present

## 2018-02-05 DIAGNOSIS — B370 Candidal stomatitis: Secondary | ICD-10-CM | POA: Diagnosis not present

## 2018-02-05 DIAGNOSIS — R5382 Chronic fatigue, unspecified: Secondary | ICD-10-CM | POA: Diagnosis not present

## 2018-02-05 DIAGNOSIS — L409 Psoriasis, unspecified: Secondary | ICD-10-CM | POA: Diagnosis not present

## 2018-02-05 DIAGNOSIS — E039 Hypothyroidism, unspecified: Secondary | ICD-10-CM | POA: Diagnosis not present

## 2018-02-10 ENCOUNTER — Telehealth: Payer: Self-pay

## 2018-02-10 NOTE — Telephone Encounter (Signed)
Please advise 

## 2018-02-10 NOTE — Telephone Encounter (Signed)
Sorry I am no longer taking new patients

## 2018-02-10 NOTE — Telephone Encounter (Signed)
Copied from [place]. Topic: Quick Communication - See Telephone Encounter >> Feb 10, 2018  4:42 PM Antonieta Iba C wrote: CRM for notification. See Telephone encounter for: 02/10/18.  Pt would like to switch providers to Dr. Janett Billow Copland. Please advise if provider will take pt on as a pt?   CB: [phone]

## 2018-02-11 DIAGNOSIS — R5382 Chronic fatigue, unspecified: Secondary | ICD-10-CM | POA: Diagnosis not present

## 2018-02-11 NOTE — Telephone Encounter (Signed)
Informed patient that Dr. Lorelei Pont is no longer accepting new patients. I noticed it was showing as Dr. Etter Sjogren was her pcp. She said that she only seen Dr. Etter Sjogren for acute visits and she should not be showing as her normal pcp. I put her primary care provider back as Dr. Birdie Riddle since she stated this should not show Dr. Etter Sjogren. Patient said she will just go back to Dr. Birdie Riddle.

## 2018-02-11 NOTE — Telephone Encounter (Signed)
Please inform Pt that Dr. Lorelei Pont is no longer accepting new patients at this time.

## 2018-02-12 ENCOUNTER — Ambulatory Visit (INDEPENDENT_AMBULATORY_CARE_PROVIDER_SITE_OTHER): Payer: BLUE CROSS/BLUE SHIELD | Admitting: Family Medicine

## 2018-02-12 ENCOUNTER — Encounter: Payer: Self-pay | Admitting: Family Medicine

## 2018-02-12 ENCOUNTER — Other Ambulatory Visit: Payer: Self-pay

## 2018-02-12 VITALS — BP 130/81 | HR 88 | Temp 99.2°F | Resp 16 | Ht 63.0 in | Wt 149.5 lb

## 2018-02-12 DIAGNOSIS — E039 Hypothyroidism, unspecified: Secondary | ICD-10-CM | POA: Diagnosis not present

## 2018-02-12 DIAGNOSIS — B3781 Candidal esophagitis: Secondary | ICD-10-CM

## 2018-02-12 DIAGNOSIS — J309 Allergic rhinitis, unspecified: Secondary | ICD-10-CM

## 2018-02-12 DIAGNOSIS — B370 Candidal stomatitis: Secondary | ICD-10-CM | POA: Diagnosis not present

## 2018-02-12 DIAGNOSIS — M059 Rheumatoid arthritis with rheumatoid factor, unspecified: Secondary | ICD-10-CM

## 2018-02-12 MED ORDER — MELOXICAM 15 MG PO TABS: 15.0000 mg | ORAL_TABLET | Freq: Every day | ORAL | 1 refills | Status: DC

## 2018-02-12 NOTE — Progress Notes (Signed)
   Subjective:    Patient ID: Kelsey [name], female    DOB: 1975-11-01, 42 y.o.   MRN: [id number]  HPI  Pt was seeing [facility] and then switched to [facility].  They encouraged her to re-establish PCP.    ? Thrush- pt was on compounded Nystatin and Amphotericin.  Saw GYN last month and was told there was no yeast present.  Was given Nystatin swish and spit- started yesterday- but [facility] recommended a culture but this was not done.  White spots in mouth have been going on x8 months.  Thyroid 'issues'- 25 lb weight gain in 2 yrs, cold intolerance, fatigue.  Seeing Dr Owens Shark ([facility]) and labs are pending.  Pt has hx of bulimia and the 25 lb weight gain is very stressful.  + RA titer- pt has + RA screen and also + titer of 20 (normal <10).  Bilateral ear pain- no drainage, no fever  Review of Systems For ROS see HPI     Objective:   Physical Exam  Constitutional: She appears well-developed and well-nourished. No distress.  HENT:  Head: Normocephalic and atraumatic.  Right Ear: Tympanic membrane normal.  Left Ear: Tympanic membrane normal.  Nose: Mucosal edema and rhinorrhea present. Right sinus exhibits no maxillary sinus tenderness and no frontal sinus tenderness. Left sinus exhibits no maxillary sinus tenderness and no frontal sinus tenderness.  Mouth/Throat: Mucous membranes are normal. Posterior oropharyngeal erythema (w/ PND) present.  Scant white area on posterior tongue  Eyes: Pupils are equal, round, and reactive to light. Conjunctivae and EOM are normal.  Neck: Normal range of motion. Neck supple.  Cardiovascular: Normal rate, regular rhythm and normal heart sounds.  Pulmonary/Chest: Effort normal and breath sounds normal. No respiratory distress. She has no wheezes. She has no rales.  Lymphadenopathy:    She has no cervical adenopathy.  Vitals reviewed.         Assessment & Plan:  Thrush-  new to provider, ongoing for pt.  She is currently on Nystatin swish and spit but no culture has been done.  Current PE is not consistent w/ thrush.  Culture collected.  No change in tx until results available.  RF positive- new.  Reviewed this w/ pt as she was not aware.  Stressed need for formal work up of this lab finding prior to adding more medications or treatments.  Refer to Rheum for complete evaluation.

## 2018-02-12 NOTE — Patient Instructions (Signed)
Follow up in 4-6 weeks to recheck thyroid and rheumatologic issues We'll notify you of your fungal culture results Continue the Nystatin swish and spit We'll call you with your Rheumatology appt for the + Rheumatoid Factor START daily Zyrtec (store brand generic is perfectly acceptable) Call with any questions or concerns Hang in there!

## 2018-02-16 DIAGNOSIS — Z1231 Encounter for screening mammogram for malignant neoplasm of breast: Secondary | ICD-10-CM | POA: Diagnosis not present

## 2018-02-19 ENCOUNTER — Telehealth: Payer: Self-pay | Admitting: Family Medicine

## 2018-02-19 NOTE — Telephone Encounter (Signed)
Copied from [place] [id number]. Topic: Quick Communication - See Telephone Encounter >> Feb 19, 2018  8:08 AM Gardiner Ramus wrote: CRM for notification. See Telephone encounter for: 02/19/18. Pt called and stated that she has not recived a call about Rheumatology referral. Pt would like an update. Please call pt after 12:30 if possible. Please advise

## 2018-02-23 ENCOUNTER — Other Ambulatory Visit: Payer: Self-pay | Admitting: Family Medicine

## 2018-02-23 DIAGNOSIS — K146 Glossodynia: Secondary | ICD-10-CM

## 2018-03-03 DIAGNOSIS — R87810 Cervical high risk human papillomavirus (HPV) DNA test positive: Secondary | ICD-10-CM | POA: Diagnosis not present

## 2018-03-03 DIAGNOSIS — Z3202 Encounter for pregnancy test, result negative: Secondary | ICD-10-CM | POA: Diagnosis not present

## 2018-03-03 DIAGNOSIS — R87610 Atypical squamous cells of undetermined significance on cytologic smear of cervix (ASC-US): Secondary | ICD-10-CM | POA: Diagnosis not present

## 2018-03-11 DIAGNOSIS — Z77010 Contact with and (suspected) exposure to arsenic: Secondary | ICD-10-CM | POA: Diagnosis not present

## 2018-03-11 DIAGNOSIS — R5382 Chronic fatigue, unspecified: Secondary | ICD-10-CM | POA: Diagnosis not present

## 2018-03-11 DIAGNOSIS — E039 Hypothyroidism, unspecified: Secondary | ICD-10-CM | POA: Diagnosis not present

## 2018-03-13 LAB — FUNGUS CULTURE W SMEAR
MICRO NUMBER:: 90943600
SMEAR:: NONE SEEN
SPECIMEN QUALITY:: ADEQUATE

## 2018-03-23 ENCOUNTER — Encounter: Payer: Self-pay | Admitting: Family Medicine

## 2018-03-23 ENCOUNTER — Ambulatory Visit: Payer: BLUE CROSS/BLUE SHIELD | Admitting: Family Medicine

## 2018-03-23 ENCOUNTER — Other Ambulatory Visit: Payer: Self-pay

## 2018-03-23 VITALS — BP 130/81 | HR 76 | Temp 98.1°F | Resp 16 | Ht 63.0 in | Wt 155.1 lb

## 2018-03-23 DIAGNOSIS — Z23 Encounter for immunization: Secondary | ICD-10-CM | POA: Diagnosis not present

## 2018-03-23 DIAGNOSIS — R768 Other specified abnormal immunological findings in serum: Secondary | ICD-10-CM | POA: Diagnosis not present

## 2018-03-23 DIAGNOSIS — M542 Cervicalgia: Secondary | ICD-10-CM

## 2018-03-23 DIAGNOSIS — Z77010 Contact with and (suspected) exposure to arsenic: Secondary | ICD-10-CM | POA: Diagnosis not present

## 2018-03-23 NOTE — Progress Notes (Signed)
   Subjective:    Patient ID: Kelsey [name], female    DOB: 03/26/1976, 42 y.o.   MRN: [id number]  HPI Neck pain- has mostly resolved w/ addition of Meloxicam.  Has Rheumatology appt on Monday.  Elevated Arsenic level- discovered by [facility] and was started on 'Dextox' regimen of supplements.  Pt's level was 4 (normal range is 0-12)  + Rheumatoid factor- has appt w/ Rheum on Monday for complete evaluation.  Review of Systems For ROS see HPI     Objective:   Physical Exam  Constitutional: She is oriented to person, place, and time. She appears well-developed and well-nourished. No distress.  HENT:  Head: Normocephalic and atraumatic.  Eyes: Pupils are equal, round, and reactive to light. Conjunctivae and EOM are normal.  Neurological: She is alert and oriented to person, place, and time.  Skin: Skin is warm and dry.  Psychiatric: She has a normal mood and affect. Her behavior is normal. Thought content normal.  Vitals reviewed.         Assessment & Plan:  Neck pain- improved, nearly resolved w/ addition of Meloxicam.  Continue at this time.  Will follow  Arsenic- reviewed pt's recent labs.  Her Arsenic level is well WNL and not at all elevated.  Discussed this w/ pt and indicated that there was no need for a 'detox' regimen.  Pt was relieved as she was concerned about her children also being exposed.  Pt thankfully doesn't have another appt w/ [facility] until December which will allow time for Rheumatology evaluation and normalization of thyroid levels.  Will follow.

## 2018-03-23 NOTE — Patient Instructions (Signed)
Schedule your complete physical in 6 months Our goals are 1) thyroid regulation and 2) Rheum evaluation Try not to make too many changes at once Call with any questions or concerns Hang in there! Happy Early Rudene Anda!!!

## 2018-03-24 ENCOUNTER — Encounter: Payer: Self-pay | Admitting: Family Medicine

## 2018-03-24 NOTE — Assessment & Plan Note (Signed)
Ongoing issue.  Pt has upcoming appt w/ Dr Earnest Conroy in [facility].  Discussed that prior to making more changes, this needs to be worked up and treated if needed.  Pt expressed understanding and is in agreement w/ plan.

## 2018-03-30 DIAGNOSIS — M549 Dorsalgia, unspecified: Secondary | ICD-10-CM | POA: Diagnosis not present

## 2018-03-30 DIAGNOSIS — M059 Rheumatoid arthritis with rheumatoid factor, unspecified: Secondary | ICD-10-CM | POA: Diagnosis not present

## 2018-03-30 DIAGNOSIS — M1990 Unspecified osteoarthritis, unspecified site: Secondary | ICD-10-CM | POA: Diagnosis not present

## 2018-03-30 DIAGNOSIS — M533 Sacrococcygeal disorders, not elsewhere classified: Secondary | ICD-10-CM | POA: Diagnosis not present

## 2018-03-30 DIAGNOSIS — L409 Psoriasis, unspecified: Secondary | ICD-10-CM | POA: Diagnosis not present

## 2018-03-31 ENCOUNTER — Encounter: Payer: Self-pay | Admitting: Family Medicine

## 2018-03-31 NOTE — Assessment & Plan Note (Signed)
Deteriorated.  This is the cause of pt's bilateral ear pain.  Start daily antihistamine.  Reviewed supportive care and red flags that should prompt return.  Pt expressed understanding and is in agreement w/ plan.

## 2018-03-31 NOTE — Assessment & Plan Note (Signed)
Ongoing issue for pt.  She is seeing multiple providers- including integrative medicine and has a lab panel pending.  She has multiple sxs of hypothyroid and is on Armour thyroid.  Will defer further management of this to her current providers unless pt states a desire for me to intervene.

## 2018-04-01 ENCOUNTER — Other Ambulatory Visit: Payer: Self-pay | Admitting: *Deleted

## 2018-04-01 DIAGNOSIS — E039 Hypothyroidism, unspecified: Secondary | ICD-10-CM

## 2018-04-29 ENCOUNTER — Ambulatory Visit (INDEPENDENT_AMBULATORY_CARE_PROVIDER_SITE_OTHER): Payer: BLUE CROSS/BLUE SHIELD | Admitting: Internal Medicine

## 2018-04-29 ENCOUNTER — Encounter: Payer: Self-pay | Admitting: Internal Medicine

## 2018-04-29 VITALS — BP 128/80 | HR 84 | Ht 63.0 in | Wt 154.0 lb

## 2018-04-29 DIAGNOSIS — R5383 Other fatigue: Secondary | ICD-10-CM

## 2018-04-29 DIAGNOSIS — E039 Hypothyroidism, unspecified: Secondary | ICD-10-CM

## 2018-04-29 LAB — T4, FREE: Free T4: 0.79 ng/dL (ref 0.60–1.60)

## 2018-04-29 LAB — T3, FREE: T3, Free: 4.1 pg/mL (ref 2.3–4.2)

## 2018-04-29 LAB — VITAMIN D 25 HYDROXY (VIT D DEFICIENCY, FRACTURES): VITD: 34.84 ng/mL (ref 30.00–100.00)

## 2018-04-29 LAB — TSH: TSH: 1.02 u[IU]/mL (ref 0.35–4.50)

## 2018-04-29 NOTE — Progress Notes (Signed)
Patient ID: Kelsey [name], female   DOB: 1976/04/20, 42 y.o.   MRN: [id number]    HPI  Kelsey [name] is a 42 y.o.-year-old female, referred by her PCP, Dr. Birdie [name], for management of hypothyroidism.  Pt. has been dx with hypothyroidism in 2017 by Kelsey [name] (fatigue, mental fog) >> on NP thyroid 90 mg daily.  Reviewing the labs from then, her TSH was perfect before starting NP thyroid, at 1.15 and the free T4 was also normal...  She takes the thyroid hormone: - fasting, 7:30 am - with water - separated by >30 min from b'fast  - no calcium, iron, PPIs, multivitamins   - + Biotin - last dose 1-2 weeks ago  She is taking many supplements including chlorella, milk thistle, selenium 400 mcg daily, The [employer] One - MVI (with biotin - 200 mcg, Se 100 mcg). She is also on Testosterone (cream >> 2019: pellets). She was also on Hydrocortisone for 3-4 mo in 2017.  I reviewed pt's thyroid tests: 02/11/2018: TSH 0.377 (0.45-5.33), free T4 0.8 04/16/2016: TSH 1.15 Lab Results  Component Value Date   TSH 0.80 05/13/2012   TSH 1.17 06/25/2010   TSH 1.12 10/03/2008   Antithyroid antibodies: 02/11/2018: TPO Abs <0.3, ATA Abs <1.8 10/10/019: TPO Abs 8 (<34) No results found for: THGAB No components found for: TPOAB  Pt describes: - + fatigue. 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 - + weight gain - h/o bulimia  - in remission for 2 years - + Joint pains in knees, hips, fingers, neck, shoulders - prev. on Mobic by Dr. Birdie [name], now on Turmeric. Saw rheumatology - + RF (beginning of RA) - + Cold intolerance - No anxiety or depression -+ Constipation, diarrhea, heartburn - No dry skin - No hair loss  Pt denies: - feeling nodules in neck - dysphagia - choking - SOB with lying down But does complain of hoarseness  She has + FH of thyroid disorders in: mother. No FH of thyroid cancer.  No h/o radiation tx to head or  neck. + enlarged lf nodes since a child >> investig. Negative.  No recent use of iodine supplements.  Meals: - Breakfast: acai bowl or fruit/veggies mostly - Lunch: Salad/protein, fruit - Dinner: Protein, vegetables, fruit - Snacks: 2 a day: Dehydrated fruit, vegetable chips, chicarones  Pt. also has a history of well controlled asthma. Brother passed away by long QT at 73 y/o. She was investig. For this >> negative w/o.   She has 2 children of 78 and 47 years old (04/2018) and was a single mom for several years, now she is engaged.  ROS: Constitutional: + See HPI Eyes: no blurry vision, no xerophthalmia ENT: no sore throat, + see HPI Cardiovascular: no CP/SOB/palpitations/leg swelling Respiratory: no cough/SOB Gastrointestinal: + See HPI Musculoskeletal: + Both muscle/joint aches Skin: no rashes, + easy bruising Neurological: no tremors/numbness/tingling/dizziness, + headache Psychiatric: no depression/anxiety + Low libido  Past Medical History:  Diagnosis Date  . Abnormal Pap smear   . Anemia   . Bipolar disorder ([place])   . Chronic sinusitis   . Headache(784.0)   . Pyelonephritis 2008   Pylo and stones in the past  . Sinus problem    sinusitis   Past Surgical History:  Procedure Laterality Date  . CESAREAN SECTION    . CESAREAN SECTION  08/31/2011   Procedure: CESAREAN SECTION;  Surgeon: [name], MD;  Location:  [facility];  Service: Gynecology;  Laterality: [name];  Repeat cesarean section with delivery of baby   . COLPOSCOPY    . KNEE ARTHROSCOPY     right knee  . WISDOM TOOTH EXTRACTION     Social History   Socioeconomic History  . Marital status: Married    Spouse name: Not on file  . Number of children: 2  . Years of education: Not on file  . Highest education level: Not on file  Occupational History  . Occupation:  Human resources officer       Social Needs  . Financial resource strain: Not on file  . Food insecurity:    Worry: Not on file    Inability: Not on  file  . Transportation needs:    Medical: Not on file    Non-medical: Not on file  Tobacco Use  . Smoking status: Never Smoker  . Smokeless tobacco: Never Used  Substance and Sexual Activity  . Alcohol use: No  . Drug use: No  . Sexual activity: Yes  Lifestyle  . Physical activity:    Days per week: Not on file    Minutes per session: Not on file  Relationships  Social History Narrative   1 daughter--[name] born 12/09, 1 step son   Current Outpatient Medications on File Prior to Visit  Medication Sig Dispense Refill  . Ascorbic Acid (VITAMIN C) 1000 MG tablet     . Cholecalciferol (VITAMIN D PO) Take 5,000 mg by mouth 2 (two) times daily.    . folic acid (FOLVITE) 0.5 MG tablet Take 0.5 mg by mouth daily.    . Garlic 400 MG CAPS     . levonorgestrel (MIRENA) 20 MCG/24HR IUD 1 each by Intrauterine route once.    . Milk Thistle 250 MG CAPS     . Misc Natural Products (CHLORELLA) 500 MG CAPS     . Selenium 200 MCG CAPS     . thyroid (ARMOUR) 60 MG tablet Take 60 mg by mouth daily before breakfast.    . valACYclovir (VALTREX) 500 MG tablet Take 500 mg by mouth daily. Reported on 09/18/2015    . vitamin E 400 UNIT capsule     . meloxicam (MOBIC) 15 MG tablet Take 1 tablet (15 mg total) by mouth daily. (Patient not taking: Reported on 04/29/2018) 30 tablet 1   No current facility-administered medications on file prior to visit.   She has an IUD.  Allergies  Allergen Reactions  . Morphine And Related Hives  . Sulfa Antibiotics Nausea And Vomiting  . Oseltamivir Nausea And Vomiting   Family History  Problem Relation Age of Onset  . Heart disease Brother        sudden cardiac death  . Asthma Brother   . Heart disease Maternal Grandfather 49       MI  . Coronary artery disease Maternal Grandfather   . Diabetes Maternal Grandfather   . Kidney disease Maternal Grandfather   . Cancer Neg Hx   . Stroke Neg Hx   . Hypertension Neg Hx   Also, diabetes, HTN, HL, heart  disease, hypothyroidism in mother.  PE: BP 128/80   Pulse 84   Ht 5\' 3"  (1.6 m) Comment: measured  Wt 154 lb (69.9 kg)   SpO2 98%   BMI 27.28 kg/m  Wt Readings from Last 3 Encounters:  04/29/18 154 lb (69.9 kg)  03/23/18 155 lb 2 oz (70.4 kg)  02/12/18 149 lb 8 oz (67.8 kg)  Constitutional: Normal weight, in NAD Eyes: PERRLA, EOMI, no exophthalmos ENT: moist mucous membranes, no thyromegaly, no cervical lymphadenopathy Cardiovascular: RRR, No MRG Respiratory: CTA B Gastrointestinal: abdomen soft, NT, ND, BS+ Musculoskeletal: no deformities, strength intact in all 4 Skin: moist, warm, no rashes Neurological: no tremor with outstretched hands, DTR normal in all 4  ASSESSMENT: 1. Hypothyroidism - No evidence of autoimmunity per antibody titers from 02/2018 and 04/2016  2.  Fatigue  PLAN:  1. Patient with questionable history of hypothyroidism, on desiccated thyroid extract (NP thyroid 90 mg daily).  She mentions that she was diagnosed with hypothyroidism by an integrative medicine provider and she was started on thyroid hormones in 2017.  Reviewing the labs from that visit, in 04/2016 TSH and free T4 were normal, so I believe she was started on thyroid hormones just based on her symptoms.  I explained that this is risky practice, since it will render her own thyroid inactive and dependent on exogenous thyroid hormones.  This is not an ideal situation.  Indeed, at last check, his TSH was low, and we will check again today to see if we can start decreasing her supplement but we need to be careful since she may become symptomatic when we try to decrease the dose.  We will need to do this very slowly to allow thyroid time to recover.  I explained that she may not recover her thyroid function and may need to be on NP thyroid from now on, but I am hoping we can at least decrease the dose. - she appears euthyroid but complains of fatigue as mentioned above. - she does not appear to have a  goiter, thyroid nodules, or neck compression symptoms - We discussed about correct intake of levothyroxine, fasting, with water, separated by at least 30 minutes from breakfast, and separated by more than 4 hours from calcium, iron, multivitamins, acid reflux medications (PPIs).  She is not taking it correctly: She takes multivitamins along with NP thyroid.  I explained that these need to be moved at least 4 hours after thyroid hormones.  I suspect that she will need a decreasing dose after we moved to multivitamins later in the day. - will check thyroid tests today: TSH, free T3, free T4 - If labs today are abnormal, she will need to return in ~6 weeks for repeat labs - Otherwise, I will see her back in 4 months  2.  Fatigue -Patient is on quite a few supplements and she is telling me that she was diagnosed with high asbestos, although reviewing her labs, he has best this level was at the lower normal range, so I am not quite sure how this diagnosis was made.  Regardless, she was started on several chelators which did not make a difference in how she felt.  At this visit, I advised her to stop them.  She was also on hydrocortisone in the past for a diagnosis of adrenal insufficiency/fatigue, however, reviewing her tests she had fasting, a.m., cortisol levels that were well into the normal range at the time of the diagnosis.  There was no cosyntropin stimulation test performed or any other study to confirm adrenal insufficiency.  I am glad that she is now off the corticosteroid, as otherwise, she would probably have developed clinical adrenal insufficiency.  At next visit, though, if she does not feel better, we may need to check a cosyntropin stimulation test.  In the setting of weight gain, my pretest probability is low, however. -As  mentioned above, I advised her to stop all of her supplements for now, except for vitamin D - at today's visit we will check a vitamin D and since she also complains of some  GI symptoms, I will check a celiac panel  CC:  Dr. Larina Earthly -OB/GYN [facility] - [facility] - In  Component     Latest Ref Rng & Units 04/29/2018  Immunoglobulin A     47 - 310 mg/dL 160  (tTG) Ab, IgG     U/mL 2  (tTG) Ab, IgA     U/mL 1  Gliadin IgA     Units 17  Deamidated Gliadin Abs, IgG     Units 2  T4,Free(Direct)     0.60 - 1.60 ng/dL 0.79  Triiodothyronine,Free,Serum     2.3 - 4.2 pg/mL 4.1  TSH     0.35 - 4.50 uIU/mL 1.02  VITD     30.00 - 100.00 ng/mL 34.84    Labs are normal. No sign of Hashimoto's thyroiditis. It does seem that the pt developed hypothyroidism and depends of thyroid hh replacement but will d/w her to see if she wants to try to alternate NP 60 with 90 mg qod.  Philemon Kingdom, MD PhD [facility] Endocrinology

## 2018-04-29 NOTE — Patient Instructions (Addendum)
Please continue NP thyroid 90 mg daily.  Take the thyroid hormone every day, with water, at least 30 minutes before breakfast, separated by at least 4 hours from: - acid reflux medications - calcium - iron - multivitamins  Stop all supplements except vitamin D 5000 units 1 tablet a day.  Please stop at the lab.  Please come back for a follow-up appointment in 4 months.  Please watch "[employer] over [employer]".  Read the following books: [place] 2 diet Dr. Alden Benjamin - How Not to Die

## 2018-04-30 DIAGNOSIS — Z0289 Encounter for other administrative examinations: Secondary | ICD-10-CM

## 2018-05-01 LAB — CELIAC DISEASE PANEL
(tTG) Ab, IgA: 1 U/mL
(tTG) Ab, IgG: 2 U/mL
Gliadin IgA: 17 Units
Gliadin IgG: 2 Units
Immunoglobulin A: 160 mg/dL (ref 47–310)

## 2018-05-02 ENCOUNTER — Encounter: Payer: Self-pay | Admitting: Internal Medicine

## 2018-05-05 ENCOUNTER — Other Ambulatory Visit: Payer: Self-pay | Admitting: Internal Medicine

## 2018-05-05 DIAGNOSIS — E039 Hypothyroidism, unspecified: Secondary | ICD-10-CM

## 2018-05-26 ENCOUNTER — Encounter: Payer: Self-pay | Admitting: Internal Medicine

## 2018-05-26 ENCOUNTER — Other Ambulatory Visit: Payer: Self-pay | Admitting: Internal Medicine

## 2018-05-26 DIAGNOSIS — E039 Hypothyroidism, unspecified: Secondary | ICD-10-CM

## 2018-05-26 MED ORDER — NP THYROID 90 MG PO TABS: 90.0000 mg | ORAL_TABLET | ORAL | 3 refills | Status: DC

## 2018-05-26 MED ORDER — NP THYROID 60 MG PO TABS: 60.0000 mg | ORAL_TABLET | ORAL | 3 refills | Status: DC

## 2018-06-01 ENCOUNTER — Encounter: Payer: BLUE CROSS/BLUE SHIELD | Attending: Internal Medicine | Admitting: Dietician

## 2018-06-01 DIAGNOSIS — Z713 Dietary counseling and surveillance: Secondary | ICD-10-CM | POA: Insufficient documentation

## 2018-06-01 DIAGNOSIS — E559 Vitamin D deficiency, unspecified: Secondary | ICD-10-CM

## 2018-06-01 DIAGNOSIS — E039 Hypothyroidism, unspecified: Secondary | ICD-10-CM | POA: Insufficient documentation

## 2018-06-01 DIAGNOSIS — F502 Bulimia nervosa: Secondary | ICD-10-CM

## 2018-06-01 NOTE — Patient Instructions (Addendum)
Mindfulness  Eating slowly  Stopping when satisfied Eat for nourishment avoid skipping meals Include a protein source at each meal (legumes, nuts, seeds) See the plant based resource sheet

## 2018-06-01 NOTE — Progress Notes (Signed)
  Medical Nutrition Therapy:  Appt start time: 0830 end time:  0920.  Assessment:  Primary concerns today: Patient is here today alone to learn more about plant based eating to reduce inflammation.  History includes hypothyroidism, a positive RA factor, constipation, diarrhea, and heartburn. History of bulimia ages 28 (parent's divorce, perfectionistic ideals)- release with purging but did not binge.  Has followed an AIP diet and Weight Watchers in the past. Currently she is trying to eat more plant based.  Has used [employer], [employer].  She has gone to integrative medicine doctors and was on a lot of supplements which she has since stopped.  She complains of poor energy which has not improved since seeing the integrative medicine doctors.  She has had blood allergy testing for food which has improved over the past 2 years but continued to show sensitivities to dairy and eggs.  Discussed false positives and negatives with this test.  She also was told that she had high levels of arsonic as well.    Divorce 2016. 120 lbs 2 1/2 years ago. 155 lbs today She would like to lose 10 lbs.   Patient lives with her 28 and 66 yo children.  She is engaged to be married in April.  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  She has a MSW degree and did this about 10 years. Currently is doing yoga.  Preferred Learning Style:   No preference indicated   Learning Readiness:   Ready  Change in progress   MEDICATIONS: See list to include vitamin D   DIETARY INTAKE:   24-hr recall:  B ( AM): Acai bowl (frozen fruit, acai powder, almond milk, honey)   Snk ( AM): dehydrated fruit chips occasional L ( PM): salad, beans, tofu OR vegetarian soup OR leftovers Snk ( PM): dehydrated fruit chips or vege chips, apple with peanut butter D ( PM): sweet potato noodles stirf ried with peanut butter sauce OR cauliflower tacos, fresh fruit OR pork roast or baked  chicken, broccoli, salad, fresh fruit OR out to eat ([employer]) Snk ( PM):  Beverages: water  Usual physical activity: yoga 2-3 times per week  Progress Towards Goal(s):  In progress.   Nutritional Diagnosis:  NB-1.1 Food and nutrition-related knowledge deficit As related to plant based eating.  As evidenced by patient report.    Intervention:  Nutrition counseling/education related to plant based eating.  Discussed foods related to inflammation.  Discussed resources, necessary nutrients and how to get them, meal ideas and basic meal planning.  Discussed mindfulness, importance of nourishing her body and to work on a positive body image.  Mindfulness  Eating slowly  Stopping when satisfied Eat for nourishment avoid skipping meals Include a protein source at each meal (legumes, nuts, seeds) See the plant based resource sheet  Teaching Method Utilized:  Visual Auditory  Handouts given during visit include:  Plant based primer  Barriers to learning/adherence to lifestyle change: time  Demonstrated degree of understanding via:  Teach Back   Monitoring/Evaluation:  Dietary intake, exercise, and body weight prn.

## 2018-06-02 ENCOUNTER — Encounter

## 2018-06-10 DIAGNOSIS — E039 Hypothyroidism, unspecified: Secondary | ICD-10-CM | POA: Diagnosis not present

## 2018-06-10 DIAGNOSIS — R5382 Chronic fatigue, unspecified: Secondary | ICD-10-CM | POA: Diagnosis not present

## 2018-06-10 DIAGNOSIS — R198 Other specified symptoms and signs involving the digestive system and abdomen: Secondary | ICD-10-CM | POA: Diagnosis not present

## 2018-06-10 DIAGNOSIS — M1990 Unspecified osteoarthritis, unspecified site: Secondary | ICD-10-CM | POA: Diagnosis not present

## 2018-06-18 ENCOUNTER — Other Ambulatory Visit: Payer: Self-pay | Admitting: Family Medicine

## 2018-06-18 MED ORDER — BETAMETHASONE VALERATE 0.12 % EX FOAM: 1.0000 "application " | Freq: Every day | CUTANEOUS | 6 refills | Status: DC

## 2018-06-18 NOTE — Telephone Encounter (Signed)
Copied from [place] [id number]. Topic: Quick Communication - Rx Refill/Question >> Jun 18, 2018 12:00 PM Alanda Slim E wrote: Medication: Betamethasone Valerate 0.12 % foam ( for her Scalp Psoriasis flair up)   Has the patient contacted their pharmacy? No.  Preferred Pharmacy (with phone number or street name): [id number] - [place], [place] - [address] AT [place] [phone] (Phone) [phone] (Fax)    Agent: Please be advised that RX refills may take up to 3 business days. We ask that you follow-up with your pharmacy.

## 2018-06-18 NOTE — Telephone Encounter (Signed)
Pt was last seen 03/2018. Ok to send to pharmacy.

## 2018-06-18 NOTE — Telephone Encounter (Signed)
Medication filled to pharmacy as requested.   

## 2018-06-18 NOTE — Telephone Encounter (Signed)
Ok to refill 

## 2018-07-07 DIAGNOSIS — R5382 Chronic fatigue, unspecified: Secondary | ICD-10-CM | POA: Diagnosis not present

## 2018-07-07 DIAGNOSIS — Z77010 Contact with and (suspected) exposure to arsenic: Secondary | ICD-10-CM | POA: Diagnosis not present

## 2018-07-07 DIAGNOSIS — E039 Hypothyroidism, unspecified: Secondary | ICD-10-CM | POA: Diagnosis not present

## 2018-07-10 ENCOUNTER — Other Ambulatory Visit (INDEPENDENT_AMBULATORY_CARE_PROVIDER_SITE_OTHER): Payer: BLUE CROSS/BLUE SHIELD

## 2018-07-10 ENCOUNTER — Other Ambulatory Visit: Payer: BLUE CROSS/BLUE SHIELD

## 2018-07-10 DIAGNOSIS — E039 Hypothyroidism, unspecified: Secondary | ICD-10-CM

## 2018-07-10 NOTE — Addendum Note (Signed)
Addended by: Caffie Pinto on: 07/10/2018 03:00 PM   Modules accepted: Orders

## 2018-07-11 LAB — T4, FREE: Free T4: 1.1 ng/dL (ref 0.8–1.8)

## 2018-07-11 LAB — TSH: TSH: 0.37 mIU/L — ABNORMAL LOW

## 2018-07-11 LAB — T3, FREE: T3, Free: 3.6 pg/mL (ref 2.3–4.2)

## 2018-07-13 ENCOUNTER — Other Ambulatory Visit: Payer: Self-pay | Admitting: Internal Medicine

## 2018-07-13 DIAGNOSIS — E039 Hypothyroidism, unspecified: Secondary | ICD-10-CM

## 2018-07-13 MED ORDER — NP THYROID 60 MG PO TABS: 60.0000 mg | ORAL_TABLET | Freq: Every day | ORAL | 3 refills | Status: DC

## 2018-08-07 ENCOUNTER — Ambulatory Visit: Payer: Self-pay | Admitting: *Deleted

## 2018-08-07 ENCOUNTER — Encounter: Payer: Self-pay | Admitting: Family Medicine

## 2018-08-07 ENCOUNTER — Ambulatory Visit: Payer: BLUE CROSS/BLUE SHIELD | Admitting: Family Medicine

## 2018-08-07 ENCOUNTER — Other Ambulatory Visit: Payer: Self-pay

## 2018-08-07 VITALS — BP 112/76 | HR 97 | Temp 98.3°F | Resp 15 | Ht 63.0 in | Wt 154.6 lb

## 2018-08-07 DIAGNOSIS — N200 Calculus of kidney: Secondary | ICD-10-CM

## 2018-08-07 DIAGNOSIS — M545 Low back pain, unspecified: Secondary | ICD-10-CM

## 2018-08-07 LAB — POCT URINALYSIS DIPSTICK
Bilirubin, UA: NEGATIVE
Blood, UA: NEGATIVE
Glucose, UA: NEGATIVE
Ketones, UA: NEGATIVE
Leukocytes, UA: NEGATIVE
Nitrite, UA: NEGATIVE
Protein, UA: NEGATIVE
Spec Grav, UA: 1.01 (ref 1.010–1.025)
Urobilinogen, UA: 0.2 E.U./dL
pH, UA: 7 (ref 5.0–8.0)

## 2018-08-07 MED ORDER — ONDANSETRON HCL 4 MG PO TABS: 4.0000 mg | ORAL_TABLET | Freq: Three times a day (TID) | ORAL | 0 refills | Status: DC | PRN

## 2018-08-07 MED ORDER — IBUPROFEN 800 MG PO TABS: 800.0000 mg | ORAL_TABLET | Freq: Three times a day (TID) | ORAL | 0 refills | Status: DC | PRN

## 2018-08-07 MED ORDER — OXYCODONE HCL 5 MG PO TABS: 5.0000 mg | ORAL_TABLET | Freq: Four times a day (QID) | ORAL | 0 refills | Status: DC | PRN

## 2018-08-07 MED ORDER — TAMSULOSIN HCL 0.4 MG PO CAPS: 0.4000 mg | ORAL_CAPSULE | Freq: Every day | ORAL | 3 refills | Status: DC

## 2018-08-07 NOTE — Progress Notes (Signed)
   Subjective:    Patient ID: Kelsey [name], female    DOB: 03-Feb-1976, 43 y.o.   MRN: [id number]  HPI L flank pain- 'I think i'm getting another kidney stone'.  sxs started 1 week ago w/ back pain.  Yesterday was having L sided cramping, 'like contractions all day long'.  Again having back pain at base of ribs on L.  Has increased water intake.  Last 2 days has had nausea.  No vomiting.  No hematuria.  CT in 2017 showed presence of 3 stones- 2 on L, 1 on R.  Never saw urology after 2017.     Review of Systems For ROS see HPI     Objective:   Physical Exam Vitals signs reviewed.  Constitutional:      Appearance: Normal appearance.     Comments: Obviously uncomfortable  Abdominal:     General: There is no distension.     Tenderness: There is abdominal tenderness (TTP over L flank and into LLQ). There is no guarding.  Musculoskeletal:        General: Tenderness (TTP over L lower back) present.  Skin:    General: Skin is warm and dry.  Neurological:     Mental Status: She is alert.  Psychiatric:        Mood and Affect: Mood normal.        Behavior: Behavior normal.        Thought Content: Thought content normal.           Assessment & Plan:  Renal calculi- new to provider, pt has hx of similar.  Last CT reviewed that showed 3 stones at that time and pt is only aware of passing 1.  Today's sxs and PE highly suspicious for stone.  Start NSAIDs, pain meds, Zofran, Flomax, and place urgent referral to Urology.  Reviewed supportive care and red flags that should prompt return.  Pt expressed understanding and is in agreement w/ plan.

## 2018-08-07 NOTE — Telephone Encounter (Signed)
Contacted pt regarding symptoms; the pt says that it started on 07/30/2018 as low back pain; 08/06/2018 at 0500 she had waves of abdominal cramping (like gas); this morning the pain has returned to her lower back; the pt rates her pain as 6 out of 10; due to recommendations the pt has not taken any OTCs, but heat to the area has helped; recommendations made per nurse triage protocol; the pt would like to have Dr Virgil Benedict latest appointment today; pt offered and accepted appointment with Dr Henreitta Cea, [facility] Salvage, 08/07/2018 at 1515; she verbalized understanding; will route to office for notification.  Reason for Disposition . MODERATE pain (e.g., interferes with normal activities or awakens from sleep)  Answer Assessment - Initial Assessment Questions 1. ONSET: "When did the pain begin?"      07/30/2018 2. LOCATION: "Where does it hurt?" (upper, mid or lower back)     Left lower back 3. SEVERITY: "How bad is the pain?"  (e.g., Scale 1-10; mild, moderate, or severe)   - MILD (1-3): doesn't interfere with normal activities    - MODERATE (4-7): interferes with normal activities or awakens from sleep    - SEVERE (8-10): excruciating pain, unable to do any normal activities      Rated 6 out of 10 4. PATTERN: "Is the pain constant?" (e.g., yes, no; constant, intermittent)      constant 5. RADIATION: "Does the pain shoot into your legs or elsewhere?"     thighes and knees; thinks it is due to generalized pain 6. CAUSE:  "What do you think is causing the back pain?"      Kidney stone  7. BACK OVERUSE:  "Any recent lifting of heavy objects, strenuous work or exercise?"     no 8. MEDICATIONS: "What have you taken so far for the pain?" (e.g., nothing, acetaminophen, NSAIDS)     no 9. NEUROLOGIC SYMPTOMS: "Do you have any weakness, numbness, or problems with bowel/bladder control?"     no 10. OTHER SYMPTOMS: "Do you have any other symptoms?" (e.g., fever, abdominal pain, burning with urination,  blood in urine)       Waves of abdominal cramping 11. PREGNANCY: "Is there any chance you are pregnant?" (e.g., yes, no; LMP)       No mirena  Answer Assessment - Initial Assessment Questions 1. LOCATION: "Where does it hurt?" (e.g., left, right)     Left lower back 2. ONSET: "When did the pain start?"     07/30/2018; worsened 08/06/2018 3. SEVERITY: "How bad is the pain?" (e.g., Scale 1-10; mild, moderate, or severe)   - MILD (1-3): doesn't interfere with normal activities    - MODERATE (4-7): interferes with normal activities or awakens from sleep    - SEVERE (8-10): excruciating pain and patient unable to do normal activities (stays in bed)       Rated 6 out of 10 4. PATTERN: "Does the pain come and go, or is it constant?"      constant 5. CAUSE: "What do you think is causing the pain?"     Kidney stone 6. OTHER SYMPTOMS:  "Do you have any other symptoms?" (e.g., fever, abdominal pain, vomiting, leg weakness, burning with urination, blood in urine)     Waves of abdominal cramping 7. PREGNANCY:  "Is there any chance you are pregnant?" "When was your last menstrual period?"     No mirena  Protocols used: FLANK PAIN-A-AH, BACK PAIN-A-AH

## 2018-08-07 NOTE — Patient Instructions (Signed)
Follow up as needed or as scheduled START the Flomax once nightly to help possibly pass the stone Drink LOTS of fluids USE the oxycodone as needed for severe pain TAKE the Ibuprofen 3x/day- w/ food- for pain USE the Ondansetron (Zofran) as needed for nausea/vomiting We'll call you with your nephrology appt Call with any questions or concerns If symptoms worsen, please go to the [place] in there!

## 2018-08-09 LAB — URINE CULTURE
MICRO NUMBER:: 135052
Result:: NO GROWTH
SPECIMEN QUALITY:: ADEQUATE

## 2018-08-11 DIAGNOSIS — N200 Calculus of kidney: Secondary | ICD-10-CM | POA: Diagnosis not present

## 2018-09-02 ENCOUNTER — Ambulatory Visit: Payer: BLUE CROSS/BLUE SHIELD | Admitting: Internal Medicine

## 2018-09-14 DIAGNOSIS — R5382 Chronic fatigue, unspecified: Secondary | ICD-10-CM | POA: Diagnosis not present

## 2018-09-14 DIAGNOSIS — E039 Hypothyroidism, unspecified: Secondary | ICD-10-CM | POA: Diagnosis not present

## 2018-09-14 DIAGNOSIS — E559 Vitamin D deficiency, unspecified: Secondary | ICD-10-CM | POA: Diagnosis not present

## 2018-09-21 ENCOUNTER — Encounter: Payer: BLUE CROSS/BLUE SHIELD | Admitting: Family Medicine

## 2018-09-21 DIAGNOSIS — E559 Vitamin D deficiency, unspecified: Secondary | ICD-10-CM | POA: Diagnosis not present

## 2018-09-21 DIAGNOSIS — R5382 Chronic fatigue, unspecified: Secondary | ICD-10-CM | POA: Diagnosis not present

## 2018-09-21 DIAGNOSIS — E039 Hypothyroidism, unspecified: Secondary | ICD-10-CM | POA: Diagnosis not present

## 2018-10-05 ENCOUNTER — Encounter: Payer: Self-pay | Admitting: Family Medicine

## 2018-10-05 ENCOUNTER — Other Ambulatory Visit: Payer: Self-pay

## 2018-10-05 ENCOUNTER — Ambulatory Visit (INDEPENDENT_AMBULATORY_CARE_PROVIDER_SITE_OTHER): Payer: BLUE CROSS/BLUE SHIELD | Admitting: Family Medicine

## 2018-10-05 VITALS — BP 110/78 | HR 89 | Temp 98.6°F | Resp 14 | Ht 63.0 in | Wt 154.0 lb

## 2018-10-05 DIAGNOSIS — L03113 Cellulitis of right upper limb: Secondary | ICD-10-CM

## 2018-10-05 MED ORDER — CEPHALEXIN 500 MG PO CAPS: 500.0000 mg | ORAL_CAPSULE | Freq: Three times a day (TID) | ORAL | 0 refills | Status: AC

## 2018-10-05 NOTE — Progress Notes (Signed)
   Subjective:    Patient ID: Kelsey [name], female    DOB: [birth year], 43 y.o.   MRN: [id number]  HPI Infected thorn stick- pt was trimming rose bushes on Thursday.  Pt got stuck on back of R hand- through her gloves.  Said area became red, warm, and swollen.  Pain is now extending up R arm. Swelling has improved.  Had some streaking redness on back of hand.  UTD on Tdap.  Took left over diflucan 'in case it was fungal'   Review of Systems For ROS see HPI     Objective:   Physical Exam Vitals signs reviewed.  Constitutional:      General: She is not in acute distress.    Appearance: Normal appearance. She is not ill-appearing.  HENT:     Head: Normocephalic and atraumatic.  Cardiovascular:     Pulses: Normal pulses.  Skin:    General: Skin is warm and dry.     Findings: Erythema (small area of erythema on dorsum of R hand at 3rd MCP joint, no streaking redness) present.  Neurological:     General: No focal deficit present.     Mental Status: She is alert and oriented to person, place, and time.  Psychiatric:        Mood and Affect: Mood normal.        Behavior: Behavior normal.        Thought Content: Thought content normal.           Assessment & Plan:  Cellulitis of hand- start Keflex 3x/day.  No evidence of lymphangitic streaking.  No fever.  Reviewed supportive care and red flags that should prompt return.  Pt expressed understanding and is in agreement w/ plan.

## 2018-10-05 NOTE — Patient Instructions (Signed)
Follow up as needed or as scheduled START the Cephalexin 3x/day x7 days- take w/ food Watch for spreading redness, fever, or other changes Call with any questions or concerns Hang in there!!

## 2018-11-12 DIAGNOSIS — F4321 Adjustment disorder with depressed mood: Secondary | ICD-10-CM | POA: Diagnosis not present

## 2018-12-02 DIAGNOSIS — F4322 Adjustment disorder with anxiety: Secondary | ICD-10-CM | POA: Diagnosis not present

## 2018-12-04 ENCOUNTER — Encounter: Payer: BLUE CROSS/BLUE SHIELD | Admitting: Family Medicine

## 2018-12-14 DIAGNOSIS — K5901 Slow transit constipation: Secondary | ICD-10-CM | POA: Diagnosis not present

## 2018-12-14 DIAGNOSIS — E559 Vitamin D deficiency, unspecified: Secondary | ICD-10-CM | POA: Diagnosis not present

## 2018-12-14 DIAGNOSIS — E039 Hypothyroidism, unspecified: Secondary | ICD-10-CM | POA: Diagnosis not present

## 2018-12-14 DIAGNOSIS — R5382 Chronic fatigue, unspecified: Secondary | ICD-10-CM | POA: Diagnosis not present

## 2018-12-28 ENCOUNTER — Other Ambulatory Visit: Payer: Self-pay | Admitting: Internal Medicine

## 2018-12-28 DIAGNOSIS — R5382 Chronic fatigue, unspecified: Secondary | ICD-10-CM | POA: Diagnosis not present

## 2018-12-28 DIAGNOSIS — E039 Hypothyroidism, unspecified: Secondary | ICD-10-CM | POA: Diagnosis not present

## 2018-12-28 DIAGNOSIS — E559 Vitamin D deficiency, unspecified: Secondary | ICD-10-CM | POA: Diagnosis not present

## 2019-01-05 ENCOUNTER — Encounter: Payer: Self-pay | Admitting: Physician Assistant

## 2019-01-05 ENCOUNTER — Other Ambulatory Visit: Payer: Self-pay

## 2019-01-05 ENCOUNTER — Ambulatory Visit (INDEPENDENT_AMBULATORY_CARE_PROVIDER_SITE_OTHER): Payer: BC Managed Care – PPO | Admitting: Physician Assistant

## 2019-01-05 ENCOUNTER — Ambulatory Visit: Payer: Self-pay | Admitting: *Deleted

## 2019-01-05 ENCOUNTER — Telehealth: Payer: Self-pay | Admitting: *Deleted

## 2019-01-05 VITALS — Temp 99.5°F | Ht 63.0 in | Wt 152.0 lb

## 2019-01-05 DIAGNOSIS — Z20828 Contact with and (suspected) exposure to other viral communicable diseases: Secondary | ICD-10-CM | POA: Diagnosis not present

## 2019-01-05 DIAGNOSIS — R6889 Other general symptoms and signs: Secondary | ICD-10-CM

## 2019-01-05 DIAGNOSIS — Z20822 Contact with and (suspected) exposure to covid-19: Secondary | ICD-10-CM

## 2019-01-05 NOTE — Telephone Encounter (Signed)
Spoke with patient, scheduled her for testing tomorrow at [facility] at 10:30 am.  Testing protocol reviewed.

## 2019-01-05 NOTE — Progress Notes (Signed)
I have discussed the procedure for the virtual visit with the patient who has given consent to proceed with assessment and treatment.   Larayne Baxley S Lousie Calico, CMA     

## 2019-01-05 NOTE — Telephone Encounter (Signed)
Pt called stating that on 01/05/2019 at 1400 her temp was 104.0 but she was outside waiting on a client; she sat in the car for app 10 minutes with the [facility] on and it was 101.0; this morning it was 99.5 (tympanic thermometer); she wears a mask at work; the pt has been having body aches, headache, scratchy throat, and the back of her neck/head, and occasional cough; she denies SOB; the pt says that she has extreme fatigue since 01/03/2019; the pt got married on 12/17/2018; the pt says that she has hypothyroid and autoimmune tendencies; she tries to avoid tylenol and ibuprofen; recommendations made per nurse triage protocol; she verbalized understanding; pt transferred to [facility] for shceduling.  Reason for Disposition . Fever > 103 F (39.4 C)  Answer Assessment - Initial Assessment Questions 1. COVID-19 DIAGNOSIS: "Who made your Coronavirus (COVID-19) diagnosis?" "Was it confirmed by a positive lab test?" If not diagnosed by a HCP, ask "Are there lots of cases (community spread) where you live?" (See public health department website, if unsure)     Community spread 2. ONSET: "When did the COVID-19 symptoms start?"     01/03/2019 3. WORST SYMPTOM: "What is your worst symptom?" (e.g., cough, fever, shortness of breath, muscle aches)    fever 4. COUGH: "Do you have a cough?" If so, ask: "How bad is the cough?"       occassional 5. FEVER: "Do you have a fever?" If so, ask: "What is your temperature, how was it measured, and when did it start?"   104.0 on 6/29 and 99.5 6/30 6. RESPIRATORY STATUS: "Describe your breathing?" (e.g., shortness of breath, wheezing, unable to speak)    no 7. BETTER-SAME-WORSE: "Are you getting better, staying the same or getting worse compared to yesterday?"  If getting worse, ask, "In what way?"     worse 8. HIGH RISK DISEASE: "Do you have any chronic medical problems?" (e.g., asthma, heart or lung disease, weak immune system, etc.)     asthma 9. PREGNANCY: "Is  there any chance you are pregnant?" "When was your last menstrual period?"     No IUD 10. OTHER SYMPTOMS: "Do you have any other symptoms?"  (e.g., chills, fatigue, headache, loss of smell or taste, muscle pain, sore throat)     Fatigue, headache, scratchy throat  Protocols used: CORONAVIRUS (COVID-19) DIAGNOSED OR SUSPECTED-A-AH

## 2019-01-05 NOTE — Telephone Encounter (Signed)
-----   Message from Leonidas Romberg, [place] sent at 01/05/2019  2:12 PM EDT -----  Name: Malaka Ruffner DOB: [birth year] MRN: [id number] Reason for testing: Suspected COVID-19 Insurance: [employer] Policy#: [id number]  Valid  Ordering Provider: Brunetta Jeans, PA-C

## 2019-01-05 NOTE — Progress Notes (Signed)
   Virtual Visit via Video   I connected with patient on 01/05/19 at 10:00 AM EDT by a video enabled telemedicine application and verified that I am speaking with the correct person using two identifiers.  Location patient: Home Location provider: [facility], Office Persons participating in the virtual visit: Patient, Provider, [place] (Patina Moore)  I discussed the limitations of evaluation and management by telemedicine and the availability of in person appointments. The patient expressed understanding and agreed to proceed.  Subjective:   HPI:   Patient presents via [facility] today c/o "flu-like" symptoms. Patient endorses fever with Tmax 104 (yesterday afternoon), scratchy throat, HA, pain in back of neck and shoulders x 1.5 days, also fatigue since Sunday.  Appetite is present but not hungery, eating bland. Denies change in taste and smell. Takes magnesium daily due to hypothyroidism. Has history of chronic looser caliber stool due to this but denies hematochezia, melena or any acute changes. Has history of asthma and allergies, taking Claritin PRN. Has noted mild cough but not sure if related to current issue of allergies.  ROS:   See pertinent positives and negatives per HPI.  Patient Active Problem List   Diagnosis Date Noted  . Rheumatoid factor positive 03/23/2018  . Acquired hypothyroidism 02/12/2018  . Asthma with allergic rhinitis 12/12/2014  . Vitamin D deficiency 12/12/2014  . Pain in the abdomen 11/02/2014  . Nevus of finger 09/14/2013  . Migraine, unspecified, without mention of intractable migraine without mention of status migrainosus 06/21/2013  . Bulimia nervosa 06/21/2013  . Routine general medical examination at a health care facility 05/13/2012  . Contact dermatitis 05/04/2012  . Bunion of great toe 12/26/2010  . 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 Allergic rhinitis 07/16/2010  . MYALGIA 06/25/2010  . VERTIGO 05/07/2010  . LOW BACK PAIN SYNDROME  03/02/2010  . NEOPLASM OF UNCERTAIN BEHAVIOR OF SKIN 10/03/2008    Social History   Tobacco Use  . Smoking status: Never Smoker  . Smokeless tobacco: Never Used  Substance Use Topics  . Alcohol use: No    Current Outpatient Medications:  .  Cholecalciferol (VITAMIN D PO), Take 5,000 mg by mouth 2 (two) times daily., Disp: , Rfl:  .  levonorgestrel (MIRENA) 20 MCG/24HR IUD, 1 each by Intrauterine route once., Disp: , Rfl:  .  thyroid (ARMOUR) 60 MG tablet, Take 1 tablet by mouth every other day., Disp: , Rfl:  .  valACYclovir (VALTREX) 500 MG tablet, , Disp: , Rfl:   Allergies  Allergen Reactions  . Morphine And Related Hives  . Sulfa Antibiotics Nausea And Vomiting  . Oseltamivir Nausea And Vomiting    Objective:   Temp 99.5 F (37.5 C) (Tympanic)   Ht 5\' 3"  (1.6 m)   Wt 152 lb (68.9 kg)   BMI 26.93 kg/m   Patient is well-developed, well-nourished in no acute distress.  Resting comfortably at home.  Head is normocephalic, atraumatic.  No labored breathing.  Speech is clear and coherent with logical contest.  Patient is alert and oriented at baseline.   Assessment and Plan:   1. Suspected Covid-19 Virus Infection Will send for COVID testing. Patient enrolled in [place] monitoring program. Supportive measures reviewed. She is to self-quarantine until test results are in at which time we will make further recommendations.  - MyChart COVID-19 home monitoring program; Future - Temperature monitoring; Future    Leeanne Rio, PA-C 01/05/2019

## 2019-01-06 ENCOUNTER — Other Ambulatory Visit: Payer: BC Managed Care – PPO

## 2019-01-06 DIAGNOSIS — Z20822 Contact with and (suspected) exposure to covid-19: Secondary | ICD-10-CM

## 2019-01-11 LAB — NOVEL CORONAVIRUS, NAA: SARS-CoV-2, NAA: NOT DETECTED

## 2019-02-18 ENCOUNTER — Encounter: Payer: Self-pay | Admitting: Family Medicine

## 2019-02-18 ENCOUNTER — Other Ambulatory Visit: Payer: Self-pay

## 2019-02-18 ENCOUNTER — Ambulatory Visit (INDEPENDENT_AMBULATORY_CARE_PROVIDER_SITE_OTHER): Payer: BC Managed Care – PPO | Admitting: Family Medicine

## 2019-02-18 VITALS — Temp 98.8°F | Ht 63.0 in | Wt 148.0 lb

## 2019-02-18 DIAGNOSIS — E039 Hypothyroidism, unspecified: Secondary | ICD-10-CM | POA: Diagnosis not present

## 2019-02-18 DIAGNOSIS — Z0000 Encounter for general adult medical examination without abnormal findings: Secondary | ICD-10-CM | POA: Diagnosis not present

## 2019-02-18 DIAGNOSIS — E559 Vitamin D deficiency, unspecified: Secondary | ICD-10-CM | POA: Diagnosis not present

## 2019-02-18 DIAGNOSIS — E663 Overweight: Secondary | ICD-10-CM

## 2019-02-18 NOTE — Assessment & Plan Note (Signed)
Pt has weaned off all thyroid medication.  Now complaining of hair loss.  Check labs.  Restart meds prn.

## 2019-02-18 NOTE — Assessment & Plan Note (Signed)
Check labs and replete prn. 

## 2019-02-18 NOTE — Progress Notes (Signed)
I have discussed the procedure for the virtual visit with the patient who has given consent to proceed with assessment and treatment.   Sherelle Castelli L Montrae Braithwaite, CMA     

## 2019-02-18 NOTE — Assessment & Plan Note (Signed)
Pt's video PE WNL.  UTD on GYN.  UTD on Tdap.  Check labs.  Anticipatory guidance provided.

## 2019-02-18 NOTE — Progress Notes (Signed)
   Virtual Visit via Video   I connected with patient on 02/18/19 at  3:30 PM EDT by a video enabled telemedicine application and verified that I am speaking with the correct person using two identifiers.  Location patient: Home Location provider: [facility]up[facility]ist, Office Persons participating in the virtual visit: Patient, Provider, [place] (Jess B)  I discussed the limitations of evaluation and management by telemedicine and the availability of in person appointments. The patient expressed understanding and agreed to proceed.  Subjective:   HPI:   CPE- UTD on pap, mammo (q2 yrs) w/ Dr Pamala Hurry.  ROS:  Patient reports no vision/ hearing changes, adenopathy,fever, weight change,  persistant/recurrent hoarseness , swallowing issues, chest pain, palpitations, edema, persistant/recurrent cough, hemoptysis, dyspnea (rest/exertional/paroxysmal nocturnal), gastrointestinal bleeding (melena, rectal bleeding), abdominal pain, significant heartburn, bowel changes, GU symptoms (dysuria, hematuria, incontinence), Gyn symptoms (abnormal  bleeding, pain),  syncope, focal weakness, memory loss, numbness & tingling, skin//nail changes, abnormal bruising or bleeding, anxiety, or depression.   + hair loss- stopped thyroid meds  Patient Active Problem List   Diagnosis Date Noted  . Rheumatoid factor positive 03/23/2018  . Acquired hypothyroidism 02/12/2018  . Asthma with allergic rhinitis 12/12/2014  . Vitamin D deficiency 12/12/2014  . Pain in the abdomen 11/02/2014  . Nevus of finger 09/14/2013  . Migraine, unspecified, without mention of intractable migraine without mention of status migrainosus 06/21/2013  . Bulimia nervosa 06/21/2013  . Routine general medical examination at a health care facility 05/13/2012  . Contact dermatitis 05/04/2012  . Bunion of great toe 12/26/2010  . 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 Allergic rhinitis 07/16/2010  . MYALGIA 06/25/2010  . VERTIGO 05/07/2010  .  LOW BACK PAIN SYNDROME 03/02/2010  . NEOPLASM OF UNCERTAIN BEHAVIOR OF SKIN 10/03/2008    Social History   Tobacco Use  . Smoking status: Never Smoker  . Smokeless tobacco: Never Used  Substance Use Topics  . Alcohol use: No    Current Outpatient Medications:  .  Cholecalciferol (VITAMIN D3) 125 MCG (5000 UT) CHEW, Chew by mouth. Pt takes 2 daily, Disp: , Rfl:  .  Cyanocobalamin (VITAMIN B-12) 5000 MCG LOZG, Take by mouth. Pt takes 2 daily, Disp: , Rfl:  .  levonorgestrel (MIRENA) 20 MCG/24HR IUD, 1 each by Intrauterine route once., Disp: , Rfl:  .  thyroid (ARMOUR) 60 MG tablet, Take 1 tablet by mouth every other day., Disp: , Rfl:  .  valACYclovir (VALTREX) 500 MG tablet, , Disp: , Rfl:   Allergies  Allergen Reactions  . Morphine And Related Hives  . Sulfa Antibiotics Nausea And Vomiting  . Oseltamivir Nausea And Vomiting    Objective:   Temp 98.8 F (37.1 C) (Oral)   Ht 5\' 3"  (1.6 m)   Wt 148 lb (67.1 kg)   BMI 26.22 kg/m   AAOx3, NAD NCAT, EOMI No obvious CN deficits Coloring WNL Pt is able to speak clearly, coherently without shortness of breath or increased work of breathing.  Thought process is linear.  Mood is appropriate.   Assessment and Plan:   See problem based charting   Annye Asa, MD 02/18/2019

## 2019-02-23 ENCOUNTER — Other Ambulatory Visit (INDEPENDENT_AMBULATORY_CARE_PROVIDER_SITE_OTHER): Payer: BC Managed Care – PPO

## 2019-02-23 ENCOUNTER — Other Ambulatory Visit: Payer: Self-pay

## 2019-02-23 ENCOUNTER — Encounter: Payer: Self-pay | Admitting: Family Medicine

## 2019-02-23 DIAGNOSIS — E559 Vitamin D deficiency, unspecified: Secondary | ICD-10-CM | POA: Diagnosis not present

## 2019-02-23 DIAGNOSIS — E039 Hypothyroidism, unspecified: Secondary | ICD-10-CM | POA: Diagnosis not present

## 2019-02-23 DIAGNOSIS — E663 Overweight: Secondary | ICD-10-CM

## 2019-02-23 LAB — CBC WITH DIFFERENTIAL/PLATELET
Basophils Absolute: 0 10*3/uL (ref 0.0–0.1)
Basophils Relative: 0.8 % (ref 0.0–3.0)
Eosinophils Absolute: 0.1 10*3/uL (ref 0.0–0.7)
Eosinophils Relative: 2.4 % (ref 0.0–5.0)
HCT: 41.8 % (ref 36.0–46.0)
Hemoglobin: 14.3 g/dL (ref 12.0–15.0)
Lymphocytes Relative: 22.4 % (ref 12.0–46.0)
Lymphs Abs: 1.4 10*3/uL (ref 0.7–4.0)
MCHC: 34.1 g/dL (ref 30.0–36.0)
MCV: 91.5 fl (ref 78.0–100.0)
Monocytes Absolute: 0.6 10*3/uL (ref 0.1–1.0)
Monocytes Relative: 9 % (ref 3.0–12.0)
Neutro Abs: 4 10*3/uL (ref 1.4–7.7)
Neutrophils Relative %: 65.4 % (ref 43.0–77.0)
Platelets: 230 10*3/uL (ref 150.0–400.0)
RBC: 4.57 Mil/uL (ref 3.87–5.11)
RDW: 12.9 % (ref 11.5–15.5)
WBC: 6.2 10*3/uL (ref 4.0–10.5)

## 2019-02-23 LAB — HEPATIC FUNCTION PANEL
ALT: 13 U/L (ref 0–35)
AST: 13 U/L (ref 0–37)
Albumin: 4.4 g/dL (ref 3.5–5.2)
Alkaline Phosphatase: 53 U/L (ref 39–117)
Bilirubin, Direct: 0.1 mg/dL (ref 0.0–0.3)
Total Bilirubin: 0.5 mg/dL (ref 0.2–1.2)
Total Protein: 7.4 g/dL (ref 6.0–8.3)

## 2019-02-23 LAB — LIPID PANEL
Cholesterol: 177 mg/dL (ref 0–200)
HDL: 58.8 mg/dL (ref >39.00)
LDL Cholesterol: 106 mg/dL — ABNORMAL HIGH (ref 0–99)
NonHDL: 118.09
Total CHOL/HDL Ratio: 3
Triglycerides: 60 mg/dL (ref 0.0–149.0)
VLDL: 12 mg/dL (ref 0.0–40.0)

## 2019-02-23 LAB — BASIC METABOLIC PANEL
BUN: 10 mg/dL (ref 6–23)
CO2: 28 mEq/L (ref 19–32)
Calcium: 9.4 mg/dL (ref 8.4–10.5)
Chloride: 103 mEq/L (ref 96–112)
Creatinine, Ser: 0.76 mg/dL (ref 0.40–1.20)
GFR: 83.5 mL/min (ref >60.00)
Glucose, Bld: 84 mg/dL (ref 70–99)
Potassium: 4 mEq/L (ref 3.5–5.1)
Sodium: 139 mEq/L (ref 135–145)

## 2019-02-23 LAB — T4, FREE: Free T4: 0.98 ng/dL (ref 0.60–1.60)

## 2019-02-23 LAB — T3, FREE: T3, Free: 3.4 pg/mL (ref 2.3–4.2)

## 2019-02-23 LAB — TSH: TSH: 1.51 u[IU]/mL (ref 0.35–4.50)

## 2019-02-23 LAB — VITAMIN D 25 HYDROXY (VIT D DEFICIENCY, FRACTURES): VITD: 55.42 ng/mL (ref 30.00–100.00)

## 2019-03-04 ENCOUNTER — Encounter: Payer: Self-pay | Admitting: Family Medicine

## 2019-03-04 ENCOUNTER — Other Ambulatory Visit: Payer: Self-pay | Admitting: Family Medicine

## 2019-03-04 DIAGNOSIS — E039 Hypothyroidism, unspecified: Secondary | ICD-10-CM

## 2019-03-22 ENCOUNTER — Ambulatory Visit (INDEPENDENT_AMBULATORY_CARE_PROVIDER_SITE_OTHER): Payer: PRIVATE HEALTH INSURANCE

## 2019-03-22 ENCOUNTER — Other Ambulatory Visit: Payer: Self-pay

## 2019-03-22 DIAGNOSIS — E039 Hypothyroidism, unspecified: Secondary | ICD-10-CM | POA: Diagnosis not present

## 2019-03-22 LAB — TSH: TSH: 1.4 u[IU]/mL (ref 0.35–4.50)

## 2019-06-02 LAB — HM MAMMOGRAPHY

## 2019-06-09 ENCOUNTER — Encounter: Payer: Self-pay | Admitting: General Practice

## 2019-07-22 ENCOUNTER — Encounter: Payer: Self-pay | Admitting: Family Medicine

## 2019-07-23 MED ORDER — BETAMETHASONE VALERATE 0.12 % EX FOAM: 1.0000 "application " | Freq: Every day | CUTANEOUS | 6 refills | Status: DC

## 2020-01-24 ENCOUNTER — Telehealth: Payer: PRIVATE HEALTH INSURANCE | Admitting: Physician Assistant

## 2020-01-25 ENCOUNTER — Encounter: Payer: Self-pay | Admitting: Physician Assistant

## 2020-01-25 ENCOUNTER — Other Ambulatory Visit: Payer: Self-pay

## 2020-01-25 ENCOUNTER — Telehealth (INDEPENDENT_AMBULATORY_CARE_PROVIDER_SITE_OTHER): Payer: PRIVATE HEALTH INSURANCE | Admitting: Physician Assistant

## 2020-01-25 VITALS — Temp 100.0°F

## 2020-01-25 DIAGNOSIS — K2900 Acute gastritis without bleeding: Secondary | ICD-10-CM

## 2020-01-25 DIAGNOSIS — Z20822 Contact with and (suspected) exposure to covid-19: Secondary | ICD-10-CM | POA: Diagnosis not present

## 2020-01-25 MED ORDER — ONDANSETRON 8 MG PO TBDP: 8.0000 mg | ORAL_TABLET | Freq: Three times a day (TID) | ORAL | 0 refills | Status: DC | PRN

## 2020-01-25 MED ORDER — PANTOPRAZOLE SODIUM 40 MG PO TBEC: 40.0000 mg | DELAYED_RELEASE_TABLET | Freq: Every day | ORAL | 3 refills | Status: DC

## 2020-01-25 NOTE — Patient Instructions (Signed)
Instructions sent to MyChart

## 2020-01-25 NOTE — Progress Notes (Signed)
Virtual Visit via Video   I connected with patient on 01/25/20 at 11:30 AM EDT by a video enabled telemedicine application and verified that I am speaking with the correct person using two identifiers.  Location patient: Home Location provider: [facility], Office Persons participating in the virtual visit: Patient, Provider, [place] (Patina Moore)  I discussed the limitations of evaluation and management by telemedicine and the availability of in person appointments. The patient expressed understanding and agreed to proceed.  Subjective:   HPI:   Patient presents via [place] today with concerns for COVID-19. Notes her husband recently started having URI symptoms last week, was tested and found to be positive.  Patient notes she was tested at the same time but negative.  At that time was only dealing with some fatigue, worsening throughout last week. Notes last Saturday starting with fever, nausea, diarrhea, chills, headache, body aches.Has been taking Tylenol and Excedrin which have been keeping temperature and headaches under control. Notes she has had continued nausea with only one episode of emesis on Sunday -- non-bloody. Notes LUQ pain with eating associated with heartburn and indigestion. Has been hydrating with diet soda and ice chips. Denies SOB, chest congestion although notes a dry cough. Has noted loss of taste and smell.  Notes she had a repeat COVID test (send out) this AM at [facility] and was told there would be a 48-hour turn around.   ROS:   See pertinent positives and negatives per HPI.  Patient Active Problem List   Diagnosis Date Noted  . Rheumatoid factor positive 03/23/2018  . Acquired hypothyroidism 02/12/2018  . Asthma with allergic rhinitis 12/12/2014  . Vitamin D deficiency 12/12/2014  . Nevus of finger 09/14/2013  . Migraine, unspecified, without mention of intractable migraine without mention of status migrainosus 06/21/2013  . Bulimia nervosa  06/21/2013  . Routine general medical examination at a health care facility 05/13/2012  . Contact dermatitis 05/04/2012  . Bunion of great toe 12/26/2010  . 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 Allergic rhinitis 07/16/2010  . MYALGIA 06/25/2010  . VERTIGO 05/07/2010  . LOW BACK PAIN SYNDROME 03/02/2010  . NEOPLASM OF UNCERTAIN BEHAVIOR OF SKIN 10/03/2008    Social History   Tobacco Use  . Smoking status: Never Smoker  . Smokeless tobacco: Never Used  Substance Use Topics  . Alcohol use: No    Current Outpatient Medications:  .  Betamethasone Valerate 0.12 % foam, Apply 1 application topically daily., Disp: 100 g, Rfl: 6 .  Cholecalciferol (VITAMIN D3) 125 MCG (5000 UT) CHEW, Chew by mouth. Pt takes 2 daily, Disp: , Rfl:  .  Cyanocobalamin (VITAMIN B-12) 5000 MCG LOZG, Take by mouth. Pt takes 2 daily, Disp: , Rfl:  .  levonorgestrel (MIRENA) 20 MCG/24HR IUD, 1 each by Intrauterine route once., Disp: , Rfl:  .  thyroid (ARMOUR) 60 MG tablet, Take 1 tablet by mouth every other day., Disp: , Rfl:  .  valACYclovir (VALTREX) 500 MG tablet, , Disp: , Rfl:   Allergies  Allergen Reactions  . Morphine And Related Hives  . Sulfa Antibiotics Nausea And Vomiting  . Oseltamivir Nausea And Vomiting    Objective:   Temp 100 F (37.8 C)   Patient is well-developed, well-nourished in no acute distress.  Resting comfortably at home.  Head is normocephalic, atraumatic.  No labored breathing.  Speech is clear and coherent with logical content.  Patient is alert and oriented at baseline.   Assessment and Plan:   1. Suspected  COVID-19 virus infection Known exposure.  Classic symptoms.  Were mild for the first week but acutely worsened after day 7.  Breathing is stable.  Temperature is being managed with OTC medications.  Has a bit of gastritis which is complicating her home treatments for other symptoms.  Rx Zofran ODT 8 mg to help with nausea.  We are also starting Protonix for her  gastritis.  Continue bland diet as directed.  Patient is to start OTC regimen of vitamin C, vitamin D3 and zinc as directed.  Supportive measures and other OTC medications reviewed.  Patient has been rolled in a Covid symptom monitoring program.  Strict ER precautions reviewed with patient.  Patient voiced understanding and agreement with the plan.  Written copy of instruction was sent to patient's my chart for viewing.  - ondansetron (ZOFRAN ODT) 8 MG disintegrating tablet; Take 1 tablet (8 mg total) by mouth every 8 (eight) hours as needed for nausea or vomiting.  Dispense: 20 tablet; Refill: 0 - MyChart COVID-19 home monitoring program; Future - Temperature monitoring; Future  2. Acute gastritis without hemorrhage, unspecified gastritis type Continue working on bland diet.  Limit NSAIDs.  Rx Zofran and Protonix.  Take as directed.  Strict ER precautions reviewed with patient. - ondansetron (ZOFRAN ODT) 8 MG disintegrating tablet; Take 1 tablet (8 mg total) by mouth every 8 (eight) hours as needed for nausea or vomiting.  Dispense: 20 tablet; Refill: 0 - pantoprazole (PROTONIX) 40 MG tablet; Take 1 tablet (40 mg total) by mouth daily.  Dispense: 30 tablet; Refill: 3    Leeanne Rio, [place] 01/25/2020

## 2020-01-25 NOTE — Progress Notes (Signed)
I have discussed the procedure for the virtual visit with the patient who has given consent to proceed with assessment and treatment.   Kelsey [name], CMA     

## 2020-01-28 ENCOUNTER — Encounter: Payer: Self-pay | Admitting: Physician Assistant

## 2020-01-28 ENCOUNTER — Telehealth: Payer: Self-pay | Admitting: Family Medicine

## 2020-01-28 NOTE — Telephone Encounter (Signed)
Advised patient note for work was added to her my chart. She wanted emailed to her to forward to her employer. Emailed to patient

## 2020-01-28 NOTE — Telephone Encounter (Signed)
Patient has called in stating that she has sent a message via mychart requesting a letter to return back to work either in person or virtually.  Patient states that in order to return to work virtually for Monday she would need a letter to give to her employer by 2pm today.

## 2020-01-28 NOTE — Telephone Encounter (Signed)
Ok to return to working remotely on Monday.

## 2020-01-28 NOTE — Telephone Encounter (Signed)
Please advise when patient can return back to work virtually or in person

## 2020-01-31 ENCOUNTER — Telehealth: Payer: Self-pay | Admitting: Family Medicine

## 2020-01-31 ENCOUNTER — Encounter (HOSPITAL_BASED_OUTPATIENT_CLINIC_OR_DEPARTMENT_OTHER): Payer: Self-pay

## 2020-01-31 ENCOUNTER — Other Ambulatory Visit: Payer: Self-pay

## 2020-01-31 ENCOUNTER — Emergency Department (HOSPITAL_BASED_OUTPATIENT_CLINIC_OR_DEPARTMENT_OTHER)
Admission: EM | Admit: 2020-01-31 | Discharge: 2020-01-31 | Disposition: A | Discharge status: Home / Self Care | Payer: PRIVATE HEALTH INSURANCE | Attending: Emergency Medicine | Admitting: Emergency Medicine

## 2020-01-31 ENCOUNTER — Emergency Department (HOSPITAL_BASED_OUTPATIENT_CLINIC_OR_DEPARTMENT_OTHER): Payer: PRIVATE HEALTH INSURANCE

## 2020-01-31 DIAGNOSIS — E039 Hypothyroidism, unspecified: Secondary | ICD-10-CM | POA: Insufficient documentation

## 2020-01-31 DIAGNOSIS — R002 Palpitations: Secondary | ICD-10-CM | POA: Diagnosis not present

## 2020-01-31 DIAGNOSIS — R000 Tachycardia, unspecified: Secondary | ICD-10-CM | POA: Diagnosis present

## 2020-01-31 DIAGNOSIS — E559 Vitamin D deficiency, unspecified: Secondary | ICD-10-CM | POA: Diagnosis not present

## 2020-01-31 DIAGNOSIS — U071 COVID-19: Secondary | ICD-10-CM

## 2020-01-31 DIAGNOSIS — R5381 Other malaise: Secondary | ICD-10-CM | POA: Diagnosis not present

## 2020-01-31 DIAGNOSIS — R630 Anorexia: Secondary | ICD-10-CM | POA: Insufficient documentation

## 2020-01-31 DIAGNOSIS — R5383 Other fatigue: Secondary | ICD-10-CM

## 2020-01-31 LAB — URINALYSIS, ROUTINE W REFLEX MICROSCOPIC
Bilirubin Urine: NEGATIVE
Glucose, UA: NEGATIVE mg/dL
Ketones, ur: >80 mg/dL — AB
Leukocytes,Ua: NEGATIVE
Nitrite: NEGATIVE
Protein, ur: NEGATIVE mg/dL
Specific Gravity, Urine: 1.01 (ref 1.005–1.030)
pH: 8 (ref 5.0–8.0)

## 2020-01-31 LAB — HCG, SERUM, QUALITATIVE: Preg, Serum: NEGATIVE

## 2020-01-31 LAB — TROPONIN I (HIGH SENSITIVITY): Troponin I (High Sensitivity): 5 ng/L (ref <18)

## 2020-01-31 LAB — CBC WITH DIFFERENTIAL/PLATELET
Abs Immature Granulocytes: 0.02 10*3/uL (ref 0.00–0.07)
Basophils Absolute: 0 10*3/uL (ref 0.0–0.1)
Basophils Relative: 0 %
Eosinophils Absolute: 0 10*3/uL (ref 0.0–0.5)
Eosinophils Relative: 1 %
HCT: 43 % (ref 36.0–46.0)
Hemoglobin: 15.3 g/dL — ABNORMAL HIGH (ref 12.0–15.0)
Immature Granulocytes: 0 %
Lymphocytes Relative: 44 %
Lymphs Abs: 2.1 10*3/uL (ref 0.7–4.0)
MCH: 30.6 pg (ref 26.0–34.0)
MCHC: 35.6 g/dL (ref 30.0–36.0)
MCV: 86 fL (ref 80.0–100.0)
Monocytes Absolute: 0.7 10*3/uL (ref 0.1–1.0)
Monocytes Relative: 14 %
Neutro Abs: 1.9 10*3/uL (ref 1.7–7.7)
Neutrophils Relative %: 41 %
Platelets: 106 10*3/uL — ABNORMAL LOW (ref 150–400)
RBC: 5 MIL/uL (ref 3.87–5.11)
RDW: 12 % (ref 11.5–15.5)
Smear Review: DECREASED
WBC: 4.8 10*3/uL (ref 4.0–10.5)
nRBC: 0 % (ref 0.0–0.2)

## 2020-01-31 LAB — COMPREHENSIVE METABOLIC PANEL
ALT: 55 U/L — ABNORMAL HIGH (ref 0–44)
AST: 24 U/L (ref 15–41)
Albumin: 4 g/dL (ref 3.5–5.0)
Alkaline Phosphatase: 49 U/L (ref 38–126)
Anion gap: 14 (ref 5–15)
BUN: 7 mg/dL (ref 6–20)
CO2: 24 mmol/L (ref 22–32)
Calcium: 8.9 mg/dL (ref 8.9–10.3)
Chloride: 99 mmol/L (ref 98–111)
Creatinine, Ser: 0.62 mg/dL (ref 0.44–1.00)
GFR calc Af Amer: >60 mL/min (ref >60)
GFR calc non Af Amer: >60 mL/min (ref >60)
Glucose, Bld: 86 mg/dL (ref 70–99)
Potassium: 3.2 mmol/L — ABNORMAL LOW (ref 3.5–5.1)
Sodium: 137 mmol/L (ref 135–145)
Total Bilirubin: 1.1 mg/dL (ref 0.3–1.2)
Total Protein: 7 g/dL (ref 6.5–8.1)

## 2020-01-31 LAB — LACTIC ACID, PLASMA: Lactic Acid, Venous: 0.9 mmol/L (ref 0.5–1.9)

## 2020-01-31 LAB — URINALYSIS, MICROSCOPIC (REFLEX): WBC, UA: NONE SEEN WBC/hpf (ref 0–5)

## 2020-01-31 LAB — TSH: TSH: 2.138 u[IU]/mL (ref 0.350–4.500)

## 2020-01-31 LAB — T4, FREE: Free T4: 1.28 ng/dL — ABNORMAL HIGH (ref 0.61–1.12)

## 2020-01-31 MED ORDER — SODIUM CHLORIDE 0.9 % IV SOLN
1000.0000 mL | INTRAVENOUS | Status: DC
  Administered 2020-01-31: 500 mL via INTRAVENOUS

## 2020-01-31 MED ORDER — SODIUM CHLORIDE 0.9 % IV BOLUS
500.0000 mL | Freq: Once | INTRAVENOUS | Status: AC
  Administered 2020-01-31: 500 mL via INTRAVENOUS

## 2020-01-31 MED ORDER — SODIUM CHLORIDE 0.9 % IV BOLUS (SEPSIS)
500.0000 mL | Freq: Once | INTRAVENOUS | Status: AC
  Administered 2020-01-31: 500 mL via INTRAVENOUS

## 2020-01-31 MED ORDER — IOHEXOL 350 MG/ML SOLN
100.0000 mL | Freq: Once | INTRAVENOUS | Status: AC
  Administered 2020-01-31: 100 mL via INTRAVENOUS

## 2020-01-31 NOTE — ED Notes (Signed)
Pt ambulatory to bathroom

## 2020-01-31 NOTE — Telephone Encounter (Signed)
Nurse Assessment Nurse: Gildardo Pounds, RN, Amy Date/Time Eilene Ghazi Time): 01/31/2020 9:39:43 AM Confirm and document reason for call. If symptomatic, describe symptoms. ---Caller states she is having shortness of breath and tested positive for covid last Tuesday. Her symptoms started the previous week. She said it might be from a mental health issue also. She is under a lot stress right now. She has an elevated HR. It is 114 right now. It has gotten up to 140. The last couple months it feels like her heart is skipping beats. It happen once every minutes or so. Has the patient had close contact with a person known or suspected to have the novel coronavirus illness OR traveled / lives in area with major community spread (including international travel) in the last 14 days from the onset of symptoms? * If Asymptomatic, screen for exposure and travel within the last 14 days. ---Yes Does the patient have any new or worsening symptoms? ---Yes Will a triage be completed? ---Yes Related visit to physician within the last 2 weeks? ---No Does the PT have any chronic conditions? (i.e. diabetes, asthma, this includes High risk factors for pregnancy, etc.) ---Yes List chronic conditions. ---asthma, anxiety Is the patient pregnant or possibly pregnant? (Ask all females between the ages of 32-55) ---No Is this a behavioral health or substance abuse call? ---NoPLEASE NOTE: All timestamps contained within this report are represented as [place] Standard Time. CONFIDENTIALTY NOTICE: This fax transmission is intended only for the addressee. It contains information that is legally privileged, confidential or otherwise protected from use or disclosure. If you are not the intended recipient, you are strictly prohibited from reviewing, disclosing, copying using or disseminating any of this information or taking any action in reliance on or regarding this information. If you have received this fax in error, please  notify us immediately by telephone so that we can arrange for its return to [place]. Phone: [phone], Toll-Free: [phone], Fax: [phone] Page: 2 of 2 Call Id: [id number] Guidelines Guideline Title Affirmed Question Affirmed Notes Nurse Date/Time Eilene Ghazi Time) Heart Rate and Heartbeat Questions Difficulty breathing Lovelace, RN, Amy 01/31/2020 9:42:50 AM Disp. Time Eilene Ghazi Time) Disposition Final User 01/31/2020 9:38:46 AM Send to Urgent Queue Silvano Rusk 01/31/2020 9:45:08 AM Go to ED Now Yes Lovelace, RN, Amy Caller Disagree/Comply Comply Caller Understands Yes PreDisposition InappropriateToAsk Care Advice Given Per Guideline GO TO ED NOW: * You need to be seen in the Emergency Department. * Go to the ED at ___________ [place] now. Drive carefully. NOTE TO TRIAGER - DRIVING: * Another adult should drive. * If immediate transportation is not available via car or taxi, then the patient should be instructed to call [id number]. ANOTHER ADULT SHOULD DRIVE: * It is better and safer if another adult drives instead of you. BRING MEDICINES: * Please bring a list of your current medicines when you go to the Emergency Department (ER). * It is also a good idea to bring the pill bottles too. This will help the doctor to make certain you are taking the right medicines and the right dose. CARE ADVICE given per Heart Rate and Heartbeat Questions (Adult) guideline

## 2020-01-31 NOTE — Discharge Instructions (Addendum)
1.  Rest and stay hydrated.  Take Tylenol and ibuprofen for body aches. 2.  Your heart rate has improved with hydration and rest.  You describe frequent episodes of palpitations.  Follow-up with your doctor and [facility], contact information provided in your discharge instructions. 3.  Return to the emergency department if you have worsening or changing symptoms

## 2020-01-31 NOTE — ED Provider Notes (Signed)
[facility] EMERGENCY DEPARTMENT Provider Note   CSN: [id number] Arrival date & time: 01/31/20  1515     History Chief Complaint  Patient presents with  . Tachycardia    Kelsey [name] is a 44 y.o. female.  HPI Patient reports started getting symptoms of fevers and body aches and chills about 2 weeks ago.  She reports her husband tested positive for Covid but she did not.  She however continued to have symptoms of general fatigue weakness and achiness.  She then had a recheck on her Covid last week and ended up testing positive.  She reports she has had extreme fatigue, poor appetite.  She reports she has been in bed a lot.  She is trying to stay hydrated but is eating very little.  She reports she starting to feel like her heart was skipping and racing.  She is timing her heart rate today and it never went under 100.  She reports she became concerned.  Patient reports that she had a brother who died suddenly from long QT syndrome.  She reports that was when she was very young and she was tested.  Reports she was on beta-blockers for about 13 years and was released from [facility] "cleared" a number of years ago.    Past Medical History:  Diagnosis Date  . Abnormal Pap smear   . Anemia   . Bipolar disorder ([place])   . Chronic sinusitis   . Headache(784.0)   . Pyelonephritis 2008   Pylo and stones in the past  . Sinus problem    sinusitis    Patient Active Problem List   Diagnosis Date Noted  . Rheumatoid factor positive 03/23/2018  . Acquired hypothyroidism 02/12/2018  . Asthma with allergic rhinitis 12/12/2014  . Vitamin D deficiency 12/12/2014  . Nevus of finger 09/14/2013  . Migraine, unspecified, without mention of intractable migraine without mention of status migrainosus 06/21/2013  . Bulimia nervosa 06/21/2013  . Routine general medical examination at a health care facility 05/13/2012  . Contact dermatitis 05/04/2012  . Bunion of great toe 12/26/2010  .  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 Allergic rhinitis 07/16/2010  . MYALGIA 06/25/2010  . VERTIGO 05/07/2010  . LOW BACK PAIN SYNDROME 03/02/2010  . NEOPLASM OF UNCERTAIN BEHAVIOR OF SKIN 10/03/2008    Past Surgical History:  Procedure Laterality Date  . CESAREAN SECTION    . CESAREAN SECTION  08/31/2011   Procedure: CESAREAN SECTION;  Surgeon: [name], MD;  Location: [facility];  Service: Gynecology;  Laterality: [name];  Repeat cesarean section with delivery of baby   . COLPOSCOPY    . KNEE ARTHROSCOPY     right knee  . WISDOM TOOTH EXTRACTION       OB History    Gravida  2   Para  2   Term  2   Preterm      AB      Living  2     SAB      TAB      Ectopic      Multiple      Live Births  1           Family History  Problem Relation Age of Onset  . Heart disease Brother        sudden cardiac death  . Asthma Brother   . Heart disease Maternal Grandfather 5       MI  . Coronary artery disease Maternal Grandfather   .  Diabetes Maternal Grandfather   . Kidney disease Maternal Grandfather   . Cancer Neg Hx   . Stroke Neg Hx   . Hypertension Neg Hx     Social History   Tobacco Use  . Smoking status: Never Smoker  . Smokeless tobacco: Never Used  Substance Use Topics  . Alcohol use: No  . Drug use: No    Home Medications Prior to Admission medications   Medication Sig Start Date End Date Taking? Authorizing Provider  Betamethasone Valerate 0.12 % foam Apply 1 application topically daily. 07/23/19   Midge Minium, MD  Cholecalciferol (VITAMIN D3) 125 MCG (5000 UT) CHEW Chew by mouth. Pt takes 2 daily    [provider]  Cyanocobalamin (VITAMIN B-12) 5000 MCG LOZG Take by mouth. Pt takes 2 daily    [provider]  levonorgestrel (MIRENA) 20 MCG/24HR IUD 1 each by Intrauterine route once.    [provider]  ondansetron (ZOFRAN ODT) 8 MG disintegrating tablet Take 1 tablet (8 mg total) by mouth every 8  (eight) hours as needed for nausea or vomiting. 01/25/20   Brunetta Jeans, PA-C  pantoprazole (PROTONIX) 40 MG tablet Take 1 tablet (40 mg total) by mouth daily. 01/25/20   Brunetta Jeans, PA-C  thyroid (ARMOUR) 60 MG tablet Take 1 tablet by mouth every other day. 12/28/18   [provider]  valACYclovir (VALTREX) 500 MG tablet  12/14/18   [provider]    Allergies    Morphine and related, Sulfa antibiotics, and Oseltamivir  Review of Systems   Review of Systems 10 systems reviewed and negative except as per HPI Physical Exam Updated Vital Signs BP (!) 137/91   Pulse 72   Temp 98.7 F (37.1 C) (Oral)   Resp 13   Ht 5\' 3"  (1.6 m)   Wt 63.5 kg   SpO2 98%   BMI 24.80 kg/m   Physical Exam Constitutional:      Comments: Alert and nontoxic.  No respiratory distress.  Patient appears slightly fatigued.  HENT:     Head: Normocephalic and atraumatic.     Mouth/Throat:     Mouth: Mucous membranes are moist.     Pharynx: Oropharynx is clear.  Eyes:     Extraocular Movements: Extraocular movements intact.     Conjunctiva/sclera: Conjunctivae normal.  Cardiovascular:     Rate and Rhythm: Normal rate and regular rhythm.  Pulmonary:     Effort: Pulmonary effort is normal.     Breath sounds: Normal breath sounds.  Abdominal:     General: There is no distension.     Palpations: Abdomen is soft.     Tenderness: There is no abdominal tenderness. There is no guarding.  Musculoskeletal:        General: No swelling or tenderness. Normal range of motion.     Cervical back: Neck supple.     Right lower leg: No edema.     Left lower leg: No edema.  Skin:    General: Skin is warm and dry.  Neurological:     General: No focal deficit present.     Mental Status: She is oriented to person, place, and time.     Coordination: Coordination normal.  Psychiatric:        Mood and Affect: Mood normal.     ED Results / Procedures / Treatments   Labs (all labs ordered  are listed, but only abnormal results are displayed) Labs Reviewed  COMPREHENSIVE METABOLIC PANEL -  Abnormal; Notable for the following components:      Result Value   Potassium 3.2 (*)    ALT 55 (*)    All other components within normal limits  CBC WITH DIFFERENTIAL/PLATELET - Abnormal; Notable for the following components:   Hemoglobin 15.3 (*)    Platelets 106 (*)    All other components within normal limits  URINALYSIS, ROUTINE W REFLEX MICROSCOPIC - Abnormal; Notable for the following components:   Hgb urine dipstick TRACE (*)    Ketones, ur >80 (*)    All other components within normal limits  T4, FREE - Abnormal; Notable for the following components:   Free T4 1.28 (*)    All other components within normal limits  URINALYSIS, MICROSCOPIC (REFLEX) - Abnormal; Notable for the following components:   Bacteria, UA RARE (*)    All other components within normal limits  LACTIC ACID, PLASMA  HCG, SERUM, QUALITATIVE  TSH  LACTIC ACID, PLASMA  TROPONIN I (HIGH SENSITIVITY)    EKG EKG Interpretation  Date/Time:  Monday January 31 2020 15:42:18 EDT Ventricular Rate:  123 PR Interval:  158 QRS Duration: 82 QT Interval:  326 QTC Calculation: 466 R Axis:   85 Text Interpretation: Sinus tachycardia Otherwise normal ECG agree, rate signigicantly increased from previous Confirmed by Charlesetta Shanks [phone]) on 01/31/2020 6:32:09 PM   Radiology CT Angio Chest PE W/Cm &/Or Wo Cm  Result Date: 01/31/2020 CLINICAL DATA:  Chest pain history of COVID EXAM: CT ANGIOGRAPHY CHEST WITH CONTRAST TECHNIQUE: Multidetector CT imaging of the chest was performed using the standard protocol during bolus administration of intravenous contrast. Multiplanar CT image reconstructions and MIPs were obtained to evaluate the vascular anatomy. CONTRAST:  144mL OMNIPAQUE IOHEXOL 350 MG/ML SOLN COMPARISON:  Chest x-ray 02/05/2013 FINDINGS: Cardiovascular: Satisfactory opacification of the pulmonary arteries to the  segmental level. No evidence of pulmonary embolism. Normal heart size. No pericardial effusion. Nonaneurysmal aorta. No dissection is seen. Mediastinum/Nodes: No enlarged mediastinal, hilar, or axillary lymph nodes. Thyroid gland, trachea, and esophagus demonstrate no significant findings. Lungs/Pleura: Lungs are clear. No pleural effusion or pneumothorax. Upper Abdomen: No acute abnormality. Musculoskeletal: No chest wall abnormality. No acute or significant osseous findings. Review of the MIP images confirms the above findings. IMPRESSION: Negative. No CT evidence for acute pulmonary embolus or aortic dissection. Clear lung fields. Electronically Signed   By: Donavan Foil M.D.   On: 01/31/2020 21:22    Procedures Procedures (including critical care time)  Medications Ordered in ED Medications  sodium chloride 0.9 % bolus 500 mL (0 mLs Intravenous Stopped 01/31/20 2153)    Followed by  0.9 %  sodium chloride infusion (0 mLs Intravenous Stopped 01/31/20 2322)  iohexol (OMNIPAQUE) 350 MG/ML injection 100 mL (100 mLs Intravenous Contrast Given 01/31/20 2107)  sodium chloride 0.9 % bolus 500 mL (0 mLs Intravenous Stopped 01/31/20 2322)    ED Course  I have reviewed the triage vital signs and the nursing notes.  Pertinent labs & imaging results that were available during my care of the patient were reviewed by me and considered in my medical decision making (see chart for details).    MDM Rules/Calculators/A&P                         Patient presents as outlined above.  She has had a number of weeks of symptoms of malaise, fatigue and cough.  She ultimately tested positive for Covid about a week ago.  She  reports today she had elevated heart rate all day.  CT PE study obtained.  No evidence of pulmonary embolus or residual inflammatory changes to suggest advancing Covid pneumonia.  Patient appears dehydrated with tachycardia and elevated ketones in the urine.  Rehydrated with normal saline.  Heart  rate improved into the 70s.  Patient's mental status is clear.  She does not have respiratory distress.  At this time I feel she is stable for discharge.  She had concern for palpitations and elevated heart rate.  She reports the symptoms did also precede her Covid diagnosis and were waxing and waning.  At this time recommendation is a follow-up with her PCP and cardiology for palpitations.  EKG does not show any acute concerning findings.  Patient was tachycardic but responded to fluid resuscitation.  At this time stable for discharge with return precautions reviewed. Final Clinical Impression(s) / ED Diagnoses Final diagnoses:  COVID-19  Palpitations  Malaise and fatigue    Rx / DC Orders ED Discharge Orders    None       Charlesetta Shanks, MD 01/31/20 2330

## 2020-01-31 NOTE — Telephone Encounter (Signed)
FYI. Per note pt is going to ED

## 2020-01-31 NOTE — ED Triage Notes (Signed)
Dx'd with COVID x 1 week, sx began a week before that, has 'heart skipping' sensation, HR 113 in triage, decreased appetite, protonx & zofran for sx relief, some n/d.

## 2020-02-02 ENCOUNTER — Other Ambulatory Visit: Payer: Self-pay

## 2020-02-02 ENCOUNTER — Encounter: Payer: Self-pay | Admitting: Family Medicine

## 2020-02-02 ENCOUNTER — Telehealth (INDEPENDENT_AMBULATORY_CARE_PROVIDER_SITE_OTHER): Payer: PRIVATE HEALTH INSURANCE | Admitting: Family Medicine

## 2020-02-02 VITALS — BP 153/104 | HR 117 | Ht 63.0 in | Wt 139.0 lb

## 2020-02-02 DIAGNOSIS — U071 COVID-19: Secondary | ICD-10-CM | POA: Diagnosis not present

## 2020-02-02 DIAGNOSIS — F329 Major depressive disorder, single episode, unspecified: Secondary | ICD-10-CM

## 2020-02-02 DIAGNOSIS — F32A Depression, unspecified: Secondary | ICD-10-CM

## 2020-02-02 DIAGNOSIS — R030 Elevated blood-pressure reading, without diagnosis of hypertension: Secondary | ICD-10-CM | POA: Diagnosis not present

## 2020-02-02 DIAGNOSIS — F419 Anxiety disorder, unspecified: Secondary | ICD-10-CM | POA: Diagnosis not present

## 2020-02-02 DIAGNOSIS — R002 Palpitations: Secondary | ICD-10-CM

## 2020-02-02 MED ORDER — METOPROLOL SUCCINATE ER 25 MG PO TB24: 25.0000 mg | ORAL_TABLET | Freq: Every day | ORAL | 3 refills | Status: DC

## 2020-02-02 MED ORDER — VORTIOXETINE HBR 10 MG PO TABS: 10.0000 mg | ORAL_TABLET | Freq: Every day | ORAL | 3 refills | Status: DC

## 2020-02-02 NOTE — Progress Notes (Signed)
I have discussed the procedure for the virtual visit with the patient who has given consent to proceed with assessment and treatment.   [name], CMA     

## 2020-02-02 NOTE — Progress Notes (Signed)
Virtual Visit via Video   I connected with patient on 02/02/20 at  9:00 AM EDT by a video enabled telemedicine application and verified that I am speaking with the correct person using two identifiers.  Location patient: Home Location provider: Acupuncturist, Office Persons participating in the virtual visit: Patient, Provider, [place] (Jess B)  I discussed the limitations of evaluation and management by telemedicine and the availability of in person appointments. The patient expressed understanding and agreed to proceed.  Subjective:   HPI:   ER f/u- pt went to ER on 7/26 w/ tachycardia.  She is battling COVID (tested + 7/20 after sxs started 7/18) and was dehydrated upon presentation to ER.  She was given IVF and HR decreased to 70s.  Pt reports ongoing exhaustion and 'I feel really weak'.  Pt reports she is drinking fluids, started eating yesterday.  Pt reports HR remains in the 110s.  Pt reports elevated BP recently.  She had palpitations prior to her COVID dx and these have worsened.  Has cardiology appt upcoming.  Ex-husband has filed a custody lawsuit which is extremely stressful for her.  Currently seeing a therapist through [facility].  Has appt upcoming w/ psychiatrist.  Pt is considering possible depression medication.  'i'm struggling just to be able to function'.  Pt was previously on Trintellix, found this helpful.  ROS:   See pertinent positives and negatives per HPI.  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 Pt takes 2 daily, Disp: , Rfl:    Cyanocobalamin (VITAMIN B-12) 5000 MCG LOZG, Take by mouth. Pt takes 2 daily, Disp: , Rfl:    levonorgestrel (MIRENA) 20 MCG/24HR IUD, 1 each by Intrauterine route once., Disp: , Rfl:    thyroid (ARMOUR) 60 MG tablet, Take 1 tablet by mouth every other day., Disp: , Rfl:    valACYclovir (VALTREX) 500 MG tablet, , Disp: , Rfl:    ondansetron (ZOFRAN ODT) 8 MG disintegrating tablet, Take 1 tablet (8 mg total) by mouth every 8 (eight) hours as needed for nausea or vomiting. (Patient not taking: Reported on 02/02/2020), Disp: 20 tablet, Rfl: 0   pantoprazole (PROTONIX) 40 MG tablet, Take 1 tablet (40 mg total) by mouth daily. (Patient not taking: Reported on 02/02/2020), Disp: 30 tablet, Rfl: 3  Allergies  Allergen Reactions   Morphine And Related Hives   Sulfa Antibiotics Nausea And Vomiting   Oseltamivir Nausea And Vomiting    Objective:  BP (!) 153/104    Pulse (!) 117    Ht 5\' 3"  (1.6 m)    Wt 139 lb (63 kg)    BMI 24.62 kg/m   AAOx3, NAD NCAT, EOMI No obvious CN deficits Coloring WNL Pt is able to speak clearly, coherently without shortness of breath or increased work of breathing.  Thought process is linear.  Mood is appropriate.   Assessment and Plan:   COVID- pt was dx'd on 7/20.  She continues to struggle w/ exhaustion, palpitations, elevated BP.  Has cardiology  f/u pending.  Denies SOB.  Reviewed supportive care and red flags that should prompt immediate evaluation.  Palpitations- pt has hx of similar.  Reports she was having sxs prior to COVID dx.  Is unsure if this is stress, thyroid (TSH WNL in ER), or other cause.  Will start Metoprolol ER 25mg  PRN HR>100.  Has appt scheduled w/ Cardiology  Elevated BP- pt doesn't have a dx of HTN.  Discussed that the physical stress of COVID can cause elevated BP but this is worth monitoring going forward.  Anxiety/Depression- recurrent problem for pt.  Will restart Trintellix and monitor closely for improvement  Annye Asa, MD 02/02/2020

## 2020-02-03 ENCOUNTER — Encounter: Payer: Self-pay | Admitting: Family Medicine

## 2020-02-11 ENCOUNTER — Encounter: Payer: Self-pay | Admitting: Family Medicine

## 2020-02-11 ENCOUNTER — Other Ambulatory Visit: Payer: Self-pay

## 2020-02-11 ENCOUNTER — Telehealth (INDEPENDENT_AMBULATORY_CARE_PROVIDER_SITE_OTHER): Payer: PRIVATE HEALTH INSURANCE | Admitting: Family Medicine

## 2020-02-11 VITALS — BP 140/97 | HR 110 | Temp 99.3°F

## 2020-02-11 DIAGNOSIS — R030 Elevated blood-pressure reading, without diagnosis of hypertension: Secondary | ICD-10-CM

## 2020-02-11 DIAGNOSIS — R002 Palpitations: Secondary | ICD-10-CM

## 2020-02-11 DIAGNOSIS — U071 COVID-19: Secondary | ICD-10-CM | POA: Diagnosis not present

## 2020-02-11 NOTE — Progress Notes (Signed)
Virtual Visit via Video   I connected with patient on 02/11/20 at  2:30 PM EDT by a video enabled telemedicine application and verified that I am speaking with the correct person using two identifiers.  Location patient: Home Location provider: Acupuncturist, Office Persons participating in the virtual visit: Patient, Provider, [place] (Jess B)  I discussed the limitations of evaluation and management by telemedicine and the availability of in person appointments. The patient expressed understanding and agreed to proceed.  Subjective:   HPI:   COVID f/u- pt reports she has been in bed x3 days.  Has had persistent HA- no relief w/ tylenol, ASA, essential oils.  HR remains elevated.  Continued exhaustion.  Did not take Metoprolol despite elevated HR.  Tested + 7/20.  Doesn't feel she would be able to work as she has not been able to get out of bed and move around the house.  ROS:   See pertinent positives and negatives per HPI.  Patient Active Problem List   Diagnosis Date Noted  . Rheumatoid factor positive 03/23/2018  . Acquired hypothyroidism 02/12/2018  . Asthma with allergic rhinitis 12/12/2014  . Vitamin D deficiency 12/12/2014  . Nevus of finger 09/14/2013  . Migraine, unspecified, without mention of intractable migraine without mention of status migrainosus 06/21/2013  . Bulimia nervosa 06/21/2013  . Routine general medical examination at a health care facility 05/13/2012  . Contact dermatitis 05/04/2012  . Bunion of great toe 12/26/2010  . 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 Allergic rhinitis 07/16/2010  . MYALGIA 06/25/2010  . VERTIGO 05/07/2010  . LOW BACK PAIN SYNDROME 03/02/2010  . NEOPLASM OF UNCERTAIN BEHAVIOR OF SKIN 10/03/2008    Social History   Tobacco Use  . Smoking status: Never Smoker  . Smokeless tobacco: Never Used  Substance Use Topics  . Alcohol use: No    Current Outpatient Medications:  .  Betamethasone Valerate 0.12 % foam, Apply 1  application topically daily., Disp: 100 g, Rfl: 6 .  Cholecalciferol (VITAMIN D3) 125 MCG (5000 UT) CHEW, Chew by mouth. Pt takes 2 daily, Disp: , Rfl:  .  Cyanocobalamin (VITAMIN B-12) 5000 MCG LOZG, Take by mouth. Pt takes 2 daily, Disp: , Rfl:  .  levonorgestrel (MIRENA) 20 MCG/24HR IUD, 1 each by Intrauterine route once., Disp: , Rfl:  .  pantoprazole (PROTONIX) 40 MG tablet, Take 1 tablet (40 mg total) by mouth daily., Disp: 30 tablet, Rfl: 3 .  vortioxetine HBr (TRINTELLIX) 10 MG TABS tablet, Take 1 tablet (10 mg total) by mouth daily., Disp: 30 tablet, Rfl: 3 .  metoprolol succinate (TOPROL-XL) 25 MG 24 hr tablet, Take 1 tablet (25 mg total) by mouth daily. (Patient not taking: Reported on 02/11/2020), Disp: 30 tablet, Rfl: 3 .  ondansetron (ZOFRAN ODT) 8 MG disintegrating tablet, Take 1 tablet (8 mg total) by mouth every 8 (eight) hours as needed for nausea or vomiting. (Patient not taking: Reported on 02/11/2020), Disp: 20 tablet, Rfl: 0 .  thyroid (ARMOUR) 60 MG tablet, Take 1 tablet by mouth every other day. (Patient not taking: Reported on 02/11/2020), Disp: , Rfl:  .  valACYclovir (VALTREX) 500 MG tablet, , Disp: , Rfl:   Allergies  Allergen Reactions  . Morphine And Related Hives  . Sulfa Antibiotics Nausea And Vomiting  . Oseltamivir Nausea And Vomiting    Objective:   BP (!) 140/97   Pulse (!) 110   Temp 99.3 F (37.4 C)   AAOx3, NAD NCAT, EOMI No obvious  CN deficits Coloring WNL Pt is able to speak clearly, coherently without shortness of breath or increased work of breathing.  Thought process is linear.  Mood is appropriate.   Assessment and Plan:   COVID infxn/palpitations/elevated BP- stressed that pt needs to start metoprolol at this time given that she is still symptomatic w/ HR >100.  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  Encouraged tylenol/ibuprofen, fluids, rest.  Note provided to remain out of work.   Reviewed supportive care and red flags that should prompt return.  Pt expressed understanding and is in agreement w/ plan.    Annye Asa, MD 02/11/2020

## 2020-02-11 NOTE — Progress Notes (Signed)
I have discussed the procedure for the virtual visit with the patient who has given consent to proceed with assessment and treatment.   Antonios Ostrow L Taylormarie Register, CMA     

## 2020-02-16 ENCOUNTER — Other Ambulatory Visit: Payer: Self-pay

## 2020-02-16 ENCOUNTER — Other Ambulatory Visit (INDEPENDENT_AMBULATORY_CARE_PROVIDER_SITE_OTHER): Payer: PRIVATE HEALTH INSURANCE

## 2020-02-16 DIAGNOSIS — R002 Palpitations: Secondary | ICD-10-CM

## 2020-02-16 LAB — CBC WITH DIFFERENTIAL/PLATELET
Basophils Absolute: 0 10*3/uL (ref 0.0–0.1)
Basophils Relative: 0.8 % (ref 0.0–3.0)
Eosinophils Absolute: 0 10*3/uL (ref 0.0–0.7)
Eosinophils Relative: 0.4 % (ref 0.0–5.0)
HCT: 40.9 % (ref 36.0–46.0)
Hemoglobin: 14 g/dL (ref 12.0–15.0)
Lymphocytes Relative: 29.7 % (ref 12.0–46.0)
Lymphs Abs: 1.5 10*3/uL (ref 0.7–4.0)
MCHC: 34.2 g/dL (ref 30.0–36.0)
MCV: 90.2 fl (ref 78.0–100.0)
Monocytes Absolute: 0.5 10*3/uL (ref 0.1–1.0)
Monocytes Relative: 9.8 % (ref 3.0–12.0)
Neutro Abs: 3.1 10*3/uL (ref 1.4–7.7)
Neutrophils Relative %: 59.3 % (ref 43.0–77.0)
Platelets: 238 10*3/uL (ref 150.0–400.0)
RBC: 4.53 Mil/uL (ref 3.87–5.11)
RDW: 13.2 % (ref 11.5–15.5)
WBC: 5.2 10*3/uL (ref 4.0–10.5)

## 2020-02-16 LAB — BASIC METABOLIC PANEL
BUN: 10 mg/dL (ref 6–23)
CO2: 24 mEq/L (ref 19–32)
Calcium: 9.1 mg/dL (ref 8.4–10.5)
Chloride: 103 mEq/L (ref 96–112)
Creatinine, Ser: 0.77 mg/dL (ref 0.40–1.20)
GFR: 81.87 mL/min (ref >60.00)
Glucose, Bld: 90 mg/dL (ref 70–99)
Potassium: 3.9 mEq/L (ref 3.5–5.1)
Sodium: 140 mEq/L (ref 135–145)

## 2020-02-16 LAB — TSH: TSH: 2.21 u[IU]/mL (ref 0.35–4.50)

## 2020-02-24 ENCOUNTER — Telehealth: Payer: Self-pay | Admitting: Family Medicine

## 2020-02-24 NOTE — Telephone Encounter (Signed)
Pt called stating she received a call from the company that deals with short term disability for her employer. Pt states they need an additional form filled out along with the letter that was sent. Please advise.

## 2020-02-24 NOTE — Telephone Encounter (Signed)
Called patient to get more clarification. She states the short term disability company sent [place] over additional paperwork that needed to be filled out. Patient states she will reach out to the company to speak with someone. Informed patient that I would check to see if we received the paperwork as well. She states we would have received the papers via fax about a week and a half ago.

## 2020-02-25 ENCOUNTER — Ambulatory Visit (INDEPENDENT_AMBULATORY_CARE_PROVIDER_SITE_OTHER): Payer: PRIVATE HEALTH INSURANCE

## 2020-02-25 ENCOUNTER — Encounter: Payer: Self-pay | Admitting: *Deleted

## 2020-02-25 ENCOUNTER — Other Ambulatory Visit: Payer: Self-pay

## 2020-02-25 ENCOUNTER — Ambulatory Visit (INDEPENDENT_AMBULATORY_CARE_PROVIDER_SITE_OTHER): Payer: PRIVATE HEALTH INSURANCE | Admitting: Cardiology

## 2020-02-25 VITALS — BP 150/108 | HR 100 | Ht 63.0 in | Wt 145.0 lb

## 2020-02-25 DIAGNOSIS — R002 Palpitations: Secondary | ICD-10-CM | POA: Diagnosis not present

## 2020-02-25 DIAGNOSIS — I10 Essential (primary) hypertension: Secondary | ICD-10-CM | POA: Diagnosis not present

## 2020-02-25 DIAGNOSIS — Z8249 Family history of ischemic heart disease and other diseases of the circulatory system: Secondary | ICD-10-CM | POA: Insufficient documentation

## 2020-02-25 DIAGNOSIS — R5383 Other fatigue: Secondary | ICD-10-CM

## 2020-02-25 DIAGNOSIS — R0602 Shortness of breath: Secondary | ICD-10-CM | POA: Insufficient documentation

## 2020-02-25 MED ORDER — ATENOLOL 25 MG PO TABS: 25.0000 mg | ORAL_TABLET | Freq: Every day | ORAL | 3 refills | Status: DC

## 2020-02-25 NOTE — Patient Instructions (Signed)
Medication Instructions:  Your physician has recommended you make the following change in your medication:   Take 25 mg Atenolol daily.  *If you need a refill on your cardiac medications before your next appointment, please call your pharmacy*   Lab Work: Your physician recommends that you return for lab work in: today. We checked your Viatmin D and Vitamin B12.  If you have labs (blood work) drawn today and your tests are completely normal, you will receive your results only by:  [place] (if you have MyChart) OR  A paper copy in the mail If you have any lab test that is abnormal or we need to change your treatment, we will call you to review the results.   Testing/Procedures: Your physician has requested that you have an echocardiogram. Echocardiography is a painless test that uses sound waves to create images of your heart. It provides your doctor with information about the size and shape of your heart and how well your hearts chambers and valves are working. This procedure takes approximately one hour. There are no restrictions for this procedure.   WHY IS MY DOCTOR PRESCRIBING ZIO? The Zio system is proven and trusted by physicians to detect and diagnose irregular heart rhythms -- and has been prescribed to hundreds of thousands of patients.  The FDA has cleared the Zio system to monitor for many different kinds of irregular heart rhythms. In a study, physicians were able to reach a diagnosis 90% of the time with the Zio system1.  You can wear the Zio monitor -- a small, discreet, comfortable patch -- during your normal day-to-day activity, including while you sleep, shower, and exercise, while it records every single heartbeat for analysis.  1Barrett, P., et al. Comparison of 24 Hour Holter Monitoring Versus 14 Day Novel Adhesive Patch Electrocardiographic Monitoring. [place], 2014.  ZIO VS. HOLTER MONITORING The Zio monitor can be comfortably worn  for up to 14 days. Holter monitors can be worn for 24 to 48 hours, limiting the time to record any irregular heart rhythms you may have. Zio is able to capture data for the 51% of patients who have their first symptom-triggered arrhythmia after 48 hours.1  LIVE WITHOUT RESTRICTIONS The Zio ambulatory cardiac monitor is a small, unobtrusive, and water-resistant patch--you might even forget youre wearing it. The Zio monitor records and stores every beat of your heart, whether you're sleeping, working out, or showering.  Wear the monitor for 14 days, remove 03/10/2020.   Follow-Up: At [facility], you and your health needs are our priority.  As part of our continuing mission to provide you with exceptional heart care, we have created designated Provider Care Teams.  These Care Teams include your primary Cardiologist (physician) and Advanced Practice Providers (APPs -  Physician Assistants and Nurse Practitioners) who all work together to provide you with the care you need, when you need it.  We recommend signing up for the patient portal called "MyChart".  Sign up information is provided on this After Visit Summary.  MyChart is used to connect with patients for Virtual Visits (Telemedicine).  Patients are able to view lab/test results, encounter notes, upcoming appointments, etc.  Non-urgent messages can be sent to your provider as well.   To learn more about what you can do with MyChart, go to [email or web address].    Your next appointment:   1 month(s)  The format for your next appointment:   In Person  Provider:   Berniece Salines, DO  Other Instructions Atenolol Tablets What is this medicine? ATENOLOL (a TEN oh lole) is a beta blocker. It decreases the amount of work your heart has to do and helps your heart beat regularly. It treats high blood pressure and/or prevent chest pain (also called angina). It is also used after a heart attack to prevent a second one. This medicine may be used  for other purposes; ask your health care provider or pharmacist if you have questions. COMMON BRAND NAME(S): Tenormin What should I tell my health care provider before I take this medicine? They need to know if you have any of these conditions:  diabetes  heart or vessel disease like slow heart rate, worsening heart failure, heart block, sick sinus syndrome or Raynaud's disease  kidney disease  lung or breathing disease, like asthma or emphysema  pheochromocytoma  thyroid disease  an unusual or allergic reaction to atenolol, other beta-blockers, medicines, foods, dyes, or preservatives  pregnant or trying to get pregnant  breast-feeding How should I use this medicine? Take this drug by mouth. Take it as directed on the prescription label at the same time every day. You can take it with or without food. If it upsets your stomach, take it with food. Keep taking it unless your health care provider tells you to stop. Talk to your health care provider about the use of this drug in children. Special care may be needed. Overdosage: If you think you have taken too much of this medicine contact a poison control center or emergency room at once. NOTE: This medicine is only for you. Do not share this medicine with others. What if I miss a dose? If you miss a dose, take it as soon as you can. If it is almost time for your next dose, take only that dose. Do not take double or extra doses. What may interact with this medicine? This medicine may interact with the following medications:  certain medicines for blood pressure, heart disease, irregular heart beat  clonidine  digoxin  diuretics  dobutamine  epinephrine  isoproterenol  NSAIDs, medicines for pain and inflammation, like ibuprofen or naproxen  reserpine This list may not describe all possible interactions. Give your health care provider a list of all the medicines, herbs, non-prescription drugs, or dietary supplements you  use. Also tell them if you smoke, drink alcohol, or use illegal drugs. Some items may interact with your medicine. What should I watch for while using this medicine? Visit your doctor or health care professional for regular check ups. Check your blood pressure and pulse rate regularly. Ask your health care professional what your blood pressure and pulse rate should be, and when you should contact him or her. You may get drowsy or dizzy. Do not drive, use machinery, or do anything that needs mental alertness until you know how this medicine affects you. Do not stand or sit up quickly. Alcohol may interfere with the effect of this medicine. Avoid alcoholic drinks. This medicine may increase blood sugar. Ask your healthcare provider if changes in diet or medicines are needed if you have diabetes. Do not treat yourself for coughs, colds, or pain while you are taking this medicine without asking your doctor or health care professional for advice. Some ingredients may increase your blood pressure. What side effects may I notice from receiving this medicine? Side effects that you should report to your doctor or health care professional as soon as possible:  allergic reactions like skin rash, itching or hives, swelling  of the face, lips, or tongue  breathing problems  changes in vision  chest pain  cold, tingling, or numb hands or feet  depression  fast, irregular heartbeat  feeling faint or lightheaded, falls  fever with sore throat  rapid weight gain   signs and symptoms of high blood sugar such as being more thirsty or hungry or having to urinate more than normal. You may also feel very tired or have blurry vision.  swollen ankles, legs Side effects that usually do not require medical attention (report to your doctor or health care professional if they continue or are bothersome):  anxiety, nervous  diarrhea  dry skin  change in sex drive or performance  headache  nightmares  or trouble sleeping  short term memory loss  stomach upset  unusually tired This list may not describe all possible side effects. Call your doctor for medical advice about side effects. You may report side effects to FDA at 1-800-FDA-1088. Where should I keep my medicine? Keep out of the reach of children and pets. Store at room temperature between 20 and 25 degrees C (68 and 77 degrees F). Throw away any unused drug after the expiration date. NOTE: This sheet is a summary. It may not cover all possible information. If you have questions about this medicine, talk to your doctor, pharmacist, or health care provider.  2020 Elsevier/Gold Standard (2019-02-04 15:07:07)  Echocardiogram An echocardiogram is a procedure that uses painless sound waves (ultrasound) to produce an image of the heart. Images from an echocardiogram can provide important information about:  Signs of coronary artery disease (CAD).  Aneurysm detection. An aneurysm is a weak or damaged part of an artery wall that bulges out from the normal force of blood pumping through the body.  Heart size and shape. Changes in the size or shape of the heart can be associated with certain conditions, including heart failure, aneurysm, and CAD.  Heart muscle function.  Heart valve function.  Signs of a past heart attack.  Fluid buildup around the heart.  Thickening of the heart muscle.  A tumor or infectious growth around the heart valves. Tell a health care provider about:  Any allergies you have.  All medicines you are taking, including vitamins, herbs, eye drops, creams, and over-the-counter medicines.  Any blood disorders you have.  Any surgeries you have had.  Any medical conditions you have.  Whether you are pregnant or may be pregnant. What are the risks? Generally, this is a safe procedure. However, problems may occur, including:  Allergic reaction to dye (contrast) that may be used during the  procedure. What happens before the procedure? No specific preparation is needed. You may eat and drink normally. What happens during the procedure?   An IV tube may be inserted into one of your veins.  You may receive contrast through this tube. A contrast is an injection that improves the quality of the pictures from your heart.  A gel will be applied to your chest.  A wand-like tool (transducer) will be moved over your chest. The gel will help to transmit the sound waves from the transducer.  The sound waves will harmlessly bounce off of your heart to allow the heart images to be captured in real-time motion. The images will be recorded on a computer. The procedure may vary among health care providers and hospitals. What happens after the procedure?  You may return to your normal, everyday life, including diet, activities, and medicines, unless your health  care provider tells you not to do that. Summary  An echocardiogram is a procedure that uses painless sound waves (ultrasound) to produce an image of the heart.  Images from an echocardiogram can provide important information about the size and shape of your heart, heart muscle function, heart valve function, and fluid buildup around your heart.  You do not need to do anything to prepare before this procedure. You may eat and drink normally.  After the echocardiogram is completed, you may return to your normal, everyday life, unless your health care provider tells you not to do that. This information is not intended to replace advice given to you by your health care provider. Make sure you discuss any questions you have with your health care provider. Document Revised: 10/15/2018 Document Reviewed: 07/27/2016 Elsevier Patient Education  [place].

## 2020-02-25 NOTE — Progress Notes (Signed)
Cardiology Office Note:    Date:  02/25/2020   ID:  Kelsey, [name] 26-Apr-1976, MRN [id number]  PCP:  Kelsey Minium, MD  Cardiologist:  No primary care provider on file.  Electrophysiologist:  None   Referring MD: Kelsey Minium, MD   Chief Complaint  Patient presents with  . Palpitations  . Shortness of Breath    History of Present Illness:    Kelsey [name] is a 44 y.o. female with a hx of family history of long QT syndrome with a brother who died acutely after his alarm clock sounded, this was how the family got diagnosed.  The patient tells me she followed with the [facility] for many years and I was able to review this record in 2011 she was discharged from the [facility] long QT clinic with a diagnosis of sinus arrhythmia.  At that time her QTC was 383.  She was taken off beta-blockers as well.  Since her discharge from the [facility] the patient has not seen a cardiologist.  She notes that her visit today was on the recommendation of her PCP and counselor.  Tells me that recently she has been experiencing significant worsening episodes of palpitations.  She described these palpitations as abrupt onset of fast heartbeat that lasts usually 15 to 20 seconds.  She tells me that at times she does have some lightheadedness and dizziness but she has never passed out.  This comes and goes she says.  But she notes at times an episode can be about 10-20 in an hour or she could have nothing at all.  She also is experiencing shortness of breath and a great deal of fatigue.  She notes that the shortness of breath she thinks started when she was diagnosed with COVID-19.  She denies any chest pain.  The patient also tells me that recently she has been taking her blood pressure and she is noting that her systolic blood pressure is usually running 140s to 150 mmHg and her diastolic is usually running between 93 to 110 mmHg.  She admits that she is under a great deal  of stress but is working her way through all of this.  Past Medical History:  Diagnosis Date  . Abnormal Pap smear   . Anemia   . Bipolar disorder ([place])   . Chronic sinusitis   . Headache(784.0)   . Pyelonephritis 2008   Pylo and stones in the past  . Sinus problem    sinusitis    Past Surgical History:  Procedure Laterality Date  . CESAREAN SECTION    . CESAREAN SECTION  08/31/2011   Procedure: CESAREAN SECTION;  Surgeon: Kelsey [name] A. Kelsey Hurry, MD;  Location: [facility];  Service: Gynecology;  Laterality: [name];  Repeat cesarean section with delivery of baby   . COLPOSCOPY    . KNEE ARTHROSCOPY     right knee  . WISDOM TOOTH EXTRACTION      Current Medications: Current Meds  Medication Sig  . Betamethasone Valerate 0.12 % foam Apply 1 application topically daily.  [name] (MIRENA) 20 MCG/24HR IUD 1 each by Intrauterine route once.  . ondansetron (ZOFRAN ODT) 8 MG disintegrating tablet Take 1 tablet (8 mg total) by mouth every 8 (eight) hours as needed for nausea or vomiting.  . pantoprazole (PROTONIX) 40 MG tablet Take 1 tablet (40 mg total) by mouth daily.  . valACYclovir (VALTREX) 500 MG tablet   . vortioxetine HBr (TRINTELLIX) 10 MG TABS tablet  Take 1 tablet (10 mg total) by mouth daily.     Allergies:   Morphine and related, Sulfa antibiotics, and Oseltamivir   Social History   Socioeconomic History  . Marital status: Married    Spouse name: Not on file  . Number of children: 2  . Years of education: Not on file  . Highest education level: Not on file  Occupational History  . 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  Tobacco Use  . Smoking status: Never Smoker  . Smokeless tobacco: Never Used  Substance and Sexual Activity  . Alcohol use: No  . Drug use: No  . Sexual activity: Yes  Other Topics Concern  . Not on file  Social History Narrative   1 daughter--[name] born 12/09, 1 step son   Social Determinants of Health    Financial Resource Strain:   . Difficulty of Paying Living Expenses: Not on file  Food Insecurity:   . Worried About Charity fundraiser in the Last Year: Not on file  . Ran Out of Food in the Last Year: Not on file  Transportation Needs:   . Lack of Transportation (Medical): Not on file  . Lack of Transportation (Non-Medical): Not on file  Physical Activity:   . Days of Exercise per Week: Not on file  . Minutes of Exercise per Session: Not on file  Stress:   . Feeling of Stress : Not on file  Social Connections:   . Frequency of Communication with Friends and Family: Not on file  . Frequency of Social Gatherings with Friends and Family: Not on file  . Attends Religious Services: Not on file  . Active Member of Clubs or Organizations: Not on file  . Attends Archivist Meetings: Not on file  . Marital Status: Not on file     Family History: The patient's family history includes Asthma in her brother; Coronary artery disease in her maternal grandfather; Diabetes in her maternal grandfather; Heart disease in her brother and mother; Heart disease (age of onset: 90) in her maternal grandfather; Kidney disease in her maternal grandfather. There is no history of Cancer, Stroke, or Hypertension.  ROS:   Review of Systems  Constitution: Negative for decreased appetite, fever and weight gain.  HENT: Negative for congestion, ear discharge, hoarse voice and sore throat.   Eyes: Negative for discharge, redness, vision loss in right eye and visual halos.  Cardiovascular: Negative for chest pain, dyspnea on exertion, leg swelling, orthopnea and palpitations.  Respiratory: Negative for cough, hemoptysis, shortness of breath and snoring.   Endocrine: Negative for heat intolerance and polyphagia.  Hematologic/Lymphatic: Negative for bleeding problem. Does not bruise/bleed easily.  Skin: Negative for flushing, nail changes, rash and suspicious lesions.  Musculoskeletal: Negative for  arthritis, joint pain, muscle cramps, myalgias, neck pain and stiffness.  Gastrointestinal: Negative for abdominal pain, bowel incontinence, diarrhea and excessive appetite.  Genitourinary: Negative for decreased libido, genital sores and incomplete emptying.  Neurological: Negative for brief paralysis, focal weakness, headaches and loss of balance.  Psychiatric/Behavioral: Negative for altered mental status, depression and suicidal ideas.  Allergic/Immunologic: Negative for HIV exposure and persistent infections.    EKGs/Labs/Other Studies Reviewed:    The following studies were reviewed today:   EKG:  The ekg ordered today demonstrates sinus tachycardia, heart rate 100 bpm, QTC 441.  Recent Labs: 01/31/2020: ALT 55 02/16/2020: BUN 10; Creatinine, Ser 0.77; Hemoglobin 14.0; Platelets 238.0; Potassium 3.9; Sodium 140; TSH 2.21  Recent Lipid Panel    Component Value Date/Time   CHOL 177 02/23/2019 0842   TRIG 60.0 02/23/2019 0842   HDL 58.80 02/23/2019 0842   CHOLHDL 3 02/23/2019 0842   VLDL 12.0 02/23/2019 0842   LDLCALC 106 (H) 02/23/2019 0842    Physical Exam:    VS:  BP (!) 150/108 (BP Location: Left Arm, Patient Position: Sitting, Cuff Size: Normal)   Pulse 100   Ht 5\' 3"  (1.6 m)   Wt 145 lb (65.8 kg)   SpO2 99%   BMI 25.69 kg/m     Wt Readings from Last 3 Encounters:  02/25/20 145 lb (65.8 kg)  02/02/20 139 lb (63 kg)  01/31/20 140 lb (63.5 kg)     GEN: Well nourished, well developed in no acute distress HEENT: Normal NECK: No JVD; No carotid bruits LYMPHATICS: No lymphadenopathy CARDIAC: S1S2 noted,RRR, no murmurs, rubs, gallops RESPIRATORY:  Clear to auscultation without rales, wheezing or rhonchi  ABDOMEN: Soft, non-tender, non-distended, +bowel sounds, no guarding. EXTREMITIES: No edema, No cyanosis, no clubbing MUSCULOSKELETAL:  No deformity  SKIN: Warm and dry NEUROLOGIC:  Alert and oriented x 3, non-focal PSYCHIATRIC:  Normal affect, good  insight  ASSESSMENT:    1. Palpitations   2. Fatigue, unspecified type   3. Essential hypertension   4. Family history of long QT syndrome   5. Shortness of breath    PLAN:    I would like to rule out a cardiovascular etiology of this palpitation especially in the setting of her family history, therefore at this time I would like to placed a zio patch for 14 days.  Her QTC in the office today is normal at 441 ms.  In additon a transthoracic echocardiogram will be ordered to assess LV/RV function and any structural abnormalities. Once these testing have been performed amd reviewed further reccomendations will be made. For now, I do reccomend that the patient goes to the nearest ED if  symptoms recur.  She is hypertensive in the office and tells me that at home her blood pressure is usually where we found her today.  Therefore I am going to start the patient on atenolol 25 mg daily to help with her blood pressure.  Shortness of breath-as stated above we will get an echocardiogram.  In terms of her fatigue will get vitamin D level as well as vitamin B12.  The patient is in agreement with the above plan. The patient left the office in stable condition.  The patient will follow up in 1 month due to medication change.   Medication Adjustments/Labs and Tests Ordered: Current medicines are reviewed at length with the patient today.  Concerns regarding medicines are outlined above.  Orders Placed This Encounter  Procedures  . VITAMIN D 25 Hydroxy (Vit-D Deficiency, Fractures)  . B12  . LONG TERM MONITOR (3-14 DAYS)  . EKG 12-Lead  . ECHOCARDIOGRAM COMPLETE   Meds ordered this encounter  Medications  . atenolol (TENORMIN) 25 MG tablet    Sig: Take 1 tablet (25 mg total) by mouth daily.    Dispense:  30 tablet    Refill:  3    Patient Instructions  Medication Instructions:  Your physician has recommended you make the following change in your medication:   Take 25 mg Atenolol  daily.  *If you need a refill on your cardiac medications before your next appointment, please call your pharmacy*   Lab Work: Your physician recommends that you return for lab work in:  today. We checked your Viatmin D and Vitamin B12.  If you have labs (blood work) drawn today and your tests are completely normal, you will receive your results only by: Marland Kitchen MyChart Message (if you have MyChart) OR . A paper copy in the mail If you have any lab test that is abnormal or we need to change your treatment, we will call you to review the results.   Testing/Procedures: Your physician has requested that you have an echocardiogram. Echocardiography is a painless test that uses sound waves to create images of your heart. It provides your doctor with information about the size and shape of your heart and how well your heart's chambers and valves are working. This procedure takes approximately one hour. There are no restrictions for this procedure.   WHY IS MY DOCTOR PRESCRIBING ZIO? The Zio system is proven and trusted by physicians to detect and diagnose irregular heart rhythms -- and has been prescribed to hundreds of thousands of patients.  The FDA has cleared the Zio system to monitor for many different kinds of irregular heart rhythms. In a study, physicians were able to reach a diagnosis 90% of the time with the Zio system1.  You can wear the Zio monitor -- a small, discreet, comfortable patch -- during your normal day-to-day activity, including while you sleep, shower, and exercise, while it records every single heartbeat for analysis.  1Barrett, P., et al. Comparison of 24 Hour Holter Monitoring Versus 14 Day Novel Adhesive Patch Electrocardiographic Monitoring. [place], 2014.  ZIO VS. HOLTER MONITORING The Zio monitor can be comfortably worn for up to 14 days. Holter monitors can be worn for 24 to 48 hours, limiting the time to record any irregular heart rhythms you may  have. Zio is able to capture data for the 51% of patients who have their first symptom-triggered arrhythmia after 48 hours.1  LIVE WITHOUT RESTRICTIONS The Zio ambulatory cardiac monitor is a small, unobtrusive, and water-resistant patch--you might even forget you're wearing it. The Zio monitor records and stores every beat of your heart, whether you're sleeping, working out, or showering.  Wear the monitor for 14 days, remove 03/10/2020.   Follow-Up: At [facility], you and your health needs are our priority.  As part of our continuing mission to provide you with exceptional heart care, we have created designated Provider Care Teams.  These Care Teams include your primary Cardiologist (physician) and Advanced Practice Providers (APPs -  Physician Assistants and Nurse Practitioners) who all work together to provide you with the care you need, when you need it.  We recommend signing up for the patient portal called "MyChart".  Sign up information is provided on this After Visit Summary.  MyChart is used to connect with patients for Virtual Visits (Telemedicine).  Patients are able to view lab/test results, encounter notes, upcoming appointments, etc.  Non-urgent messages can be sent to your provider as well.   To learn more about what you can do with MyChart, go to [email or web address].    Your next appointment:   1 month(s)  The format for your next appointment:   In Person  Provider:   Berniece Salines, DO   Other Instructions Atenolol Tablets What is this medicine? ATENOLOL (a TEN oh lole) is a beta blocker. It decreases the amount of work your heart has to do and helps your heart beat regularly. It treats high blood pressure and/or prevent chest pain (also called angina). It is also used after a heart  attack to prevent a second one. This medicine may be used for other purposes; ask your health care provider or pharmacist if you have questions. COMMON BRAND NAME(S): Tenormin What  should I tell my health care provider before I take this medicine? They need to know if you have any of these conditions:  diabetes  heart or vessel disease like slow heart rate, worsening heart failure, heart block, sick sinus syndrome or Raynaud's disease  kidney disease  lung or breathing disease, like asthma or emphysema  pheochromocytoma  thyroid disease  an unusual or allergic reaction to atenolol, other beta-blockers, medicines, foods, dyes, or preservatives  pregnant or trying to get pregnant  breast-feeding How should I use this medicine? Take this drug by mouth. Take it as directed on the prescription label at the same time every day. You can take it with or without food. If it upsets your stomach, take it with food. Keep taking it unless your health care provider tells you to stop. Talk to your health care provider about the use of this drug in children. Special care may be needed. Overdosage: If you think you have taken too much of this medicine contact a poison control center or emergency room at once. NOTE: This medicine is only for you. Do not share this medicine with others. What if I miss a dose? If you miss a dose, take it as soon as you can. If it is almost time for your next dose, take only that dose. Do not take double or extra doses. What may interact with this medicine? This medicine may interact with the following medications:  certain medicines for blood pressure, heart disease, irregular heart beat  clonidine  digoxin  diuretics  dobutamine  epinephrine  isoproterenol  NSAIDs, medicines for pain and inflammation, like ibuprofen or naproxen  reserpine This list may not describe all possible interactions. Give your health care provider a list of all the medicines, herbs, non-prescription drugs, or dietary supplements you use. Also tell them if you smoke, drink alcohol, or use illegal drugs. Some items may interact with your medicine. What should  I watch for while using this medicine? Visit your doctor or health care professional for regular check ups. Check your blood pressure and pulse rate regularly. Ask your health care professional what your blood pressure and pulse rate should be, and when you should contact him or her. You may get drowsy or dizzy. Do not drive, use machinery, or do anything that needs mental alertness until you know how this medicine affects you. Do not stand or sit up quickly. Alcohol may interfere with the effect of this medicine. Avoid alcoholic drinks. This medicine may increase blood sugar. Ask your healthcare provider if changes in diet or medicines are needed if you have diabetes. Do not treat yourself for coughs, colds, or pain while you are taking this medicine without asking your doctor or health care professional for advice. Some ingredients may increase your blood pressure. What side effects may I notice from receiving this medicine? Side effects that you should report to your doctor or health care professional as soon as possible:  allergic reactions like skin rash, itching or hives, swelling of the face, lips, or tongue  breathing problems  changes in vision  chest pain  cold, tingling, or numb hands or feet  depression  fast, irregular heartbeat  feeling faint or lightheaded, falls  fever with sore throat  rapid weight gain   signs and symptoms of high blood  sugar such as being more thirsty or hungry or having to urinate more than normal. You may also feel very tired or have blurry vision.  swollen ankles, legs Side effects that usually do not require medical attention (report to your doctor or health care professional if they continue or are bothersome):  anxiety, nervous  diarrhea  dry skin  change in sex drive or performance  headache  nightmares or trouble sleeping  short term memory loss  stomach upset  unusually tired This list may not describe all possible side  effects. Call your doctor for medical advice about side effects. You may report side effects to FDA at 1-800-FDA-1088. Where should I keep my medicine? Keep out of the reach of children and pets. Store at room temperature between 20 and 25 degrees C (68 and 77 degrees F). Throw away any unused drug after the expiration date. NOTE: This sheet is a summary. It may not cover all possible information. If you have questions about this medicine, talk to your doctor, pharmacist, or health care provider.  2020 Elsevier/Gold Standard (2019-02-04 15:07:07)  Echocardiogram An echocardiogram is a procedure that uses painless sound waves (ultrasound) to produce an image of the heart. Images from an echocardiogram can provide important information about:  Signs of coronary artery disease (CAD).  Aneurysm detection. An aneurysm is a weak or damaged part of an artery wall that bulges out from the normal force of blood pumping through the body.  Heart size and shape. Changes in the size or shape of the heart can be associated with certain conditions, including heart failure, aneurysm, and CAD.  Heart muscle function.  Heart valve function.  Signs of a past heart attack.  Fluid buildup around the heart.  Thickening of the heart muscle.  A tumor or infectious growth around the heart valves. Tell a health care provider about:  Any allergies you have.  All medicines you are taking, including vitamins, herbs, eye drops, creams, and over-the-counter medicines.  Any blood disorders you have.  Any surgeries you have had.  Any medical conditions you have.  Whether you are pregnant or may be pregnant. What are the risks? Generally, this is a safe procedure. However, problems may occur, including:  Allergic reaction to dye (contrast) that may be used during the procedure. What happens before the procedure? No specific preparation is needed. You may eat and drink normally. What happens during the  procedure?   An IV tube may be inserted into one of your veins.  You may receive contrast through this tube. A contrast is an injection that improves the quality of the pictures from your heart.  A gel will be applied to your chest.  A wand-like tool (transducer) will be moved over your chest. The gel will help to transmit the sound waves from the transducer.  The sound waves will harmlessly bounce off of your heart to allow the heart images to be captured in real-time motion. The images will be recorded on a computer. The procedure may vary among health care providers and hospitals. What happens after the procedure?  You may return to your normal, everyday life, including diet, activities, and medicines, unless your health care provider tells you not to do that. Summary  An echocardiogram is a procedure that uses painless sound waves (ultrasound) to produce an image of the heart.  Images from an echocardiogram can provide important information about the size and shape of your heart, heart muscle function, heart valve function, and fluid  buildup around your heart.  You do not need to do anything to prepare before this procedure. You may eat and drink normally.  After the echocardiogram is completed, you may return to your normal, everyday life, unless your health care provider tells you not to do that. This information is not intended to replace advice given to you by your health care provider. Make sure you discuss any questions you have with your health care provider. Document Revised: 10/15/2018 Document Reviewed: 07/27/2016 Elsevier Patient Education  [place].      Adopting a Healthy Lifestyle.  Know what a healthy weight is for you (roughly BMI <25) and aim to maintain this   Aim for 7+ servings of fruits and vegetables daily   65-80+ fluid ounces of water or unsweet tea for healthy kidneys   Limit to max 1 drink of alcohol per day; avoid smoking/tobacco    Limit animal fats in diet for cholesterol and heart health - choose grass fed whenever available   Avoid highly processed foods, and foods high in saturated/trans fats   Aim for low stress - take time to unwind and care for your mental health   Aim for 150 min of moderate intensity exercise weekly for heart health, and weights twice weekly for bone health   Aim for 7-9 hours of sleep daily   When it comes to diets, agreement about the perfect plan isnt easy to find, even among the experts. Experts at the [employer] developed an idea known as the Healthy Eating Plate. Just imagine a plate divided into logical, healthy portions.   The emphasis is on diet quality:   Load up on vegetables and fruits - one-half of your plate: Aim for color and variety, and remember that potatoes dont count.   Go for whole grains - one-quarter of your plate: Whole wheat, barley, wheat berries, quinoa, oats, brown rice, and foods made with them. If you want pasta, go with whole wheat pasta.   Protein power - one-quarter of your plate: Fish, chicken, beans, and nuts are all healthy, versatile protein sources. Limit red meat.   The diet, however, does go beyond the plate, offering a few other suggestions.   Use healthy plant oils, such as olive, canola, soy, corn, sunflower and peanut. Check the labels, and avoid partially hydrogenated oil, which have unhealthy trans fats.   If youre thirsty, drink water. Coffee and tea are good in moderation, but skip sugary drinks and limit milk and dairy products to one or two daily servings.   The type of carbohydrate in the diet is more important than the amount. Some sources of carbohydrates, such as vegetables, fruits, whole grains, and beans-are healthier than others.   Finally, stay active  Signed, Berniece Salines, DO  02/25/2020 9:53 AM    [facility]

## 2020-02-26 LAB — VITAMIN B12: Vitamin B-12: 534 pg/mL (ref 232–1245)

## 2020-02-26 LAB — VITAMIN D 25 HYDROXY (VIT D DEFICIENCY, FRACTURES): Vit D, 25-Hydroxy: 31.5 ng/mL (ref 30.0–100.0)

## 2020-02-28 ENCOUNTER — Encounter: Payer: Self-pay | Admitting: General Practice

## 2020-02-28 NOTE — Telephone Encounter (Signed)
Called pt could not leave a VM. Sent a mychart to inquire what disability was for. We have not received any forms and there are none sent into scanning.

## 2020-02-28 NOTE — Telephone Encounter (Signed)
FYI

## 2020-02-28 NOTE — Telephone Encounter (Signed)
As of now, no paperwork received

## 2020-02-29 ENCOUNTER — Telehealth: Payer: Self-pay | Admitting: Family Medicine

## 2020-02-29 NOTE — Telephone Encounter (Signed)
I have placed forms from [employer] in the bin up front with a charge sheet,   Please advise and they are asking for medical records to be sent in the forms.

## 2020-02-29 NOTE — Telephone Encounter (Signed)
Paperwork given to PCP for review.  

## 2020-03-03 ENCOUNTER — Ambulatory Visit (HOSPITAL_COMMUNITY)
Admission: RE | Admit: 2020-03-03 | Discharge: 2020-03-03 | Disposition: A | Discharge status: Home / Self Care | Payer: PRIVATE HEALTH INSURANCE | Source: Ambulatory Visit | Attending: Cardiology | Admitting: Cardiology

## 2020-03-03 ENCOUNTER — Other Ambulatory Visit: Payer: Self-pay

## 2020-03-03 DIAGNOSIS — I10 Essential (primary) hypertension: Secondary | ICD-10-CM | POA: Insufficient documentation

## 2020-03-03 DIAGNOSIS — R0600 Dyspnea, unspecified: Secondary | ICD-10-CM | POA: Diagnosis not present

## 2020-03-03 DIAGNOSIS — Z8616 Personal history of COVID-19: Secondary | ICD-10-CM | POA: Diagnosis not present

## 2020-03-03 DIAGNOSIS — R5383 Other fatigue: Secondary | ICD-10-CM

## 2020-03-03 DIAGNOSIS — R002 Palpitations: Secondary | ICD-10-CM

## 2020-03-03 LAB — ECHOCARDIOGRAM COMPLETE
Area-P 1/2: 5.02 cm2
Calc EF: 51.1 %
S' Lateral: 3.4 cm
Single Plane A2C EF: 56.7 %
Single Plane A4C EF: 51.1 %

## 2020-03-03 NOTE — Progress Notes (Signed)
  Echocardiogram 2D Echocardiogram has been performed.  Kelsey [name] 03/03/2020, 9:05 AM

## 2020-03-06 ENCOUNTER — Other Ambulatory Visit (HOSPITAL_BASED_OUTPATIENT_CLINIC_OR_DEPARTMENT_OTHER): Payer: PRIVATE HEALTH INSURANCE

## 2020-03-06 DIAGNOSIS — Z0279 Encounter for issue of other medical certificate: Secondary | ICD-10-CM

## 2020-03-06 NOTE — Telephone Encounter (Signed)
FYI

## 2020-03-06 NOTE — Telephone Encounter (Signed)
Form completed and placed in basket  

## 2020-03-07 NOTE — Telephone Encounter (Signed)
Faxed form.

## 2020-03-16 ENCOUNTER — Other Ambulatory Visit: Payer: Self-pay

## 2020-03-16 ENCOUNTER — Encounter: Payer: Self-pay | Admitting: Family Medicine

## 2020-03-16 ENCOUNTER — Ambulatory Visit (INDEPENDENT_AMBULATORY_CARE_PROVIDER_SITE_OTHER): Payer: PRIVATE HEALTH INSURANCE | Admitting: Family Medicine

## 2020-03-16 VITALS — BP 122/81 | HR 86 | Temp 98.2°F | Resp 16 | Ht 63.0 in | Wt 151.4 lb

## 2020-03-16 DIAGNOSIS — R768 Other specified abnormal immunological findings in serum: Secondary | ICD-10-CM | POA: Diagnosis not present

## 2020-03-16 DIAGNOSIS — R0602 Shortness of breath: Secondary | ICD-10-CM | POA: Diagnosis not present

## 2020-03-16 DIAGNOSIS — R002 Palpitations: Secondary | ICD-10-CM | POA: Diagnosis not present

## 2020-03-16 DIAGNOSIS — Z8616 Personal history of COVID-19: Secondary | ICD-10-CM | POA: Diagnosis not present

## 2020-03-16 NOTE — Patient Instructions (Signed)
Schedule your complete physical in 3 months We'll call you with your Rheumatology appt Call with any questions or concerns Keep making progress!!!

## 2020-03-16 NOTE — Progress Notes (Signed)
   Subjective:    Patient ID: Edilia Bo, female    DOB: Apr 14, 1976, 44 y.o.   MRN: [id number]  HPI Hx of COVID- pt reports fatigue is improving.  She is able to be upright and move about some.  SOB continues to come and go- some are related to her palpitations.  Cough has returned to baseline pre-COVID sxs.  Occasional dizziness.  Able to eat and drink.  Pt is choosing to not be vaccinated.  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  Wore a holter x2 weeks.  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  Did see [facility] in 2019.  Since then has felt sxs were progressing- increased joint pain, fatigue.  Needs new referral for evaluation/tx options.   Review of Systems For ROS see HPI   This visit occurred during the SARS-CoV-2 public health emergency.  Safety protocols were in place, including screening questions prior to the visit, additional usage of staff PPE, and extensive cleaning of exam room while observing appropriate contact time as indicated for disinfecting solutions.       Objective:   Physical Exam Vitals reviewed.  Constitutional:      General: She is not in acute distress.    Appearance: Normal appearance. She is well-developed.  HENT:     Head: Normocephalic and atraumatic.  Eyes:     Conjunctiva/sclera: Conjunctivae normal.     Pupils: Pupils are equal, round, and reactive to light.  Neck:     Thyroid: No thyromegaly.  Cardiovascular:     Rate and Rhythm: Normal rate and regular rhythm.     Heart sounds: Normal heart sounds. No murmur heard.   Pulmonary:     Effort: Pulmonary effort is normal. No respiratory distress.     Breath sounds: Normal breath sounds.  Abdominal:     General: There is no distension.     Palpations: Abdomen is soft.     Tenderness: There is no abdominal tenderness.  Musculoskeletal:     Cervical back: Normal range of motion and neck supple.  Lymphadenopathy:      Cervical: No cervical adenopathy.  Skin:    General: Skin is warm and dry.  Neurological:     Mental Status: She is alert and oriented to person, place, and time.  Psychiatric:        Mood and Affect: Mood normal.        Behavior: Behavior normal.        Thought Content: Thought content normal.           Assessment & Plan:

## 2020-03-20 ENCOUNTER — Telehealth: Payer: Self-pay | Admitting: Family Medicine

## 2020-03-20 NOTE — Telephone Encounter (Signed)
Paperwork given to PCP.  

## 2020-03-20 NOTE — Telephone Encounter (Signed)
Patient called stating that she had spoken with Danae Chen in regard to a referral to rheumatology.   States she has reached out to Dr. Trudie Reed office in regard to her insurance. States that [place] from Dr. Trudie Reed office informed her that they process to get in network with patients insurance group.   States that [place] informed her that she may need referral to be sent over to office.

## 2020-03-20 NOTE — Telephone Encounter (Signed)
I have placed short term disability form in the bin up front with a charge sheet.

## 2020-03-21 NOTE — Telephone Encounter (Signed)
Notes printed and the referral has been faxed. Thank you for completing the notes.

## 2020-03-21 NOTE — Assessment & Plan Note (Signed)
Fatigue is improving, she continues to have occasional dizziness.  She is choosing not to be vaccinated despite recent infxn.  Will follow for long haul sxs

## 2020-03-21 NOTE — Telephone Encounter (Signed)
Visit note complete

## 2020-03-21 NOTE — Assessment & Plan Note (Signed)
In the midst of Cardiology work up.  Wore a holter x2 weeks- waiting on results.  She does have a hx of QT prolongation- was told by Cards this may be re-emerging.  Will follow along.

## 2020-03-21 NOTE — Telephone Encounter (Signed)
Can you please have Dr. Birdie Riddle close the last office visit so I can fax over the referral. Thanks.

## 2020-03-21 NOTE — Assessment & Plan Note (Signed)
Pt had + RF and saw Rheum at [facility] in 2019.  Since then feels sxs have been progressing due to increased joint pain and fatigue.  New referral placed.

## 2020-03-21 NOTE — Assessment & Plan Note (Signed)
Improved.  Likely a combo of COVID infxn and palpitations.  Will follow along as she continues cards work up.

## 2020-04-14 ENCOUNTER — Ambulatory Visit: Payer: PRIVATE HEALTH INSURANCE | Admitting: Cardiology

## 2020-04-19 ENCOUNTER — Ambulatory Visit (INDEPENDENT_AMBULATORY_CARE_PROVIDER_SITE_OTHER): Payer: PRIVATE HEALTH INSURANCE | Admitting: Cardiology

## 2020-04-19 ENCOUNTER — Other Ambulatory Visit: Payer: Self-pay

## 2020-04-19 ENCOUNTER — Telehealth: Payer: Self-pay | Admitting: Emergency Medicine

## 2020-04-19 ENCOUNTER — Telehealth: Payer: Self-pay | Admitting: Cardiology

## 2020-04-19 ENCOUNTER — Encounter: Payer: Self-pay | Admitting: Cardiology

## 2020-04-19 VITALS — BP 140/100 | HR 102 | Ht 63.0 in | Wt 149.0 lb

## 2020-04-19 DIAGNOSIS — R002 Palpitations: Secondary | ICD-10-CM

## 2020-04-19 DIAGNOSIS — R400 Somnolence: Secondary | ICD-10-CM | POA: Diagnosis not present

## 2020-04-19 DIAGNOSIS — R0602 Shortness of breath: Secondary | ICD-10-CM

## 2020-04-19 DIAGNOSIS — R5383 Other fatigue: Secondary | ICD-10-CM

## 2020-04-19 DIAGNOSIS — Z8616 Personal history of COVID-19: Secondary | ICD-10-CM

## 2020-04-19 DIAGNOSIS — I493 Ventricular premature depolarization: Secondary | ICD-10-CM | POA: Insufficient documentation

## 2020-04-19 MED ORDER — VITAMIN D (ERGOCALCIFEROL) 1.25 MG (50000 UNIT) PO CAPS
50000.0000 [IU] | ORAL_CAPSULE | ORAL | 0 refills | Status: DC
Start: 2020-04-19 — End: 2020-10-19

## 2020-04-19 MED ORDER — ATENOLOL 50 MG PO TABS: 50.0000 mg | ORAL_TABLET | Freq: Every day | ORAL | 1 refills | Status: DC

## 2020-04-19 NOTE — Progress Notes (Signed)
Cardiology Office Note:    Date:  04/19/2020   ID:  Kelsey [name], DOB 1976-02-25, MRN [id number]  PCP:  Midge Minium, MD  Cardiologist:  No primary care provider on file.  Electrophysiologist:  None   Referring MD: Midge Minium, MD   Chief Complaint  Patient presents with  . Follow-up    History of Present Illness:    Kelsey [name] is a 44 y.o. female with a hx of family history of long QT syndrome with a brother who died acutely after his alarm clock sounded, this was how the family got diagnosed.  The patient tells me she followed with the [facility] for many years and I was able to review this record in 2011 she was discharged from the [facility] long QT clinic with a diagnosis of sinus arrhythmia.  At that time her QTC was 383.  She was taken off beta-blockers as well.  Since her discharge from [facility] in 2011 she has not seen a cardiologist.  She has been following her PCP.  I did see the patient initially on February 25, 2020 at that time she was reporting palpitations as well as [place] of dizziness.  Today today visit I placed a monitor and the patient and got an echocardiogram to reassess her LV function.  Due to being hypertensive I started the patient on atenolol 25 mg daily.  Blood work was done due to her fatigue for vitamin B12 as well as vitamin D levels.  In the interim she was able to wear her ZIO monitor as well as get an echocardiogram.  Patient tells me that she still is experiencing significant palpitations and 25 her atenolol has not helped.  The fatigue is getting worse as well as the daytime somnolence.  She feels significantly sleepy during the day.  No chest pain, there is intermittent shortness of breath.  Past Medical History:  Diagnosis Date  . Abnormal Pap smear   . Anemia   . Bipolar disorder ([place])   . Chronic sinusitis   . Headache(784.0)   . Pyelonephritis 2008   Pylo and stones in the past  . Sinus problem     sinusitis    Past Surgical History:  Procedure Laterality Date  . CESAREAN SECTION    . CESAREAN SECTION  08/31/2011   Procedure: CESAREAN SECTION;  Surgeon: [name], MD;  Location: [facility];  Service: Gynecology;  Laterality: [name];  Repeat cesarean section with delivery of baby   . COLPOSCOPY    . KNEE ARTHROSCOPY     right knee  . WISDOM TOOTH EXTRACTION      Current Medications: Current Meds  Medication Sig  . atenolol (TENORMIN) 50 MG tablet Take 1 tablet (50 mg total) by mouth daily.  . Betamethasone Valerate 0.12 % foam Apply topically 2 (two) times daily.  [name] (MIRENA) 20 MCG/24HR IUD 1 each by Intrauterine route once.  . [DISCONTINUED] atenolol (TENORMIN) 25 MG tablet Take 1 tablet (25 mg total) by mouth daily.     Allergies:   Morphine and related, Sulfa antibiotics, and Oseltamivir   Social History   Socioeconomic History  . Marital status: Married    Spouse name: Not on file  . Number of children: 2  . Years of education: Not on file  . Highest education level: Not on file  Occupational History  . 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  Tobacco Use  . Smoking status: Never  Smoker  . Smokeless tobacco: Never Used  Substance and Sexual Activity  . Alcohol use: No  . Drug use: No  . Sexual activity: Yes  Other Topics Concern  . Not on file  Social History Narrative   1 daughter--[name] born 12/09, 1 step son   Social Determinants of Health   Financial Resource Strain:   . Difficulty of Paying Living Expenses: Not on file  Food Insecurity:   . Worried About Charity fundraiser in the Last Year: Not on file  . Ran Out of Food in the Last Year: Not on file  Transportation Needs:   . Lack of Transportation (Medical): Not on file  . Lack of Transportation (Non-Medical): Not on file  Physical Activity:   . Days of Exercise per Week: Not on file  . Minutes of Exercise per Session: Not on file  Stress:   .  Feeling of Stress : Not on file  Social Connections:   . Frequency of Communication with Friends and Family: Not on file  . Frequency of Social Gatherings with Friends and Family: Not on file  . Attends Religious Services: Not on file  . Active Member of Clubs or Organizations: Not on file  . Attends Archivist Meetings: Not on file  . Marital Status: Not on file     Family History: The patient's family history includes Asthma in her brother; Coronary artery disease in her maternal grandfather; Diabetes in her maternal grandfather; Heart disease in her brother and mother; Heart disease (age of onset: 11) in her maternal grandfather; Kidney disease in her maternal grandfather. There is no history of Cancer, Stroke, or Hypertension.  ROS:   Review of Systems  Constitution: Negative for decreased appetite, fever and weight gain.  HENT: Negative for congestion, ear discharge, hoarse voice and sore throat.   Eyes: Negative for discharge, redness, vision loss in right eye and visual halos.  Cardiovascular: Negative for chest pain, dyspnea on exertion, leg swelling, orthopnea and palpitations.  Respiratory: Negative for cough, hemoptysis, shortness of breath and snoring.   Endocrine: Negative for heat intolerance and polyphagia.  Hematologic/Lymphatic: Negative for bleeding problem. Does not bruise/bleed easily.  Skin: Negative for flushing, nail changes, rash and suspicious lesions.  Musculoskeletal: Negative for arthritis, joint pain, muscle cramps, myalgias, neck pain and stiffness.  Gastrointestinal: Negative for abdominal pain, bowel incontinence, diarrhea and excessive appetite.  Genitourinary: Negative for decreased libido, genital sores and incomplete emptying.  Neurological: Negative for brief paralysis, focal weakness, headaches and loss of balance.  Psychiatric/Behavioral: Negative for altered mental status, depression and suicidal ideas.  Allergic/Immunologic: Negative for  HIV exposure and persistent infections.    EKGs/Labs/Other Studies Reviewed:    The following studies were reviewed today:   EKG:  The ekg ordered today demonstrates   2D echo IMPRESSIONS  1. Left ventricular ejection fraction, by estimation, is 55 to 60%. The left ventricle has normal function. The left ventricle has no regional wall motion abnormalities. Left ventricular diastolic parameters were normal. The average left ventricular global longitudinal strain is -16.7 %.  2. Right ventricular systolic function is normal. The right ventricular size is normal. There is normal pulmonary artery systolic pressure.  3. The mitral valve is normal in structure. Trivial mitral valve regurgitation. No evidence of mitral stenosis.  4. The aortic valve is normal in structure. Aortic valve regurgitation is not visualized.   FINDINGS  Left Ventricle: Left ventricular ejection fraction, by estimation, is 55  to 60%.  The left ventricle has normal function. The left ventricle has no  regional wall motion abnormalities. The average left ventricular global  longitudinal strain is -16.7 %.  The left ventricular internal cavity size was normal in size. There is no  left ventricular hypertrophy. Left ventricular diastolic parameters were  normal.   Right Ventricle: The right ventricular size is normal. No increase in  right ventricular wall thickness. Right ventricular systolic function is  normal. There is normal pulmonary artery systolic pressure. The tricuspid  regurgitant velocity is 2.07 m/s, and  with an assumed right atrial pressure of 3 mmHg, the estimated right  ventricular systolic pressure is 67.1 mmHg.   Left Atrium: Left atrial size was normal in size.   Right Atrium: Right atrial size was normal in size.   Pericardium: There is no evidence of pericardial effusion.   Mitral Valve: The mitral valve is normal in structure. Trivial mitral  valve regurgitation. No evidence of mitral  valve stenosis.   Tricuspid Valve: The tricuspid valve is normal in structure. Tricuspid  valve regurgitation is trivial.   Aortic Valve: The aortic valve is normal in structure. Aortic valve  regurgitation is not visualized.   Pulmonic Valve: The pulmonic valve was grossly normal. Pulmonic valve  regurgitation is not visualized. No evidence of pulmonic stenosis.   Aorta: The aortic root and ascending aorta are structurally normal, with  no evidence of dilitation.   IAS/Shunts: The atrial septum is grossly normal.   ZIO monitor The patient wore the monitor for 13 days 23 hours starting February 25, 2020.. Indication: Palpitations  The minimum heart rate was 55 bpm, maximum heart rate was 161 bpm, and average heart rate was 94 bpm. Predominant underlying rhythm was Sinus Rhythm.   Premature atrial complexes were rare less than 1%. Premature Ventricular complexes rare less than 1%.  No triggered tachycardia, no pauses, No AV block, no supraventricular and no atrial fibrillation present.  100 patient triggered events and 50 diary events most of which were associated with premature ventricular complex.  Conclusion: This study is remarkable for symptomatic rare premature ventricular complex.   Recent Labs: 01/31/2020: ALT 55 02/16/2020: BUN 10; Creatinine, Ser 0.77; Hemoglobin 14.0; Platelets 238.0; Potassium 3.9; Sodium 140; TSH 2.21  Recent Lipid Panel    Component Value Date/Time   CHOL 177 02/23/2019 0842   TRIG 60.0 02/23/2019 0842   HDL 58.80 02/23/2019 0842   CHOLHDL 3 02/23/2019 0842   VLDL 12.0 02/23/2019 0842   LDLCALC 106 (H) 02/23/2019 0842    Physical Exam:    VS:  BP (!) 140/100 (BP Location: Right Arm, Patient Position: Sitting, Cuff Size: Normal)   Pulse (!) 102   Ht 5\' 3"  (1.6 m)   Wt 149 lb (67.6 kg)   SpO2 97%   BMI 26.39 kg/m     Wt Readings from Last 3 Encounters:  04/19/20 149 lb (67.6 kg)  03/16/20 151 lb 6 oz (68.7 kg)  02/25/20 145 lb  (65.8 kg)     GEN: Well nourished, well developed in no acute distress HEENT: Normal NECK: No JVD; No carotid bruits LYMPHATICS: No lymphadenopathy CARDIAC: S1S2 noted,RRR, no murmurs, rubs, gallops RESPIRATORY:  Clear to auscultation without rales, wheezing or rhonchi  ABDOMEN: Soft, non-tender, non-distended, +bowel sounds, no guarding. EXTREMITIES: No edema, No cyanosis, no clubbing MUSCULOSKELETAL:  No deformity  SKIN: Warm and dry NEUROLOGIC:  Alert and oriented x 3, non-focal PSYCHIATRIC:  Normal affect, good insight  ASSESSMENT:    1.  Daytime somnolence   2. Palpitations   3. History of COVID-19   4. Shortness of breath   5. Fatigue, unspecified type   6. Symptomatic PVCs    PLAN:     We discussed her testing results.  All of her answers have been answered.  Her ZIO monitor ago shows symptomatic rare PVCs.  She is still having symptoms of palpitations and this is getting worse therefore I am going to increase her atenolol to 50 mg daily.  This will also help with her blood pressure which is also elevated in the office.  She still has fatigue her vitamin D level is low normal at 31.5 but I would like like to do is treat the patient given the symptoms of fatigue with 50,000 units once weekly for 8 weeks for vitamin D and see if this helps.  She has had significant daytime somnolence coupled with the fatigue it will be beneficial to have the patient undergo a sleep study to make sure sleep apnea is not playing a role.  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  She has been started on Celexa.  I have asked the patient to see if her psychiatrist can start at a lower dose of 10 mg and after that she can come in 2 weeks later to get her EKG done for QTC monitoring.  She expresses understanding.  She still is short of breath.  We will have to reassess this if this continues the patient does have history of underlying asthma and I will  forward my note to her PCP and she may need to do a trial of inhalers and see if this will help.  If not a PFT may also be appropriate along with a pulmonary consultation.  The patient is in agreement with the above plan. The patient left the office in stable condition.  The patient will follow up in 8 weeks or sooner if needed.   Medication Adjustments/Labs and Tests Ordered: Current medicines are reviewed at length with the patient today.  Concerns regarding medicines are outlined above.  Orders Placed This Encounter  Procedures  . Split night study   Meds ordered this encounter  Medications  . Vitamin D, Ergocalciferol, (DRISDOL) 1.25 MG (50000 UNIT) CAPS capsule    Sig: Take 1 capsule (50,000 Units total) by mouth every 7 (seven) days. For 8 weeks.    Dispense:  8 capsule    Refill:  0  . atenolol (TENORMIN) 50 MG tablet    Sig: Take 1 tablet (50 mg total) by mouth daily.    Dispense:  30 tablet    Refill:  1    Patient Instructions  Medication Instructions:  Your physician has recommended you make the following change in your medication:   START: Vitamin D 50,000 units once every 7 days for 8 weeks  *If you need a refill on your cardiac medications before your next appointment, please call your pharmacy*  INCREASE: Atenolol 50 mg daily   Lab Work: None.  If you have labs (blood work) drawn today and your tests are completely normal, you will receive your results only by: Marland Kitchen MyChart Message (if you have MyChart) OR . A paper copy in the mail If you have any lab test that is abnormal or we need to change your treatment, we will call you to review the results.   Testing/Procedures: Your physician has recommended that you have a sleep study. This  test records several body functions during sleep, including: brain activity, eye movement, oxygen and carbon dioxide blood levels, heart rate and rhythm, breathing rate and rhythm, the flow of air through your mouth and nose,  snoring, body muscle movements, and chest and belly movement.     Follow-Up: At [facility], you and your health needs are our priority.  As part of our continuing mission to provide you with exceptional heart care, we have created designated Provider Care Teams.  These Care Teams include your primary Cardiologist (physician) and Advanced Practice Providers (APPs -  Physician Assistants and Nurse Practitioners) who all work together to provide you with the care you need, when you need it.  We recommend signing up for the patient portal called "MyChart".  Sign up information is provided on this After Visit Summary.  MyChart is used to connect with patients for Virtual Visits (Telemedicine).  Patients are able to view lab/test results, encounter notes, upcoming appointments, etc.  Non-urgent messages can be sent to your provider as well.   To learn more about what you can do with MyChart, go to [email or web address].    Your next appointment:   8 week(s)  The format for your next appointment:   In Person  Provider:   Berniece Salines, DO   Other Instructions   Sleep Studies A sleep study (polysomnogram) is a series of tests done while you are sleeping. A sleep study records your brain waves, heart rate, breathing rate, oxygen level, and eye and leg movements. A sleep study helps your health care provider:  See how well you sleep.  Diagnose a sleep disorder.  Determine how severe your sleep disorder is.  Create a plan to treat your sleep disorder. Your health care provider may recommend a sleep study if you:  Feel sleepy on most days.  Snore loudly while sleeping.  Have unusual behaviors while you sleep, such as walking.  Have brief periods in which you stop breathing during sleep (sleepapnea).  Fall asleep suddenly during the day (narcolepsy).  Have trouble falling asleep or staying asleep (insomnia).  Feel like you need to move your legs when trying to fall asleep  (restless legs syndrome).  Move your legs by flexing and extending them regularly while asleep (periodic limb movement disorder).  Act out your dreams while you sleep (sleep behavior disorder).  Feel like you cannot move when you first wake up (sleep paralysis). What tests are part of a sleep study? Most sleep studies record the following during sleep:  Brain activity.  Eye movements.  Heart rate and rhythm.  Breathing rate and rhythm.  Blood-oxygen level.  Blood pressure.  Chest and belly movement as you breathe.  Arm and leg movements.  Snoring or other noises.  Body position. Where are sleep studies done? Sleep studies are done at sleep centers. A sleep center may be inside a hospital, office, or clinic. The room where you have the study may look like a hospital room or a hotel room. The health care providers doing the study may come in and out of the room during the study. Most of the time, they will be in another room monitoring your test as you sleep. How are sleep studies done? Most sleep studies are done during a normal period of time for a full night of sleep. You will arrive at the study center in the evening and go home in the morning. Before the test  Bring your pajamas and toothbrush with you to the sleep  study.  Do not have caffeine on the day of your sleep study.  Do not drink alcohol on the day of your sleep study.  Your health care provider will let you know if you should stop taking any of your regular medicines before the test. During the test      Round, sticky patches with sensors attached to recording wires (electrodes) are placed on your scalp, face, chest, and limbs.  Wires from all the electrodes and sensors run from your bed to a computer. The wires can be taken off and put back on if you need to get out of bed to go to the bathroom.  A sensor is placed over your nose to measure airflow.  A finger clip is put on your finger or ear to  measure your blood oxygen level (pulse oximetry).  A belt is placed around your belly and a belt is placed around your chest to measure breathing movements.  If you have signs of the sleep disorder called sleep apnea during your test, you may get a treatment mask to wear for the second half of the night. ? The mask provides positive airway pressure (PAP) to help you breathe better during sleep. This may greatly improve your sleep apnea. ? You will then have all tests done again with the mask in place to see if your measurements and recordings change. After the test  A medical doctor who specializes in sleep will evaluate the results of your sleep study and share them with you and your primary health care provider.  Based on your results, your medical history, and a physical exam, you may be diagnosed with a sleep disorder, such as: ? Sleep apnea. ? Restless legs syndrome. ? Sleep-related behavior disorder. ? Sleep-related movement disorders. ? Sleep-related seizure disorders.  Your health care team will help determine your treatment options based on your diagnosis. This may include: ? Improving your sleep habits (sleep hygiene). ? Wearing a continuous positive airway pressure (CPAP) or bi-level positive airway pressure (BPAP) mask. ? Wearing an oral device at night to improve breathing and reduce snoring. ? Taking medicines. Follow these instructions at home:  Take over-the-counter and prescription medicines only as told by your health care provider.  If you are instructed to use a CPAP or BPAP mask, make sure you use it nightly as directed.  Make any lifestyle changes that your health care provider recommends.  If you were given a device to open your airway while you sleep, use it only as told by your health care provider.  Do not use any tobacco products, such as cigarettes, chewing tobacco, and e-cigarettes. If you need help quitting, ask your health care provider.  Keep all  follow-up visits as told by your health care provider. This is important. Summary  A sleep study (polysomnogram) is a series of tests done while you are sleeping. It shows how well you sleep.  Most sleep studies are done over one full night of sleep. You will arrive at the study center in the evening and go home in the morning.  If you have signs of the sleep disorder called sleep apnea during your test, you may get a treatment mask to wear for the second half of the night.  A medical doctor who specializes in sleep will evaluate the results of your sleep study and share them with your primary health care provider. This information is not intended to replace advice given to you by your health care provider. Make  sure you discuss any questions you have with your health care provider. Document Revised: 12/09/2018 Document Reviewed: 07/22/2017 Elsevier Patient Education  [place].      Adopting a Healthy Lifestyle.  Know what a healthy weight is for you (roughly BMI <25) and aim to maintain this   Aim for 7+ servings of fruits and vegetables daily   65-80+ fluid ounces of water or unsweet tea for healthy kidneys   Limit to max 1 drink of alcohol per day; avoid smoking/tobacco   Limit animal fats in diet for cholesterol and heart health - choose grass fed whenever available   Avoid highly processed foods, and foods high in saturated/trans fats   Aim for low stress - take time to unwind and care for your mental health   Aim for 150 min of moderate intensity exercise weekly for heart health, and weights twice weekly for bone health   Aim for 7-9 hours of sleep daily   When it comes to diets, agreement about the perfect plan isnt easy to find, even among the experts. Experts at the [employer] developed an idea known as the Healthy Eating Plate. Just imagine a plate divided into logical, healthy portions.   The emphasis is on diet quality:   Load up  on vegetables and fruits - one-half of your plate: Aim for color and variety, and remember that potatoes dont count.   Go for whole grains - one-quarter of your plate: Whole wheat, barley, wheat berries, quinoa, oats, brown rice, and foods made with them. If you want pasta, go with whole wheat pasta.   Protein power - one-quarter of your plate: Fish, chicken, beans, and nuts are all healthy, versatile protein sources. Limit red meat.   The diet, however, does go beyond the plate, offering a few other suggestions.   Use healthy plant oils, such as olive, canola, soy, corn, sunflower and peanut. Check the labels, and avoid partially hydrogenated oil, which have unhealthy trans fats.   If youre thirsty, drink water. Coffee and tea are good in moderation, but skip sugary drinks and limit milk and dairy products to one or two daily servings.   The type of carbohydrate in the diet is more important than the amount. Some sources of carbohydrates, such as vegetables, fruits, whole grains, and beans-are healthier than others.   Finally, stay active  Signed, Berniece Salines, DO  04/19/2020 8:59 AM    [place]

## 2020-04-19 NOTE — Telephone Encounter (Signed)
Called patient to inform her of sleep study appointment 05/26/20 at 8 pm.

## 2020-04-19 NOTE — Telephone Encounter (Signed)
Pre-auth is not required for sleep study with the patient's plan

## 2020-04-19 NOTE — Patient Instructions (Addendum)
Medication Instructions:  Your physician has recommended you make the following change in your medication:   START: Vitamin D 50,000 units once every 7 days for 8 weeks  *If you need a refill on your cardiac medications before your next appointment, please call your pharmacy*  INCREASE: Atenolol 50 mg daily   Lab Work: None.  If you have labs (blood work) drawn today and your tests are completely normal, you will receive your results only by: Marland Kitchen MyChart Message (if you have MyChart) OR . A paper copy in the mail If you have any lab test that is abnormal or we need to change your treatment, we will call you to review the results.   Testing/Procedures: Your physician has recommended that you have a sleep study. This test records several body functions during sleep, including: brain activity, eye movement, oxygen and carbon dioxide blood levels, heart rate and rhythm, breathing rate and rhythm, the flow of air through your mouth and nose, snoring, body muscle movements, and chest and belly movement.     Follow-Up: At [facility], you and your health needs are our priority.  As part of our continuing mission to provide you with exceptional heart care, we have created designated Provider Care Teams.  These Care Teams include your primary Cardiologist (physician) and Advanced Practice Providers (APPs -  Physician Assistants and Nurse Practitioners) who all work together to provide you with the care you need, when you need it.  We recommend signing up for the patient portal called "MyChart".  Sign up information is provided on this After Visit Summary.  MyChart is used to connect with patients for Virtual Visits (Telemedicine).  Patients are able to view lab/test results, encounter notes, upcoming appointments, etc.  Non-urgent messages can be sent to your provider as well.   To learn more about what you can do with MyChart, go to [email or web address].    Your next appointment:   8  week(s)  The format for your next appointment:   In Person  Provider:   Berniece Salines, DO   Other Instructions   Sleep Studies A sleep study (polysomnogram) is a series of tests done while you are sleeping. A sleep study records your brain waves, heart rate, breathing rate, oxygen level, and eye and leg movements. A sleep study helps your health care provider:  See how well you sleep.  Diagnose a sleep disorder.  Determine how severe your sleep disorder is.  Create a plan to treat your sleep disorder. Your health care provider may recommend a sleep study if you:  Feel sleepy on most days.  Snore loudly while sleeping.  Have unusual behaviors while you sleep, such as walking.  Have brief periods in which you stop breathing during sleep (sleepapnea).  Fall asleep suddenly during the day (narcolepsy).  Have trouble falling asleep or staying asleep (insomnia).  Feel like you need to move your legs when trying to fall asleep (restless legs syndrome).  Move your legs by flexing and extending them regularly while asleep (periodic limb movement disorder).  Act out your dreams while you sleep (sleep behavior disorder).  Feel like you cannot move when you first wake up (sleep paralysis). What tests are part of a sleep study? Most sleep studies record the following during sleep:  Brain activity.  Eye movements.  Heart rate and rhythm.  Breathing rate and rhythm.  Blood-oxygen level.  Blood pressure.  Chest and belly movement as you breathe.  Arm and leg movements.  Snoring  or other noises.  Body position. Where are sleep studies done? Sleep studies are done at sleep centers. A sleep center may be inside a hospital, office, or clinic. The room where you have the study may look like a hospital room or a hotel room. The health care providers doing the study may come in and out of the room during the study. Most of the time, they will be in another room monitoring  your test as you sleep. How are sleep studies done? Most sleep studies are done during a normal period of time for a full night of sleep. You will arrive at the study center in the evening and go home in the morning. Before the test  Bring your pajamas and toothbrush with you to the sleep study.  Do not have caffeine on the day of your sleep study.  Do not drink alcohol on the day of your sleep study.  Your health care provider will let you know if you should stop taking any of your regular medicines before the test. During the test      Round, sticky patches with sensors attached to recording wires (electrodes) are placed on your scalp, face, chest, and limbs.  Wires from all the electrodes and sensors run from your bed to a computer. The wires can be taken off and put back on if you need to get out of bed to go to the bathroom.  A sensor is placed over your nose to measure airflow.  A finger clip is put on your finger or ear to measure your blood oxygen level (pulse oximetry).  A belt is placed around your belly and a belt is placed around your chest to measure breathing movements.  If you have signs of the sleep disorder called sleep apnea during your test, you may get a treatment mask to wear for the second half of the night. ? The mask provides positive airway pressure (PAP) to help you breathe better during sleep. This may greatly improve your sleep apnea. ? You will then have all tests done again with the mask in place to see if your measurements and recordings change. After the test  A medical doctor who specializes in sleep will evaluate the results of your sleep study and share them with you and your primary health care provider.  Based on your results, your medical history, and a physical exam, you may be diagnosed with a sleep disorder, such as: ? Sleep apnea. ? Restless legs syndrome. ? Sleep-related behavior disorder. ? Sleep-related movement  disorders. ? Sleep-related seizure disorders.  Your health care team will help determine your treatment options based on your diagnosis. This may include: ? Improving your sleep habits (sleep hygiene). ? Wearing a continuous positive airway pressure (CPAP) or bi-level positive airway pressure (BPAP) mask. ? Wearing an oral device at night to improve breathing and reduce snoring. ? Taking medicines. Follow these instructions at home:  Take over-the-counter and prescription medicines only as told by your health care provider.  If you are instructed to use a CPAP or BPAP mask, make sure you use it nightly as directed.  Make any lifestyle changes that your health care provider recommends.  If you were given a device to open your airway while you sleep, use it only as told by your health care provider.  Do not use any tobacco products, such as cigarettes, chewing tobacco, and e-cigarettes. If you need help quitting, ask your health care provider.  Keep all follow-up visits  as told by your health care provider. This is important. Summary  A sleep study (polysomnogram) is a series of tests done while you are sleeping. It shows how well you sleep.  Most sleep studies are done over one full night of sleep. You will arrive at the study center in the evening and go home in the morning.  If you have signs of the sleep disorder called sleep apnea during your test, you may get a treatment mask to wear for the second half of the night.  A medical doctor who specializes in sleep will evaluate the results of your sleep study and share them with your primary health care provider. This information is not intended to replace advice given to you by your health care provider. Make sure you discuss any questions you have with your health care provider. Document Revised: 12/09/2018 Document Reviewed: 07/22/2017 Elsevier Patient Education  [place].

## 2020-05-22 ENCOUNTER — Telehealth: Payer: Self-pay | Admitting: Cardiology

## 2020-05-22 DIAGNOSIS — R400 Somnolence: Secondary | ICD-10-CM

## 2020-05-22 NOTE — Telephone Encounter (Signed)
Patient states that Dr. Harriet Masson and her had discussed the possibility of doing a sleep study at home. Patient is requesting more information about this - she would like to go this route due to family issues needing her at home. Please call/advise.   Thank you!

## 2020-05-23 NOTE — Telephone Encounter (Signed)
Will send to sleep pool to see if they can answer patient's questions.

## 2020-05-26 ENCOUNTER — Encounter (HOSPITAL_BASED_OUTPATIENT_CLINIC_OR_DEPARTMENT_OTHER): Payer: PRIVATE HEALTH INSURANCE | Admitting: Cardiovascular Disease

## 2020-06-06 NOTE — Telephone Encounter (Signed)
Patient is request HST, can Dr. Harriet Masson put this order in?

## 2020-06-06 NOTE — Telephone Encounter (Signed)
Order has been placed at this time.

## 2020-06-09 ENCOUNTER — Ambulatory Visit: Payer: PRIVATE HEALTH INSURANCE | Admitting: Cardiology

## 2020-06-19 ENCOUNTER — Encounter: Payer: Self-pay | Admitting: Family Medicine

## 2020-06-19 ENCOUNTER — Other Ambulatory Visit: Payer: Self-pay

## 2020-06-19 ENCOUNTER — Ambulatory Visit (INDEPENDENT_AMBULATORY_CARE_PROVIDER_SITE_OTHER): Payer: PRIVATE HEALTH INSURANCE | Admitting: Family Medicine

## 2020-06-19 VITALS — BP 138/90 | HR 84 | Temp 99.5°F | Resp 19 | Ht 62.0 in | Wt 153.2 lb

## 2020-06-19 DIAGNOSIS — Z0000 Encounter for general adult medical examination without abnormal findings: Secondary | ICD-10-CM

## 2020-06-19 DIAGNOSIS — E559 Vitamin D deficiency, unspecified: Secondary | ICD-10-CM | POA: Diagnosis not present

## 2020-06-19 DIAGNOSIS — I10 Essential (primary) hypertension: Secondary | ICD-10-CM

## 2020-06-19 LAB — BASIC METABOLIC PANEL
BUN: 9 mg/dL (ref 6–23)
CO2: 29 mEq/L (ref 19–32)
Calcium: 9.1 mg/dL (ref 8.4–10.5)
Chloride: 102 mEq/L (ref 96–112)
Creatinine, Ser: 0.69 mg/dL (ref 0.40–1.20)
GFR: 106.45 mL/min (ref >60.00)
Glucose, Bld: 80 mg/dL (ref 70–99)
Potassium: 3.8 mEq/L (ref 3.5–5.1)
Sodium: 137 mEq/L (ref 135–145)

## 2020-06-19 LAB — CBC WITH DIFFERENTIAL/PLATELET
Basophils Absolute: 0 10*3/uL (ref 0.0–0.1)
Basophils Relative: 0.8 % (ref 0.0–3.0)
Eosinophils Absolute: 0.1 10*3/uL (ref 0.0–0.7)
Eosinophils Relative: 1.4 % (ref 0.0–5.0)
HCT: 40.4 % (ref 36.0–46.0)
Hemoglobin: 13.6 g/dL (ref 12.0–15.0)
Lymphocytes Relative: 23.9 % (ref 12.0–46.0)
Lymphs Abs: 1.2 10*3/uL (ref 0.7–4.0)
MCHC: 33.6 g/dL (ref 30.0–36.0)
MCV: 90.6 fl (ref 78.0–100.0)
Monocytes Absolute: 0.5 10*3/uL (ref 0.1–1.0)
Monocytes Relative: 9.4 % (ref 3.0–12.0)
Neutro Abs: 3.3 10*3/uL (ref 1.4–7.7)
Neutrophils Relative %: 64.5 % (ref 43.0–77.0)
Platelets: 237 10*3/uL (ref 150.0–400.0)
RBC: 4.46 Mil/uL (ref 3.87–5.11)
RDW: 13.2 % (ref 11.5–15.5)
WBC: 5.1 10*3/uL (ref 4.0–10.5)

## 2020-06-19 LAB — HEPATIC FUNCTION PANEL
ALT: 18 U/L (ref 0–35)
AST: 15 U/L (ref 0–37)
Albumin: 4.4 g/dL (ref 3.5–5.2)
Alkaline Phosphatase: 53 U/L (ref 39–117)
Bilirubin, Direct: 0.1 mg/dL (ref 0.0–0.3)
Total Bilirubin: 0.5 mg/dL (ref 0.2–1.2)
Total Protein: 7 g/dL (ref 6.0–8.3)

## 2020-06-19 LAB — LIPID PANEL
Cholesterol: 184 mg/dL (ref 0–200)
HDL: 67.1 mg/dL (ref >39.00)
LDL Cholesterol: 107 mg/dL — ABNORMAL HIGH (ref 0–99)
NonHDL: 116.42
Total CHOL/HDL Ratio: 3
Triglycerides: 45 mg/dL (ref 0.0–149.0)
VLDL: 9 mg/dL (ref 0.0–40.0)

## 2020-06-19 LAB — VITAMIN D 25 HYDROXY (VIT D DEFICIENCY, FRACTURES): VITD: 26.24 ng/mL — ABNORMAL LOW (ref 30.00–100.00)

## 2020-06-19 LAB — TSH: TSH: 2.18 u[IU]/mL (ref 0.35–4.50)

## 2020-06-19 MED ORDER — BETAMETHASONE VALERATE 0.12 % EX FOAM: CUTANEOUS | 3 refills | Status: DC

## 2020-06-19 NOTE — Assessment & Plan Note (Signed)
Pt has hx of similar.  Check labs.  Replete prn. 

## 2020-06-19 NOTE — Assessment & Plan Note (Signed)
Chronic problem.  Mildly elevated BP.  Will continue to follow.

## 2020-06-19 NOTE — Patient Instructions (Signed)
Follow up in 6 months to recheck BP We'll notify you of your lab results and make any changes if needed Continue to work on healthy diet and regular exercise- you can do it! Have them send me a copy of your mammogram Call with any questions or concerns Stay Safe!  Stay Healthy! Happy Holidays!!

## 2020-06-19 NOTE — Progress Notes (Signed)
   Subjective:    Patient ID: Kelsey [name], female    DOB: 08-Oct-1975, 44 y.o.   MRN: [id number]  HPI CPE- UTD on pap, has mammo scheduled.  Too young for colonoscopy.  UTD on flu shot  Reviewed past medical, surgical, family and social histories.   Patient Care Team    Relationship Specialty Notifications Start End  Midge Minium, MD PCP - General Family Medicine  02/11/18    Comment: Pt states she never changed pcp Dr. Birdie Riddle is her pcp she tried to switch to a pcp at [facility] because it is closer to home but  this didn't work out  Berneice Heinrich, MD Consulting Physician Pediatrics  11/06/10   Aloha Gell, MD Consulting Physician Obstetrics and Gynecology  02/18/19     Health Maintenance  Topic Date Due  . MAMMOGRAM  06/21/2020 (Originally 06/01/2020)  . INFLUENZA VACCINE  10/05/2020 (Originally 02/06/2020)  . Hepatitis C Screening  03/16/2021 (Originally May 11, 1976)  . PAP SMEAR-Modifier  01/12/2021  . TETANUS/TDAP  08/31/2021  . HIV Screening  Completed      Review of Systems Patient reports no vision/ hearing changes, adenopathy,fever, weight change,  persistant/recurrent hoarseness , swallowing issues, chest pain, palpitations, edema, persistant/recurrent cough, hemoptysis, dyspnea (rest/exertional/paroxysmal nocturnal), gastrointestinal bleeding (melena, rectal bleeding), abdominal pain, significant heartburn, bowel changes, GU symptoms (dysuria, hematuria, incontinence), Gyn symptoms (abnormal  bleeding, pain),  syncope, focal weakness, memory loss, numbness & tingling, skin/hair/nail changes, abnormal bruising or bleeding, anxiety, or depression.   This visit occurred during the SARS-CoV-2 public health emergency.  Safety protocols were in place, including screening questions prior to the visit, additional usage of staff PPE, and extensive cleaning of exam room while observing appropriate contact time as indicated for disinfecting solutions.       Objective:    Physical Exam General Appearance:    Alert, cooperative, no distress, appears stated age  Head:    Normocephalic, without obvious abnormality, atraumatic  Eyes:    PERRL, conjunctiva/corneas clear, EOM's intact, fundi    benign, both eyes  Ears:    Normal TM's and external ear canals, both ears  Nose:   Deferred due to COVID  Throat:   Neck:   Supple, symmetrical, trachea midline, no adenopathy;    Thyroid: no enlargement/tenderness/nodules  Back:     Symmetric, no curvature, ROM normal, no CVA tenderness  Lungs:     Clear to auscultation bilaterally, respirations unlabored  Chest Wall:    No tenderness or deformity   Heart:    Regular rate and rhythm, S1 and S2 normal, no murmur, rub   or gallop  Breast Exam:    Deferred to GYN  Abdomen:     Soft, non-tender, bowel sounds active all four quadrants,    no masses, no organomegaly  Genitalia:    Deferred to GYN  Rectal:    Extremities:   Extremities normal, atraumatic, no cyanosis or edema  Pulses:   2+ and symmetric all extremities  Skin:   Skin color, texture, turgor normal, no rashes or lesions  Lymph nodes:   Cervical, supraclavicular, and axillary nodes normal  Neurologic:   CNII-XII intact, normal strength, sensation and reflexes    throughout          Assessment & Plan:

## 2020-06-19 NOTE — Assessment & Plan Note (Signed)
Pt's PE WNL.  UTD on pap.  Has mammo scheduled.  UTD on flu, Tdap.  Refusing COVID.  Check labs.  Anticipatory guidance provided.

## 2020-06-20 ENCOUNTER — Other Ambulatory Visit: Payer: Self-pay

## 2020-06-20 DIAGNOSIS — E559 Vitamin D deficiency, unspecified: Secondary | ICD-10-CM

## 2020-06-20 MED ORDER — VITAMIN D (ERGOCALCIFEROL) 1.25 MG (50000 UNIT) PO CAPS: 50000.0000 [IU] | ORAL_CAPSULE | ORAL | 0 refills | Status: DC

## 2020-06-21 LAB — HM MAMMOGRAPHY

## 2020-07-04 ENCOUNTER — Ambulatory Visit (HOSPITAL_BASED_OUTPATIENT_CLINIC_OR_DEPARTMENT_OTHER): Payer: PRIVATE HEALTH INSURANCE | Attending: Cardiology | Admitting: Cardiology

## 2020-07-04 ENCOUNTER — Other Ambulatory Visit: Payer: Self-pay

## 2020-07-04 DIAGNOSIS — G4733 Obstructive sleep apnea (adult) (pediatric): Secondary | ICD-10-CM | POA: Insufficient documentation

## 2020-07-04 DIAGNOSIS — R400 Somnolence: Secondary | ICD-10-CM | POA: Diagnosis present

## 2020-07-11 NOTE — Procedures (Signed)
   Patient Name: Francisco, Eyerly Date: 07/04/2020 Gender: Female D.O.B: Jun 25, 1976 Age (years): 63 Referring Provider: Lavona Mound Tobb DO Height (inches): 63 Interpreting Physician: [name] MD, ABSM Weight (lbs): 145 RPSGT: [place] BMI: 26 MRN: [id number]  CLINICAL INFORMATION Sleep Study Type: HST  Indication for sleep study: [name]  Epworth Sleepiness Score: 12  SLEEP STUDY TECHNIQUE A multi-channel overnight portable sleep study was performed. The channels recorded were: nasal airflow, thoracic respiratory movement, and oxygen saturation with a pulse oximetry. Snoring was also monitored.  MEDICATIONS Patient self administered medications include: [name].  SLEEP ARCHITECTURE Patient was studied for 505.1 minutes. The sleep efficiency was 100.0 % and the patient was supine for 74.5%. The arousal index was 0.0 per hour.  RESPIRATORY PARAMETERS The overall AHI was 5.7 per hour, with a central apnea index of 0.0 per hour.  The oxygen nadir was 84% during sleep.  CARDIAC DATA Mean heart rate during sleep was 80.5 bpm.  IMPRESSIONS - Mild obstructive sleep apnea occurred during this study (AHI = 5.7/h). - No significant central sleep apnea occurred during this study (CAI = 0.0/h). - Moderate oxygen desaturation was noted during this study (Min O2 = 84%). - Patient snored 1.0% during the sleep . DIAGNOSIS - Obstructive Sleep Apnea (G47.33)  RECOMMENDATIONS - Recommend in lab PSG to better define if patient has only mild OSA or more significant OSA during REM sleep resulting in fragmented sleep that would benefit from CPAP therapy.  - Positional therapy avoiding supine position during sleep. - Avoid alcohol, sedatives and other CNS depressants that may worsen sleep apnea and disrupt normal sleep architecture. - Sleep hygiene should be reviewed to assess factors that may improve sleep quality. - Weight management and regular exercise should be initiated or  continued.  [Electronically signed] 07/11/2020 04:39 PM  [name] MD, ABSM Diplomate, American Board of Sleep Medicine

## 2020-07-17 ENCOUNTER — Encounter: Payer: Self-pay | Admitting: Family Medicine

## 2020-07-17 NOTE — Telephone Encounter (Signed)
Reviewing in PCP absence. Do not see medication on her list. Would need video visit to evaluate and prescribe.

## 2020-07-19 ENCOUNTER — Telehealth: Payer: Self-pay | Admitting: *Deleted

## 2020-07-19 DIAGNOSIS — R400 Somnolence: Secondary | ICD-10-CM

## 2020-07-19 NOTE — Telephone Encounter (Signed)
-----   Message from Kelsey Margarita, MD sent at 07/11/2020  4:41 PM EST ----- Patient has very mild OSA overall but cannot tell how much in REM sleep that would determine mode of treatment.  Please order an in lab PSG

## 2020-07-19 NOTE — Telephone Encounter (Signed)
Informed patient of sleep study results and patient understanding was verbalized. Patient understands her sleep study showed has very mild OSA overall but cannot tell how much in REM sleep that would determine mode of treatment. Please order an in lab PSG  PER DPR Left detailed message on voicemail and informed patient to call back with questions

## 2020-07-24 ENCOUNTER — Ambulatory Visit: Payer: PRIVATE HEALTH INSURANCE | Admitting: Cardiology

## 2020-07-28 ENCOUNTER — Telehealth: Payer: Self-pay | Admitting: *Deleted

## 2020-07-28 NOTE — Telephone Encounter (Signed)
Staff message sent to Gae Bon ok to schedule sleep study. Per patient's insurance company no PA is required. Call reference # [id number].

## 2020-08-04 NOTE — Telephone Encounter (Signed)
RE: Eual Fines Received: 1 week ago Lauralee Evener, CMA  Freada Bergeron, CMA Ok to schedule NSPG. Per patient's insurance no PA is required. Call reference # [id number].

## 2020-08-04 NOTE — Telephone Encounter (Signed)
Patient is scheduled for lab study on 09/15/20. Patient understands her sleep study will be done at [facility] sleep lab. Patient understands she will receive a sleep packet in a week or so. Patient understands to call if she does not receive the sleep packet in a timely manner. Patient agrees with treatment and thanked me for call.  PER DPR Left detailed message on voicemail with date and time of titration and informed patient to call back to confirm or reschedule.

## 2020-08-18 DIAGNOSIS — J349 Unspecified disorder of nose and nasal sinuses: Secondary | ICD-10-CM | POA: Insufficient documentation

## 2020-08-18 DIAGNOSIS — F319 Bipolar disorder, unspecified: Secondary | ICD-10-CM | POA: Insufficient documentation

## 2020-08-18 DIAGNOSIS — D649 Anemia, unspecified: Secondary | ICD-10-CM | POA: Insufficient documentation

## 2020-08-21 ENCOUNTER — Ambulatory Visit (INDEPENDENT_AMBULATORY_CARE_PROVIDER_SITE_OTHER): Payer: PRIVATE HEALTH INSURANCE | Admitting: Cardiology

## 2020-08-21 ENCOUNTER — Other Ambulatory Visit: Payer: Self-pay

## 2020-08-21 ENCOUNTER — Encounter: Payer: Self-pay | Admitting: Cardiology

## 2020-08-21 VITALS — BP 118/82 | HR 80 | Ht 62.0 in | Wt 152.0 lb

## 2020-08-21 DIAGNOSIS — I493 Ventricular premature depolarization: Secondary | ICD-10-CM | POA: Diagnosis not present

## 2020-08-21 DIAGNOSIS — R5383 Other fatigue: Secondary | ICD-10-CM | POA: Diagnosis not present

## 2020-08-21 DIAGNOSIS — G4733 Obstructive sleep apnea (adult) (pediatric): Secondary | ICD-10-CM | POA: Diagnosis not present

## 2020-08-21 DIAGNOSIS — I10 Essential (primary) hypertension: Secondary | ICD-10-CM

## 2020-08-21 NOTE — Progress Notes (Signed)
Cardiology Office Note:    Date:  08/21/2020   ID:  Kelsey [name], DOB 06/02/1976, MRN [id number]  PCP:  Kelsey Minium, MD  Cardiologist:  Kelsey Salines, DO  Electrophysiologist:  None   Referring MD: Kelsey Minium, MD   I feel much better  History of Present Illness:    Kelsey [name] is a 45 y.o. female with a hx of with a hx of family history of long QT syndrome with a brother who died acutely after his alarm clock sounded, this was how the family got diagnosed. The patient tells me she followed with the [facility] for many years and I was able to review this record in 2011she was discharged from the [facility] long QT clinic with a diagnosis of sinus arrhythmia. At that time her QTC was 383. She was taken off beta-blockersas well.  Since her discharge from [facility] in 2011 she has not seen a cardiologist.  She has been following her PCP.  I did see the patient initially on February 25, 2020 at that time she was reporting palpitations as well as Hennis of dizziness.  Today today visit I placed a monitor and the patient and got an echocardiogram to reassess her LV function.  Due to being hypertensive I started the patient on atenolol 25 mg daily.  Blood work was done due to her fatigue for vitamin B12 as well as vitamin D levels.  I saw the patient April 19, 2020 at that time she was experiencing symptomatic PVCs and she was hypertensive I increase her atenolol to 50 mg daily.  Hoping will help with her elevated blood pressure.  I also started patient on vitamin D giving that her vitamin D levels were low normal.  I recommended she undergo a sleep study and also suspected clinical depression playing a role for this.  In the interim she was able to get a home sleep study which revealed diagnosed mild sleep apnea.  And she has been seeing her therapist but has not been taking any Celexa.  She still has some fatigue peripheral no other complaints  at this time.  Past Medical History:  Diagnosis Date  . Abnormal Pap smear   . Anemia   . Bipolar disorder ([place])   . Chronic sinusitis   . Headache(784.0)   . Pyelonephritis 2008   Pylo and stones in the past  . Sinus problem    sinusitis    Past Surgical History:  Procedure Laterality Date  . CESAREAN SECTION    . CESAREAN SECTION  08/31/2011   Procedure: CESAREAN SECTION;  Surgeon: Kelsey Billings A. Pamala Hurry, MD;  Location: [place] [facility];  Service: Gynecology;  Laterality: [name];  Repeat cesarean section with delivery of baby   . COLPOSCOPY    . KNEE ARTHROSCOPY     right knee  . WISDOM TOOTH EXTRACTION      Current Medications: Current Meds  Medication Sig  . atenolol (TENORMIN) 50 MG tablet Take 1 tablet (50 mg total) by mouth daily.  . Betamethasone Valerate 0.12 % foam Apply topically BID  . celecoxib (CELEBREX) 200 MG capsule Take 200 mg by mouth 2 (two) times daily.  . citalopram (CELEXA) 10 MG tablet Take 5-10 mg by mouth daily.  [name] (MIRENA) 20 MCG/24HR IUD 1 each by Intrauterine route once.  . Vitamin D, Ergocalciferol, (DRISDOL) 1.25 MG (50000 UNIT) CAPS capsule Take 1 capsule (50,000 Units total) by mouth every 7 (seven) days. For 8 weeks.  Marland Kitchen  Vitamin D, Ergocalciferol, (DRISDOL) 1.25 MG (50000 UNIT) CAPS capsule Take 1 capsule (50,000 Units total) by mouth every 7 (seven) days.     Allergies:   Morphine and related, Sulfa antibiotics, and Oseltamivir   Social History   Socioeconomic History  . Marital status: Married    Spouse name: Not on file  . Number of children: 2  . Years of education: Not on file  . Highest education level: Not on file  Occupational History  . 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  Tobacco Use  . Smoking status: Never Smoker  . Smokeless tobacco: Never Used  Substance and Sexual Activity  . Alcohol use: No  . Drug use: No  . Sexual activity: Yes  Other Topics Concern  . Not on file  Social History  Narrative   1 daughter--Kelsey [name] born 12/09, 1 step son   Social Determinants of Health   Financial Resource Strain: Not on file  Food Insecurity: Not on file  Transportation Needs: Not on file  Physical Activity: Not on file  Stress: Not on file  Social Connections: Not on file     Family History: The patient's family history includes Asthma in her brother; Coronary artery disease in her maternal grandfather; Diabetes in her maternal grandfather; Heart disease in her brother and mother; Heart disease (age of onset: 87) in her maternal grandfather; Kidney disease in her maternal grandfather. There is no history of Cancer, Stroke, or Hypertension.  ROS:   Review of Systems  Constitution: Negative for decreased appetite, fever and weight gain.  HENT: Negative for congestion, ear discharge, hoarse voice and sore throat.   Eyes: Negative for discharge, redness, vision loss in right eye and visual halos.  Cardiovascular: Negative for chest pain, dyspnea on exertion, leg swelling, orthopnea and palpitations.  Respiratory: Negative for cough, hemoptysis, shortness of breath and snoring.   Endocrine: Negative for heat intolerance and polyphagia.  Hematologic/Lymphatic: Negative for bleeding problem. Does not bruise/bleed easily.  Skin: Negative for flushing, nail changes, rash and suspicious lesions.  Musculoskeletal: Negative for arthritis, joint pain, muscle cramps, myalgias, neck pain and stiffness.  Gastrointestinal: Negative for abdominal pain, bowel incontinence, diarrhea and excessive appetite.  Genitourinary: Negative for decreased libido, genital sores and incomplete emptying.  Neurological: Negative for brief paralysis, focal weakness, headaches and loss of balance.  Psychiatric/Behavioral: Negative for altered mental status, depression and suicidal ideas.  Allergic/Immunologic: Negative for HIV exposure and persistent infections.    EKGs/Labs/Other Studies Reviewed:    The  following studies were reviewed today:   EKG: None today  2D echo IMPRESSIONS  1. Left ventricular ejection fraction, by estimation, is 55 to 60%. The left ventricle has normal function. The left ventricle has no regional wall motion abnormalities. Left ventricular diastolic parameters were normal. The average left ventricular global longitudinal strain is -16.7 %.  2. Right ventricular systolic function is normal. The right ventricular size is normal. There is normal pulmonary artery systolic pressure.  3. The mitral valve is normal in structure. Trivial mitral valve regurgitation. No evidence of mitral stenosis.  4. The aortic valve is normal in structure. Aortic valve regurgitation is not visualized.   FINDINGS  Left Ventricle: Left ventricular ejection fraction, by estimation, is 55  to 60%. The left ventricle has normal function. The left ventricle has no  regional wall motion abnormalities. The average left ventricular global  longitudinal strain is -16.7 %.  The left ventricular internal cavity size was normal in size.  There is no  left ventricular hypertrophy. Left ventricular diastolic parameters were  normal.   Right Ventricle: The right ventricular size is normal. No increase in  right ventricular wall thickness. Right ventricular systolic function is  normal. There is normal pulmonary artery systolic pressure. The tricuspid  regurgitant velocity is 2.07 m/s, and  with an assumed right atrial pressure of 3 mmHg, the estimated right  ventricular systolic pressure is 48.5 mmHg.   Left Atrium: Left atrial size was normal in size.   Right Atrium: Right atrial size was normal in size.   Pericardium: There is no evidence of pericardial effusion.   Mitral Valve: The mitral valve is normal in structure. Trivial mitral  valve regurgitation. No evidence of mitral valve stenosis.   Tricuspid Valve: The tricuspid valve is normal in structure. Tricuspid  valve regurgitation  is trivial.   Aortic Valve: The aortic valve is normal in structure. Aortic valve  regurgitation is not visualized.   Pulmonic Valve: The pulmonic valve was grossly normal. Pulmonic valve  regurgitation is not visualized. No evidence of pulmonic stenosis.   Aorta: The aortic root and ascending aorta are structurally normal, with  no evidence of dilitation.   IAS/Shunts: The atrial septum is grossly normal.   ZIO monitor The patient wore the monitor for 13 days 23 hours starting February 25, 2020.. Indication: Palpitations  The minimum heart rate was 55 bpm, maximum heart rate was 161 bpm, and average heart rate was 94 bpm. Predominant underlying rhythm was Sinus Rhythm.   Premature atrial complexes were rare less than 1%. Premature Ventricular complexes rare less than 1%.  No triggered tachycardia, no pauses, No AV block, no supraventricular and no atrial fibrillation present.  100 patient triggered events and 50 diary events most of which were associated with premature ventricular complex.  Conclusion: This study is remarkable for symptomatic rare premature ventricular complex.  Recent Labs: 06/19/2020: ALT 18; BUN 9; Creatinine, Ser 0.69; Hemoglobin 13.6; Platelets 237.0; Potassium 3.8; Sodium 137; TSH 2.18  Recent Lipid Panel    Component Value Date/Time   CHOL 184 06/19/2020 1303   TRIG 45.0 06/19/2020 1303   HDL 67.10 06/19/2020 1303   CHOLHDL 3 06/19/2020 1303   VLDL 9.0 06/19/2020 1303   LDLCALC 107 (H) 06/19/2020 1303    Physical Exam:    VS:  BP 118/82   Pulse 80   Ht 5\' 2"  (1.575 m)   Wt 152 lb (68.9 kg)   SpO2 97%   BMI 27.80 kg/m     Wt Readings from Last 3 Encounters:  08/21/20 152 lb (68.9 kg)  07/04/20 145 lb (65.8 kg)  06/19/20 153 lb 3.2 oz (69.5 kg)     GEN: Well nourished, well developed in no acute distress HEENT: Normal NECK: No JVD; No carotid bruits LYMPHATICS: No lymphadenopathy CARDIAC: S1S2 noted,RRR, no murmurs, rubs,  gallops RESPIRATORY:  Clear to auscultation without rales, wheezing or rhonchi  ABDOMEN: Soft, non-tender, non-distended, +bowel sounds, no guarding. EXTREMITIES: No edema, No cyanosis, no clubbing MUSCULOSKELETAL:  No deformity  SKIN: Warm and dry NEUROLOGIC:  Alert and oriented x 3, non-focal PSYCHIATRIC:  Normal affect, good insight  ASSESSMENT:    1. OSA (obstructive sleep apnea)   2. Fatigue, unspecified type   3. Hypertension, unspecified type   4. Symptomatic PVCs   5. Essential hypertension    PLAN:     1.  Her blood pressure has improved significantly we will keep the patient on atenolol.  She still  has some fatigue but for now she is here is not as bad.  She is taking her vitamin D.  She was diagnosed with mild sleep apnea from a sleep study and is pending another sleep study in-house.  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  We discussed hopefully the patient will be able to schedule this sooner rather than later due to the assessment of need for CPAP.  We talked about how treating sleep apnea will help the patient in the long run she expresses understanding.  Her blood pressure is acceptable in the office no changes will be made to her antihypertensive regimen.  I am happy for the patient she is undergoing therapy and she is doing well.  The patient is in agreement with the above plan. The patient left the office in stable condition.  The patient will follow up in   Medication Adjustments/Labs and Tests Ordered: Current medicines are reviewed at length with the patient today.  Concerns regarding medicines are outlined above.  No orders of the defined types were placed in this encounter.  No orders of the defined types were placed in this encounter.   Patient Instructions  Medication Instructions:  Your physician recommends that you continue on your current medications as directed. Please refer to the Current Medication list given to you  today.  *If you need a refill on your cardiac medications before your next appointment, please call your pharmacy*   Lab Work: None If you have labs (blood work) drawn today and your tests are completely normal, you will receive your results only by: Marland Kitchen MyChart Message (if you have MyChart) OR . A paper copy in the mail If you have any lab test that is abnormal or we need to change your treatment, we will call you to review the results.   Testing/Procedures: None   Follow-Up: At [facility], you and your health needs are our priority.  As part of our continuing mission to provide you with exceptional heart care, we have created designated Provider Care Teams.  These Care Teams include your primary Cardiologist (physician) and Advanced Practice Providers (APPs -  Physician Assistants and Nurse Practitioners) who all work together to provide you with the care you need, when you need it.  We recommend signing up for the patient portal called "MyChart".  Sign up information is provided on this After Visit Summary.  MyChart is used to connect with patients for Virtual Visits (Telemedicine).  Patients are able to view lab/test results, encounter notes, upcoming appointments, etc.  Non-urgent messages can be sent to your provider as well.   To learn more about what you can do with MyChart, go to [email or web address].    Your next appointment:   1 year(s)  The format for your next appointment:   In Person  Provider:   Berniece Salines, DO   Other Instructions      Adopting a Healthy Lifestyle.  Know what a healthy weight is for you (roughly BMI <25) and aim to maintain this   Aim for 7+ servings of fruits and vegetables daily   65-80+ fluid ounces of water or unsweet tea for healthy kidneys   Limit to max 1 drink of alcohol per day; avoid smoking/tobacco   Limit animal fats in diet for cholesterol and heart health - choose grass fed whenever available   Avoid highly  processed foods, and foods high in saturated/trans fats   Aim for low stress - take  time to unwind and care for your mental health   Aim for 150 min of moderate intensity exercise weekly for heart health, and weights twice weekly for bone health   Aim for 7-9 hours of sleep daily   When it comes to diets, agreement about the perfect plan isnt easy to find, even among the experts. Experts at the [employer] developed an idea known as the Healthy Eating Plate. Just imagine a plate divided into logical, healthy portions.   The emphasis is on diet quality:   Load up on vegetables and fruits - one-half of your plate: Aim for color and variety, and remember that potatoes dont count.   Go for whole grains - one-quarter of your plate: Whole wheat, barley, wheat berries, quinoa, oats, brown rice, and foods made with them. If you want pasta, go with whole wheat pasta.   Protein power - one-quarter of your plate: Fish, chicken, beans, and nuts are all healthy, versatile protein sources. Limit red meat.   The diet, however, does go beyond the plate, offering a few other suggestions.   Use healthy plant oils, such as olive, canola, soy, corn, sunflower and peanut. Check the labels, and avoid partially hydrogenated oil, which have unhealthy trans fats.   If youre thirsty, drink water. Coffee and tea are good in moderation, but skip sugary drinks and limit milk and dairy products to one or two daily servings.   The type of carbohydrate in the diet is more important than the amount. Some sources of carbohydrates, such as vegetables, fruits, whole grains, and beans-are healthier than others.   Finally, stay active  Signed, Kelsey Salines, DO  08/21/2020 8:35 AM    [facility]

## 2020-08-21 NOTE — Patient Instructions (Signed)

## 2020-09-15 ENCOUNTER — Encounter (HOSPITAL_BASED_OUTPATIENT_CLINIC_OR_DEPARTMENT_OTHER): Payer: PRIVATE HEALTH INSURANCE | Admitting: Cardiology

## 2020-10-19 ENCOUNTER — Encounter: Payer: Self-pay | Admitting: Family Medicine

## 2020-10-19 ENCOUNTER — Other Ambulatory Visit: Payer: Self-pay

## 2020-10-19 ENCOUNTER — Ambulatory Visit (INDEPENDENT_AMBULATORY_CARE_PROVIDER_SITE_OTHER): Payer: PRIVATE HEALTH INSURANCE | Admitting: Family Medicine

## 2020-10-19 VITALS — BP 122/84 | HR 87 | Temp 99.0°F | Resp 20 | Ht 62.0 in | Wt 150.0 lb

## 2020-10-19 DIAGNOSIS — K219 Gastro-esophageal reflux disease without esophagitis: Secondary | ICD-10-CM | POA: Insufficient documentation

## 2020-10-19 DIAGNOSIS — R5383 Other fatigue: Secondary | ICD-10-CM | POA: Diagnosis not present

## 2020-10-19 DIAGNOSIS — R519 Headache, unspecified: Secondary | ICD-10-CM

## 2020-10-19 DIAGNOSIS — F439 Reaction to severe stress, unspecified: Secondary | ICD-10-CM

## 2020-10-19 DIAGNOSIS — D509 Iron deficiency anemia, unspecified: Secondary | ICD-10-CM | POA: Insufficient documentation

## 2020-10-19 DIAGNOSIS — E559 Vitamin D deficiency, unspecified: Secondary | ICD-10-CM | POA: Diagnosis not present

## 2020-10-19 DIAGNOSIS — R110 Nausea: Secondary | ICD-10-CM | POA: Diagnosis not present

## 2020-10-19 DIAGNOSIS — R194 Change in bowel habit: Secondary | ICD-10-CM | POA: Insufficient documentation

## 2020-10-19 DIAGNOSIS — R1111 Vomiting without nausea: Secondary | ICD-10-CM | POA: Insufficient documentation

## 2020-10-19 LAB — BASIC METABOLIC PANEL
BUN: 9 mg/dL (ref 6–23)
CO2: 30 mEq/L (ref 19–32)
Calcium: 9.3 mg/dL (ref 8.4–10.5)
Chloride: 102 mEq/L (ref 96–112)
Creatinine, Ser: 0.74 mg/dL (ref 0.40–1.20)
GFR: 99.01 mL/min (ref >60.00)
Glucose, Bld: 75 mg/dL (ref 70–99)
Potassium: 4.2 mEq/L (ref 3.5–5.1)
Sodium: 138 mEq/L (ref 135–145)

## 2020-10-19 LAB — HEPATIC FUNCTION PANEL
ALT: 16 U/L (ref 0–35)
AST: 16 U/L (ref 0–37)
Albumin: 4.1 g/dL (ref 3.5–5.2)
Alkaline Phosphatase: 54 U/L (ref 39–117)
Bilirubin, Direct: 0.1 mg/dL (ref 0.0–0.3)
Total Bilirubin: 0.5 mg/dL (ref 0.2–1.2)
Total Protein: 7.2 g/dL (ref 6.0–8.3)

## 2020-10-19 LAB — CBC WITH DIFFERENTIAL/PLATELET
Basophils Absolute: 0.1 10*3/uL (ref 0.0–0.1)
Basophils Relative: 1 % (ref 0.0–3.0)
Eosinophils Absolute: 0.1 10*3/uL (ref 0.0–0.7)
Eosinophils Relative: 1.5 % (ref 0.0–5.0)
HCT: 42.4 % (ref 36.0–46.0)
Hemoglobin: 14.3 g/dL (ref 12.0–15.0)
Lymphocytes Relative: 18.6 % (ref 12.0–46.0)
Lymphs Abs: 1 10*3/uL (ref 0.7–4.0)
MCHC: 33.8 g/dL (ref 30.0–36.0)
MCV: 90.8 fl (ref 78.0–100.0)
Monocytes Absolute: 0.4 10*3/uL (ref 0.1–1.0)
Monocytes Relative: 8.4 % (ref 3.0–12.0)
Neutro Abs: 3.7 10*3/uL (ref 1.4–7.7)
Neutrophils Relative %: 70.5 % (ref 43.0–77.0)
Platelets: 209 10*3/uL (ref 150.0–400.0)
RBC: 4.67 Mil/uL (ref 3.87–5.11)
RDW: 13.3 % (ref 11.5–15.5)
WBC: 5.2 10*3/uL (ref 4.0–10.5)

## 2020-10-19 LAB — B12 AND FOLATE PANEL
Folate: 12.5 ng/mL (ref >5.9)
Vitamin B-12: 199 pg/mL — ABNORMAL LOW (ref 211–911)

## 2020-10-19 LAB — VITAMIN D 25 HYDROXY (VIT D DEFICIENCY, FRACTURES): VITD: 30.89 ng/mL (ref 30.00–100.00)

## 2020-10-19 LAB — TSH: TSH: 1.35 u[IU]/mL (ref 0.35–4.50)

## 2020-10-19 MED ORDER — CLOTRIMAZOLE-BETAMETHASONE 1-0.05 % EX CREA: 1.0000 "application " | TOPICAL_CREAM | Freq: Every day | CUTANEOUS | 0 refills | Status: DC

## 2020-10-19 MED ORDER — CYCLOBENZAPRINE HCL 10 MG PO TABS
10.0000 mg | ORAL_TABLET | Freq: Every day | ORAL | 0 refills | Status: DC
Start: 2020-10-19 — End: 2020-11-27

## 2020-10-19 NOTE — Progress Notes (Signed)
   Subjective:    Patient ID: Kelsey [name], female    DOB: 1975/08/08, 45 y.o.   MRN: [id number]  HPI Headaches- posterior.  Can wake up with them.  Denies sensitivity to light/sound.  Has been taking Excedrin Migraine regularly.  Fatigue- pt reports in the last 1-2 months it has been excessive.  Feels the need to lie down in the afternoon.  'exhausted' by 7-8pm.  Also feels some muscle weakness.  Recently has had difficulty carrying her large work bag.  Pt isn't sure if this is COVID related.  Rash under arms- bilaterally.  Has tried cortisone, antifungal creams w/o relief.  Described as red, 'almost like a burn'.  Sometimes itchy.  When it does itch, 'it's really itchy'.  Night sweats- pt woke up 'drenched' last night  Increased stress- she is going through a long, drawn out custody battle.  Seeing psychiatrist and therapist.  Has increased Celexa recently.  + dull, aching of stomach.  + nausea, no vomiting.  Some relief w/ Protonix but she is not taking it regularly.   Review of Systems For ROS see HPI   This visit occurred during the SARS-CoV-2 public health emergency.  Safety protocols were in place, including screening questions prior to the visit, additional usage of staff PPE, and extensive cleaning of exam room while observing appropriate contact time as indicated for disinfecting solutions.       Objective:   Physical Exam Vitals reviewed.  Constitutional:      General: She is not in acute distress.    Appearance: She is well-developed. She is not ill-appearing.  HENT:     Head: Normocephalic and atraumatic.  Eyes:     Conjunctiva/sclera: Conjunctivae normal.     Pupils: Pupils are equal, round, and reactive to light.  Neck:     Thyroid: No thyromegaly.     Comments: Bilateral trap spasms Cardiovascular:     Rate and Rhythm: Normal rate and regular rhythm.     Heart sounds: Normal heart sounds. No murmur heard. Pulmonary:     Effort: Pulmonary effort is  normal. No respiratory distress.     Breath sounds: Normal breath sounds.  Abdominal:     General: There is no distension.     Palpations: Abdomen is soft.     Tenderness: There is no abdominal tenderness.  Musculoskeletal:     Cervical back: Normal range of motion and neck supple.  Lymphadenopathy:     Cervical: No cervical adenopathy.  Skin:    General: Skin is warm and dry.     Findings: Rash (faint erythematous rash under arms) present.  Neurological:     Mental Status: She is alert and oriented to person, place, and time.  Psychiatric:        Behavior: Behavior normal.          Assessment & Plan:  Posterior HA- suspect this is tension related as she has bilateral trap spasm.  Start Flexeril prn and work on stretching and stress management.  Pt expressed understanding and is in agreement w/ plan.   Nausea- suspect untreated/undertreated GERD.  Encouraged her to take the Protonix daily.  Stress at home- she is seeing both a psychiatrist and a therapist.  Celexa was recently increased.  Suspect this is the cause of her recent night sweats.

## 2020-10-19 NOTE — Patient Instructions (Addendum)
Follow up as needed or as scheduled We'll notify you of your lab results and make any changes if needed TAKE the Protonix daily to decrease stomach upset and nausea Apply the Lotrisone cream twice daily on the rash USE the Cyclobenzaprine (Flexeril) nightly to improve headache Drink LOTS of water Monitor the night sweats to see if they start happening regularly.  This could be hormonal or due to the Celexa Call with any questions or concerns Hang in there!

## 2020-10-20 LAB — IRON,TIBC AND FERRITIN PANEL
%SAT: 31 % (calc) (ref 16–45)
Ferritin: 37 ng/mL (ref 16–232)
Iron: 100 ug/dL (ref 40–190)
TIBC: 326 mcg/dL (calc) (ref 250–450)

## 2020-10-24 ENCOUNTER — Encounter: Payer: Self-pay | Admitting: Family Medicine

## 2020-10-25 ENCOUNTER — Telehealth: Payer: Self-pay

## 2020-10-25 NOTE — Telephone Encounter (Signed)
While waiting on the prior authorization for the B12 injxns, I recommend she start a daily OTC B12 supplement.  This will also help strengthen our case for the prior authorization

## 2020-10-25 NOTE — Telephone Encounter (Signed)
Called and notified patient to start taking otc mediction.

## 2020-10-25 NOTE — Telephone Encounter (Signed)
Patient called back for me to give her the lab results. Patient would like to get the b12 injections, and said that she would need a Prior Auth an will be sending the forms through [place].

## 2020-10-26 ENCOUNTER — Other Ambulatory Visit: Payer: Self-pay

## 2020-10-26 MED ORDER — CYANOCOBALAMIN 1000 MCG/ML IJ SOLN: 1000.0000 ug | INTRAMUSCULAR | 0 refills | Status: DC

## 2020-10-27 ENCOUNTER — Telehealth: Payer: Self-pay | Admitting: Family Medicine

## 2020-10-27 ENCOUNTER — Other Ambulatory Visit: Payer: Self-pay | Admitting: Family Medicine

## 2020-10-27 NOTE — Telephone Encounter (Signed)
Pt called in with questions on how to handle the B 12 injection. She also wanted to know if the PA was approved with insurance. If she was to give it to herself or was she to come here and have it done via a nurse visit.   Pt can be reached at the home #  Please advise

## 2020-10-27 NOTE — Progress Notes (Signed)
Prescription for B12 injxns already sent

## 2020-11-10 ENCOUNTER — Other Ambulatory Visit: Payer: Self-pay

## 2020-11-10 DIAGNOSIS — E538 Deficiency of other specified B group vitamins: Secondary | ICD-10-CM

## 2020-11-10 NOTE — Telephone Encounter (Signed)
Called patient back with pcp recommendations. Patient voiced understanding and will call [place] to get labs drawn. Orders placed.

## 2020-11-10 NOTE — Telephone Encounter (Signed)
I got with Maudie Mercury at [facility], and she is ok with scheduling patient for B12 injection on their nurse visit schedule; they typically don't do them on Mondays.  The CPT code for B12 injection administration is J3420.

## 2020-11-10 NOTE — Telephone Encounter (Signed)
Called patient in reference to B12. Patient states she has started administering the B12 herself on 10/30/20 and was wondering when she needs to be rechecked.

## 2020-11-10 NOTE — Telephone Encounter (Signed)
She needs a B12 level rechecked in 6 months

## 2020-11-27 ENCOUNTER — Other Ambulatory Visit: Payer: Self-pay | Admitting: Family Medicine

## 2020-11-28 MED ORDER — CYCLOBENZAPRINE HCL 10 MG PO TABS: 10.0000 mg | ORAL_TABLET | Freq: Every day | ORAL | 0 refills | Status: DC

## 2021-01-03 ENCOUNTER — Encounter: Payer: Self-pay | Admitting: *Deleted

## 2021-03-16 NOTE — Assessment & Plan Note (Signed)
Pt has hx of similar.  Check labs and treat prn as this may be contributing to her fatigue

## 2021-03-16 NOTE — Assessment & Plan Note (Signed)
Check labs and replete prn.  Possibly contributing to her fatigue

## 2021-03-21 ENCOUNTER — Other Ambulatory Visit: Payer: Self-pay | Admitting: Family Medicine

## 2021-03-22 NOTE — Telephone Encounter (Signed)
Patient was last seen 10/27/2020, per that office note it stated to follow up as needed or as scheduled. There is no follow up scheduled. Ok to fill rx?

## 2021-03-26 MED ORDER — CYCLOBENZAPRINE HCL 10 MG PO TABS: 10.0000 mg | ORAL_TABLET | Freq: Every day | ORAL | 0 refills | Status: DC

## 2021-09-14 ENCOUNTER — Telehealth: Payer: Self-pay | Admitting: Family Medicine

## 2021-09-14 ENCOUNTER — Encounter: Payer: Self-pay | Admitting: Family Medicine

## 2021-09-14 NOTE — Telephone Encounter (Signed)
Patient would like to transfer care from Dr. Birdie Riddle to [facility]. She stated [facility] is much more closer to her and convenient. Would this be ok?  ?

## 2021-09-14 NOTE — Telephone Encounter (Signed)
Ok to transfer.  I wish her the best 

## 2021-09-17 NOTE — Telephone Encounter (Signed)
LVM to call back and schedule np appointment.  ?

## 2021-09-19 NOTE — Telephone Encounter (Signed)
Please see note attached! ? ?Please advise  ?

## 2021-11-06 ENCOUNTER — Ambulatory Visit (INDEPENDENT_AMBULATORY_CARE_PROVIDER_SITE_OTHER): Payer: PRIVATE HEALTH INSURANCE | Admitting: Family

## 2021-11-06 VITALS — BP 144/80 | HR 103 | Temp 97.9°F | Resp 18 | Ht 62.0 in | Wt 155.0 lb

## 2021-11-06 DIAGNOSIS — R5383 Other fatigue: Secondary | ICD-10-CM

## 2021-11-06 DIAGNOSIS — F439 Reaction to severe stress, unspecified: Secondary | ICD-10-CM | POA: Diagnosis not present

## 2021-11-06 DIAGNOSIS — M791 Myalgia, unspecified site: Secondary | ICD-10-CM | POA: Diagnosis not present

## 2021-11-06 DIAGNOSIS — R030 Elevated blood-pressure reading, without diagnosis of hypertension: Secondary | ICD-10-CM

## 2021-11-06 DIAGNOSIS — J45909 Unspecified asthma, uncomplicated: Secondary | ICD-10-CM

## 2021-11-06 DIAGNOSIS — R002 Palpitations: Secondary | ICD-10-CM

## 2021-11-06 DIAGNOSIS — E538 Deficiency of other specified B group vitamins: Secondary | ICD-10-CM | POA: Diagnosis not present

## 2021-11-06 DIAGNOSIS — F419 Anxiety disorder, unspecified: Secondary | ICD-10-CM | POA: Diagnosis not present

## 2021-11-06 DIAGNOSIS — R7989 Other specified abnormal findings of blood chemistry: Secondary | ICD-10-CM

## 2021-11-06 DIAGNOSIS — F32A Depression, unspecified: Secondary | ICD-10-CM

## 2021-11-06 DIAGNOSIS — Z13220 Encounter for screening for lipoid disorders: Secondary | ICD-10-CM

## 2021-11-06 MED ORDER — CITALOPRAM HYDROBROMIDE 10 MG PO TABS: 10.0000 mg | ORAL_TABLET | Freq: Every day | ORAL | 1 refills | Status: DC

## 2021-11-06 NOTE — Patient Instructions (Addendum)
Please call your rheumatologist and cardiologist as discussed;  ? ?Talk to your weight loss provider about [facility];  ?

## 2021-11-06 NOTE — Progress Notes (Signed)
?Kelsey [name] is a 46 y.o. female with the following history as recorded in EpicCare:  ?Patient Active Problem List  ? Diagnosis Date Noted  ? Vomiting without nausea 10/19/2020  ? Iron deficiency anemia 10/19/2020  ? Gastroesophageal reflux disease 10/19/2020  ? Change in bowel habit 10/19/2020  ? Sinus problem   ? Bipolar disorder ([place])   ? Anemia   ? Daytime somnolence 04/19/2020  ? Symptomatic PVCs 04/19/2020  ? History of COVID-19 03/16/2020  ? Palpitations 02/25/2020  ? Essential hypertension 02/25/2020  ? Family history of long QT syndrome 02/25/2020  ? Rheumatoid factor positive 03/23/2018  ? Acquired hypothyroidism 02/12/2018  ? Asthma with allergic rhinitis 12/12/2014  ? Vitamin D deficiency 12/12/2014  ? Nevus of finger 09/14/2013  ? Migraine, unspecified, without mention of intractable migraine without mention of status migrainosus 06/21/2013  ? Bulimia nervosa 06/21/2013  ? Routine general medical examination at a health care facility 05/13/2012  ? Contact dermatitis 05/04/2012  ? Bunion of great toe 12/26/2010  ? 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 Allergic rhinitis 07/16/2010  ? MYALGIA 06/25/2010  ? VERTIGO 05/07/2010  ? LOW BACK PAIN SYNDROME 03/02/2010  ? NEOPLASM OF UNCERTAIN BEHAVIOR OF SKIN 10/03/2008  ? Pyelonephritis 2008  ?  ?Current Outpatient Medications  ?Medication Sig Dispense Refill  ? albuterol (VENTOLIN HFA) 108 (90 Base) MCG/ACT inhaler SMARTSIG:2 Puff(s) By Mouth Every 4 Hours PRN    ? Betamethasone Valerate 0.12 % foam Apply topically BID 100 g 3  ? celecoxib (CELEBREX) 200 MG capsule Take 200 mg by mouth 2 (two) times daily.    ? clotrimazole-betamethasone (LOTRISONE) cream Apply 1 application topically daily. 30 g 0  ? cyanocobalamin (,VITAMIN B-12,) 1000 MCG/ML injection Inject 1 mL (1,000 mcg total) into the muscle every 30 (thirty) days. 10 mL 0  ? cyclobenzaprine (FLEXERIL) 10 MG tablet Take 1 tablet (10 mg total) by mouth at bedtime. 90 tablet 0  ?  levonorgestrel (MIRENA) 20 MCG/24HR IUD 1 each by Intrauterine route once.    ? phentermine (ADIPEX-P) 37.5 MG tablet Take 18.75-37.5 mg by mouth daily. Taking 1/2 tab daily.    ? topiramate (TOPAMAX) 25 MG tablet SMARTSIG:1 Tablet(s) By Mouth Every Evening    ? citalopram (CELEXA) 10 MG tablet Take 1 tablet (10 mg total) by mouth daily. 90 tablet 1  ? ?No current facility-administered medications for this visit.  ?  ?Allergies: Morphine and related, Sulfa antibiotics, and Oseltamivir  ?Past Medical History:  ?Diagnosis Date  ? Abnormal Pap smear   ? Anemia   ? Bipolar disorder ([place])   ? Chronic sinusitis   ? Headache(784.0)   ? Pyelonephritis 2008  ? Pylo and stones in the past  ? Sinus problem   ? sinusitis  ?  ?Past Surgical History:  ?Procedure Laterality Date  ? CESAREAN SECTION    ? CESAREAN SECTION  08/31/2011  ? Procedure: CESAREAN SECTION;  Surgeon: Floyce Stakes. Pamala Hurry, MD;  Location: [facility];  Service: Gynecology;  Laterality: [name];  Repeat cesarean section with delivery of baby   ? COLPOSCOPY    ? KNEE ARTHROSCOPY    ? right knee  ? WISDOM TOOTH EXTRACTION    ?  ?Family History  ?Problem Relation Age of Onset  ? Heart disease Brother   ?     sudden cardiac death  ? Asthma Brother   ? Heart disease Maternal Grandfather 76  ?     MI  ? Coronary artery disease Maternal Grandfather   ?  Diabetes Maternal Grandfather   ? Kidney disease Maternal Grandfather   ? Heart disease Mother   ? Cancer Neg Hx   ? Stroke Neg Hx   ? Hypertension Neg Hx   ?  ?Social History  ? ?Tobacco Use  ? Smoking status: Never  ? Smokeless tobacco: Never  ?Substance Use Topics  ? Alcohol use: No  ?  ?Subjective:  ? ?Presents today as a TOC from another provider; requesting fasting labs to be drawn today;  ?Working with psychiatrist who has recently changed network; chronic PTSD/ anxiety; does see a therapist regularly; asking if PCP can take over Rx for Citalopram;  ?Has been referred to rheumatology with chronic pain- last seen by that  office in 2021; was given Celebrex in 2021 but has not followed up;  ?On Adipex per NP at  [facility]; does have history of eating disorder ( bulimia); admits she is frustrated with inability to lose weight;  ?History of low B12- has taken B12 shots in the past year; due to have level re-checked;  ?Has seen cardiology in the past- was due for OV earlier this year; not currently taking Atenolol;  ?GYN- [facility]; up to date on pap smear; overdue for mammogram;  ? ? ? ? ? ?Objective:  ?Vitals:  ? 11/06/21 0903  ?BP: (!) 144/80  ?Pulse: (!) 103  ?Resp: 18  ?Temp: 97.9 ?F (36.6 ?C)  ?TempSrc: Oral  ?SpO2: 99%  ?Weight: 155 lb (70.3 kg)  ?Height: _0  (1.575 m)  ?  ?General: Well developed, well nourished, in no acute distress  ?Skin : Warm and dry.  ?Head: Normocephalic and atraumatic  ?Eyes: Sclera and conjunctiva clear; pupils round and reactive to light; extraocular movements intact  ?Ears: External normal; canals clear; tympanic membranes normal  ?Oropharynx: Pink, supple. No suspicious lesions  ?Neck: Supple without thyromegaly, adenopathy  ?Lungs: Respirations unlabored; clear to auscultation bilaterally without wheeze, rales, rhonchi  ?CVS exam: normal rate and regular rhythm.  ?Musculoskeletal: No deformities; no active joint inflammation  ?Extremities: No edema, cyanosis, clubbing  ?Vessels: Symmetric bilaterally  ?Neurologic: Alert and oriented; speech intact; face symmetrical; moves all extremities well; CNII-XII intact without focal deficit  ? ? ?Assessment:  ?1. Low vitamin B12 level   ?2. Stress at home   ?3. Anxiety and depression   ?4. Myalgia   ?5. Palpitations   ?6. Elevated BP without diagnosis of hypertension   ?7. Other fatigue   ?8. Low vitamin D level   ?9. Lipid screening   ?10. Asthma with allergic rhinitis without complication, unspecified asthma severity   ?  ?Plan:  ?Update B12 level; follow up to be determined; ?& 3. Good progress made with her therapist; will continue this  relationship; PCP agrees to take over writing Celexa 10 mg; ?4.   ? Possibility of fibromyalgia; encouraged to follow up with her rheumatologist; may need to consider changing from Celexa to Cymbalta;  ?5. & 6. Not currently taking Atenolol; overdue to see her cardiologist; she agrees to call and schedule follow up; ?7.   Check TSH; ?8.   Check Vitamin D level; ?9.   Check lipid panel;  ? ?Overdue for mammogram- planning to call her GYN to get this set up [facility]/ Dr. Pamala Hurry).  ? ? ?No follow-ups on file.  ?Orders Placed This Encounter  ?Procedures  ? CBC with Differential/Platelet  ? Comp Met (CMET)  ? Lipid panel  ? TSH  ? Vitamin D (25  hydroxy)  ? B12  ?  ?Requested Prescriptions  ? ?Signed Prescriptions Disp Refills  ? citalopram (CELEXA) 10 MG tablet 90 tablet 1  ?  Sig: Take 1 tablet (10 mg total) by mouth daily.  ?  ? ?

## 2021-11-07 ENCOUNTER — Encounter: Payer: Self-pay | Admitting: Cardiology

## 2021-11-07 ENCOUNTER — Encounter: Payer: Self-pay | Admitting: Family

## 2021-11-07 LAB — COMPREHENSIVE METABOLIC PANEL
ALT: 21 IU/L (ref 0–32)
AST: 22 IU/L (ref 0–40)
Albumin/Globulin Ratio: 1.6 (ref 1.2–2.2)
Albumin: 4.5 g/dL (ref 3.8–4.8)
Alkaline Phosphatase: 75 IU/L (ref 44–121)
BUN/Creatinine Ratio: 16 (ref 9–23)
BUN: 11 mg/dL (ref 6–24)
Bilirubin Total: 0.3 mg/dL (ref 0.0–1.2)
CO2: 26 mmol/L (ref 20–29)
Calcium: 9.3 mg/dL (ref 8.7–10.2)
Chloride: 102 mmol/L (ref 96–106)
Creatinine, Ser: 0.7 mg/dL (ref 0.57–1.00)
Globulin, Total: 2.8 g/dL (ref 1.5–4.5)
Glucose: 83 mg/dL (ref 70–99)
Potassium: 4.1 mmol/L (ref 3.5–5.2)
Sodium: 139 mmol/L (ref 134–144)
Total Protein: 7.3 g/dL (ref 6.0–8.5)
eGFR: 109 mL/min/{1.73_m2} (ref >59)

## 2021-11-07 LAB — CBC WITH DIFFERENTIAL/PLATELET
Basophils Absolute: 0.1 10*3/uL (ref 0.0–0.2)
Basos: 1 %
EOS (ABSOLUTE): 0.2 10*3/uL (ref 0.0–0.4)
Eos: 4 %
Hematocrit: 42.6 % (ref 34.0–46.6)
Hemoglobin: 14 g/dL (ref 11.1–15.9)
Immature Grans (Abs): 0 10*3/uL (ref 0.0–0.1)
Immature Granulocytes: 0 %
Lymphocytes Absolute: 1.7 10*3/uL (ref 0.7–3.1)
Lymphs: 31 %
MCH: 29.6 pg (ref 26.6–33.0)
MCHC: 32.9 g/dL (ref 31.5–35.7)
MCV: 90 fL (ref 79–97)
Monocytes Absolute: 0.4 10*3/uL (ref 0.1–0.9)
Monocytes: 8 %
Neutrophils Absolute: 3.1 10*3/uL (ref 1.4–7.0)
Neutrophils: 56 %
Platelets: 286 10*3/uL (ref 150–450)
RBC: 4.73 x10E6/uL (ref 3.77–5.28)
RDW: 12.3 % (ref 11.7–15.4)
WBC: 5.5 10*3/uL (ref 3.4–10.8)

## 2021-11-07 LAB — LIPID PANEL
Chol/HDL Ratio: 3 ratio (ref 0.0–4.4)
Cholesterol, Total: 210 mg/dL — ABNORMAL HIGH (ref 100–199)
HDL: 69 mg/dL (ref >39)
LDL Chol Calc (NIH): 134 mg/dL — ABNORMAL HIGH (ref 0–99)
Triglycerides: 40 mg/dL (ref 0–149)
VLDL Cholesterol Cal: 7 mg/dL (ref 5–40)

## 2021-11-07 LAB — TSH: TSH: 1.9 u[IU]/mL (ref 0.450–4.500)

## 2021-11-07 LAB — VITAMIN B12: Vitamin B-12: 1215 pg/mL (ref 232–1245)

## 2021-11-07 LAB — VITAMIN D 25 HYDROXY (VIT D DEFICIENCY, FRACTURES): Vit D, 25-Hydroxy: 50.8 ng/mL (ref 30.0–100.0)

## 2021-11-09 ENCOUNTER — Other Ambulatory Visit: Payer: Self-pay | Admitting: Family

## 2021-11-09 DIAGNOSIS — R29818 Other symptoms and signs involving the nervous system: Secondary | ICD-10-CM

## 2021-11-12 ENCOUNTER — Other Ambulatory Visit: Payer: Self-pay | Admitting: Family

## 2021-11-13 ENCOUNTER — Emergency Department (HOSPITAL_BASED_OUTPATIENT_CLINIC_OR_DEPARTMENT_OTHER): Payer: PRIVATE HEALTH INSURANCE

## 2021-11-13 ENCOUNTER — Emergency Department (HOSPITAL_BASED_OUTPATIENT_CLINIC_OR_DEPARTMENT_OTHER)
Admission: EM | Admit: 2021-11-13 | Discharge: 2021-11-13 | Disposition: A | Discharge status: Home / Self Care | Payer: PRIVATE HEALTH INSURANCE | Attending: Emergency Medicine | Admitting: Emergency Medicine

## 2021-11-13 ENCOUNTER — Encounter (HOSPITAL_BASED_OUTPATIENT_CLINIC_OR_DEPARTMENT_OTHER): Payer: Self-pay | Admitting: Urology

## 2021-11-13 DIAGNOSIS — I10 Essential (primary) hypertension: Secondary | ICD-10-CM | POA: Diagnosis not present

## 2021-11-13 DIAGNOSIS — R519 Headache, unspecified: Secondary | ICD-10-CM | POA: Diagnosis present

## 2021-11-13 MED ORDER — PROCHLORPERAZINE EDISYLATE 10 MG/2ML IJ SOLN
10.0000 mg | Freq: Once | INTRAMUSCULAR | Status: AC
  Administered 2021-11-13: 10 mg via INTRAVENOUS
  Filled 2021-11-13: qty 2

## 2021-11-13 MED ORDER — DIPHENHYDRAMINE HCL 50 MG/ML IJ SOLN
50.0000 mg | Freq: Once | INTRAMUSCULAR | Status: AC
Start: 2021-11-13 — End: 2021-11-13
  Administered 2021-11-13: 50 mg via INTRAVENOUS
  Filled 2021-11-13: qty 1

## 2021-11-13 MED ORDER — LACTATED RINGERS IV BOLUS
1000.0000 mL | Freq: Once | INTRAVENOUS | Status: AC
  Administered 2021-11-13: 1000 mL via INTRAVENOUS

## 2021-11-13 NOTE — ED Provider Notes (Signed)
?[facility] EMERGENCY DEPARTMENT ?Provider Note ? ? ?CSN: [id number] ?Arrival date & time: 11/13/21  1255 ? ?  ? ?History ? ?Chief Complaint  ?Patient presents with  ? Hypertension  ? ? ?Kelsey [name] is a 46 y.o. female with hypertension and migraines who presents to the ED with elevated BP and a headache. The patient states that she has been fatigued for the last few days and today, she suddenly woke up with a headache. She has a history of headaches and migraines and gets these quite frequently, however, she states that this headache today is more severe and feels different. She describes feeling a pressure on both sides of her head that feels like a squeezing pain. She denies any fevers, chills, vision changes, chest pain, SOB, abd pain, vomiting, diarrhea, melena, hematochezia, dysuria, or hematuria. She does endorse nausea that started this morning, although she has not had any vomiting. She also states that she felt lightheaded this morning, but this has resolved. The patient has had vertigo in the past, but states that she did not feel like the room was spinning this morning, but rather, felt like she may pass out. She checked her BP this morning when her headache started and her SBP was elevated to the 150s. She has been prescribed atenolol 25 mg for HTN, however, she has not taken this medication in months, although she did take it this morning when she saw that her BP was elevated. Denies any syncopal episodes, falls, or recent head trauma.  ? ?The history is provided by the patient. No language interpreter was used.  ?Headache ?Pain location:  L temporal and R temporal ?Quality: pressure. ?Radiates to:  Does not radiate ?Onset quality:  Sudden ?Duration:  4 hours ?Timing:  Constant ?Progression:  Improving ?Chronicity:  New ?Similar to prior headaches: no   ?Context: not activity, not exposure to bright light, not eating and not loud noise   ?Relieved by:  Nothing ?Worsened by:   Nothing ?Ineffective treatments:  Acetaminophen, NSAIDs and resting in a darkened room ?Associated symptoms: fatigue and nausea   ?Associated symptoms: no abdominal pain, no blurred vision, no cough, no diarrhea, no dizziness, no fever, no focal weakness, no loss of balance, no photophobia, no syncope, no vomiting and no weakness   ? ?  ? ?Home Medications ?Prior to Admission medications   ?Medication Sig Start Date End Date Taking? Authorizing Provider  ?albuterol (VENTOLIN HFA) 108 (90 Base) MCG/ACT inhaler Inhale 2 puffs into the lungs as needed for wheezing or shortness of breath. 10/13/21  Yes [provider]  ?Ascorbic Acid (VITAMIN C PO) Take 1 tablet by mouth daily.   Yes [provider]  ?celecoxib (CELEBREX) 200 MG capsule Take 200 mg by mouth as needed for mild pain. 06/07/20  Yes [provider]  ?Cholecalciferol (VITAMIN D3 PO) Take 1 tablet by mouth daily.   Yes [provider]  ?citalopram (CELEXA) 10 MG tablet Take 1 tablet (10 mg total) by mouth daily. 11/06/21  Yes Marrian Salvage, [place]  ?cyanocobalamin (,VITAMIN B-12,) 1000 MCG/ML injection Inject 1 mL (1,000 mcg total) into the muscle every 30 (thirty) days. 10/26/20  Yes Midge Minium, MD  ?levonorgestrel (MIRENA) 20 MCG/24HR IUD 1 each by Intrauterine route once.   Yes [provider]  ?Omega-3 Fatty Acids (FISH OIL PO) Take 1 capsule by mouth daily.   Yes [provider]  ?phentermine (ADIPEX-P) 37.5 MG tablet Take 18.75-37.5 mg by mouth daily. 09/23/21  Yes [provider]  ?Pyridoxine HCl (VITAMIN B-6 PO) Take 1 tablet by mouth daily.   Yes [provider]  ?topiramate (TOPAMAX) 25 MG tablet Take 25 mg by mouth every evening. 09/23/21  Yes [provider]  ?   ? ?Allergies    ?Morphine and related, Sulfa antibiotics, and Oseltamivir   ? ?Review of Systems   ?Review of Systems  ?Constitutional:  Positive for fatigue. Negative for fever.  ?Eyes:  Negative for  blurred vision and photophobia.  ?Respiratory:  Negative for cough and shortness of breath.   ?Cardiovascular:  Negative for chest pain and syncope.  ?Gastrointestinal:  Positive for nausea. Negative for abdominal pain, diarrhea and vomiting.  ?Genitourinary:  Negative for dysuria and hematuria.  ?Skin: Negative.   ?Neurological:  Positive for headaches. Negative for dizziness, focal weakness, syncope, facial asymmetry, weakness and loss of balance.  ? ?Physical Exam ?Updated Vital Signs ?BP (!) 136/93   Pulse 81   Temp 98 ?F (36.7 ?C) (Oral)   Resp 10   Ht '5\' 2"'$  (1.575 m)   Wt 70.3 kg   SpO2 97%   BMI 28.35 kg/m?  ?Physical Exam ?Constitutional:   ?   General: She is not in acute distress. ?   Appearance: Normal appearance.  ?HENT:  ?   Head: Normocephalic and atraumatic.  ?Eyes:  ?   Extraocular Movements: Extraocular movements intact.  ?   Pupils: Pupils are equal, round, and reactive to light.  ?Cardiovascular:  ?   Rate and Rhythm: Normal rate and regular rhythm.  ?   Pulses: Normal pulses.  ?   Heart sounds: No murmur heard. ?Pulmonary:  ?   Effort: No respiratory distress.  ?   Breath sounds: Normal breath sounds. No wheezing, rhonchi or rales.  ?Abdominal:  ?   General: There is no distension.  ?   Palpations: Abdomen is soft.  ?   Tenderness: There is no abdominal tenderness.  ?Musculoskeletal:     ?   General: Normal range of motion.  ?   Right lower leg: No edema.  ?   Left lower leg: No edema.  ?Skin: ?   General: Skin is warm and dry.  ?Neurological:  ?   General: No focal deficit present.  ?   Mental Status: She is alert and oriented to person, place, and time. Mental status is at baseline.  ?   Cranial Nerves: No cranial nerve deficit.  ?   Sensory: No sensory deficit.  ?   Motor: No weakness.  ?   Coordination: Coordination normal.  ?Psychiatric:     ?   Mood and Affect: Mood normal.     ?   Behavior: Behavior normal.  ? ? ?ED Results / Procedures / Treatments   ?Labs ?(all labs ordered are  listed, but only abnormal results are displayed) ?Labs Reviewed  ?PREGNANCY, URINE  ? ? ?EKG ?EKG Interpretation ? ?Date/Time:  Tuesday Nov 13 2021 13:07:45 EDT ?Ventricular Rate:  80 ?PR Interval:  130 ?QRS Duration: 88 ?QT Interval:  394 ?QTC Calculation: 505 ?R Axis:   78 ?Text Interpretation: Normal sinus rhythm Normal ECG When compared with ECG of 31-Jan-2020 15:42, PREVIOUS ECG IS PRESENT when compared to prior, slower rate. No STEMI Confirmed by Antony Blackbird [id number]) on 11/13/2021 1:28:48 PM ? ?Radiology ?CT Head Wo Contrast ? ?Result Date: 11/13/2021 ?CLINICAL DATA:  Provided history: Headache, sudden, severe. EXAM: CT HEAD WITHOUT CONTRAST TECHNIQUE: Contiguous axial images were obtained from the  base of the skull through the vertex without intravenous contrast. RADIATION DOSE REDUCTION: This exam was performed according to the departmental dose-optimization program which includes automated exposure control, adjustment of the mA and/or kV according to patient size and/or use of iterative reconstruction technique. COMPARISON:  Head CT 06/17/2006. FINDINGS: Brain: No age-advanced or lobar predominant parenchymal atrophy. Patchy and ill-defined hypoattenuation within the cerebral white matter, advanced for age. There is no acute intracranial hemorrhage. No demarcated cortical infarct. No extra-axial fluid collection. No evidence of an intracranial mass. No midline shift. Vascular: No hyperdense vessel. Skull: No acute fracture or aggressive osseous lesion. Sinuses/Orbits: No mass or acute finding within the imaged orbits. Small fluid level within the left sphenoid sinus. IMPRESSION: No evidence of acute intracranial abnormality. Age-advanced and nonspecific cerebral white matter disease. Left sphenoid sinusitis. Electronically Signed   By: Kellie Simmering D.O.   On: 11/13/2021 15:12   ? ?Procedures ?Procedures  ? ? ?Medications Ordered in ED ?Medications  ?prochlorperazine (COMPAZINE) injection 10 mg (10 mg  Intravenous Given 11/13/21 1420)  ?lactated ringers bolus 1,000 mL (1,000 mLs Intravenous New Bag/Given 11/13/21 1416)  ?diphenhydrAMINE (BENADRYL) injection 50 mg (50 mg Intravenous Given 11/13/21 1418)  ? ? ?ED Course/ Medical

## 2021-11-13 NOTE — ED Triage Notes (Signed)
Pt seen at [facility] for HA and Hypertension, UC sent here ?BP 158/112 at home, 155/113 at UC  ?States HA now and fatigue  ?Took Atenolol this am 25 mg, has not been taking for a couple months per pt  ? ?

## 2021-11-13 NOTE — Discharge Instructions (Addendum)
Dear Ms. Belland, ? ?You were in the ED because of your headache and high blood pressure. You did not have a head bleed and overall, your CT head looked ok. Your BP and headache improved with compazine, benadryl, and some fluids. Please follow up with your new primary care physician regarding your elevated BP and your headaches. Take care! ?

## 2021-11-15 ENCOUNTER — Ambulatory Visit (INDEPENDENT_AMBULATORY_CARE_PROVIDER_SITE_OTHER): Payer: PRIVATE HEALTH INSURANCE | Admitting: Family

## 2021-11-15 ENCOUNTER — Encounter: Payer: Self-pay | Admitting: Family

## 2021-11-15 VITALS — BP 156/92 | HR 89 | Temp 97.9°F | Ht 63.0 in | Wt 156.0 lb

## 2021-11-15 DIAGNOSIS — R519 Headache, unspecified: Secondary | ICD-10-CM

## 2021-11-15 DIAGNOSIS — I10 Essential (primary) hypertension: Secondary | ICD-10-CM | POA: Diagnosis not present

## 2021-11-15 MED ORDER — ATENOLOL 50 MG PO TABS: 50.0000 mg | ORAL_TABLET | Freq: Every day | ORAL | 0 refills | Status: DC

## 2021-11-15 NOTE — Progress Notes (Signed)
?Kelsey [name] is a 46 y.o. female with the following history as recorded in EpicCare:  ?Patient Active Problem List  ? Diagnosis Date Noted  ? Vomiting without nausea 10/19/2020  ? Iron deficiency anemia 10/19/2020  ? Gastroesophageal reflux disease 10/19/2020  ? Change in bowel habit 10/19/2020  ? Sinus problem   ? Bipolar disorder ([place])   ? Anemia   ? Daytime somnolence 04/19/2020  ? Symptomatic PVCs 04/19/2020  ? History of COVID-19 03/16/2020  ? Palpitations 02/25/2020  ? Essential hypertension 02/25/2020  ? Family history of long QT syndrome 02/25/2020  ? Rheumatoid factor positive 03/23/2018  ? Acquired hypothyroidism 02/12/2018  ? Asthma with allergic rhinitis 12/12/2014  ? Vitamin D deficiency 12/12/2014  ? Nevus of finger 09/14/2013  ? Migraine, unspecified, without mention of intractable migraine without mention of status migrainosus 06/21/2013  ? Bulimia nervosa 06/21/2013  ? Routine general medical examination at a health care facility 05/13/2012  ? Contact dermatitis 05/04/2012  ? Bunion of great toe 12/26/2010  ? 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 Allergic rhinitis 07/16/2010  ? MYALGIA 06/25/2010  ? VERTIGO 05/07/2010  ? LOW BACK PAIN SYNDROME 03/02/2010  ? NEOPLASM OF UNCERTAIN BEHAVIOR OF SKIN 10/03/2008  ? Pyelonephritis 2008  ?  ?Current Outpatient Medications  ?Medication Sig Dispense Refill  ? albuterol (VENTOLIN HFA) 108 (90 Base) MCG/ACT inhaler Inhale 2 puffs into the lungs as needed for wheezing or shortness of breath.    ? Ascorbic Acid (VITAMIN C PO) Take 1 tablet by mouth daily.    ? celecoxib (CELEBREX) 200 MG capsule Take 200 mg by mouth as needed for mild pain.    ? Cholecalciferol (VITAMIN D3 PO) Take 1 tablet by mouth daily.    ? citalopram (CELEXA) 10 MG tablet Take 1 tablet (10 mg total) by mouth daily. 90 tablet 1  ? cyanocobalamin (,VITAMIN B-12,) 1000 MCG/ML injection Inject 1 mL (1,000 mcg total) into the muscle every 30 (thirty) days. 10 mL 0  ?  levonorgestrel (MIRENA) 20 MCG/24HR IUD 1 each by Intrauterine route once.    ? Omega-3 Fatty Acids (FISH OIL PO) Take 1 capsule by mouth daily.    ? Pyridoxine HCl (VITAMIN B-6 PO) Take 1 tablet by mouth daily.    ? atenolol (TENORMIN) 50 MG tablet Take 1 tablet (50 mg total) by mouth daily. 90 tablet 0  ? phentermine (ADIPEX-P) 37.5 MG tablet Take 18.75-37.5 mg by mouth daily. (Patient not taking: Reported on 11/15/2021)    ? topiramate (TOPAMAX) 25 MG tablet Take 25 mg by mouth every evening. (Patient not taking: Reported on 11/15/2021)    ? ?No current facility-administered medications for this visit.  ?  ?Allergies: Morphine and related, Sulfa antibiotics, and Oseltamivir  ?Past Medical History:  ?Diagnosis Date  ? Abnormal Pap smear   ? Anemia   ? Bipolar disorder ([place])   ? Chronic sinusitis   ? Headache(784.0)   ? Pyelonephritis 2008  ? Pylo and stones in the past  ? Sinus problem   ? sinusitis  ?  ?Past Surgical History:  ?Procedure Laterality Date  ? CESAREAN SECTION    ? CESAREAN SECTION  08/31/2011  ? Procedure: CESAREAN SECTION;  Surgeon: Floyce Stakes. Pamala Hurry, MD;  Location: [facility];  Service: Gynecology;  Laterality: [name];  Repeat cesarean section with delivery of baby   ? COLPOSCOPY    ? KNEE ARTHROSCOPY    ? right knee  ? WISDOM TOOTH EXTRACTION    ?  ?Family History  ?  Problem Relation Age of Onset  ? Heart disease Brother   ?     sudden cardiac death  ? Asthma Brother   ? Heart disease Maternal Grandfather 88  ?     MI  ? Coronary artery disease Maternal Grandfather   ? Diabetes Maternal Grandfather   ? Kidney disease Maternal Grandfather   ? Heart disease Mother   ? Cancer Neg Hx   ? Stroke Neg Hx   ? Hypertension Neg Hx   ?  ?Social History  ? ?Tobacco Use  ? Smoking status: Never  ? Smokeless tobacco: Never  ?Substance Use Topics  ? Alcohol use: No  ?  ?Subjective:  ? ?Accompanied by husband; was seen at ER earlier this week with severe headache/ sudden blood pressure spike; has been prescribed Atenolol  in the past but had not been taking regularly; has gotten back on 25 mg since ER visit on Monday; does have cardiology relationship- in the process of getting sleep study done that was recommended previously by cardiology;  ?Has not been on Topamax or Adipex since 11/06/2021;  ? ? ? ? ?Objective:  ?Vitals:  ? 11/15/21 1559 11/15/21 1609  ?BP: (!) 160/98 (!) 156/92  ?Pulse: 89   ?Temp: 97.9 ?F (36.6 ?C)   ?TempSrc: Oral   ?SpO2: 98%   ?Weight: 156 lb (70.8 kg)   ?Height: '5\' 3"'$  (1.6 m)   ?  ?General: Well developed, well nourished, in no acute distress  ?Skin : Warm and dry.  ?Head: Normocephalic and atraumatic  ?Eyes: Sclera and conjunctiva clear; pupils round and reactive to light; extraocular movements intact  ?Ears: External normal; canals clear; tympanic membranes normal  ?Oropharynx: Pink, supple. No suspicious lesions  ?Neck: Supple without thyromegaly, adenopathy  ?Lungs: Respirations unlabored; clear to auscultation bilaterally without wheeze, rales, rhonchi  ?CVS exam: normal rate and regular rhythm.  ?Neurologic: Alert and oriented; speech intact; face symmetrical; moves all extremities well; CNII-XII intact without focal deficit  ? ?Assessment:  ?1. Primary hypertension   ?2. Nonintractable headache, unspecified chronicity pattern, unspecified headache type   ?  ?Plan:  ?? If this was migraine that caused blood pressure to spike suddenly; will increase Atenolol back to 50 mg; this was most recent dose prescribed by cardiology; discussed that this class of drug can help with blood pressure and migraine management; encouraged follow up with cardiology and patient is also in the process of getting her sleep study scheduled; may need neurology consult; ?Follow up in 3 months to re-evaluate, sooner prn.  ? ?No follow-ups on file.  ?Orders Placed This Encounter  ?Procedures  ? Ambulatory referral to Cardiology  ?  Referral Priority:   Urgent  ?  Referral Type:   Consultation  ?  Referral Reason:   Specialty  Services Required  ?  Referred to Provider:   Berniece Salines, DO  ?  Requested Specialty:   Cardiology  ?  Number of Visits Requested:   1  ?  ?Requested Prescriptions  ? ?Signed Prescriptions Disp Refills  ? atenolol (TENORMIN) 50 MG tablet 90 tablet 0  ?  Sig: Take 1 tablet (50 mg total) by mouth daily.  ?  ? ?

## 2021-11-23 ENCOUNTER — Encounter: Payer: Self-pay | Admitting: Cardiology

## 2021-11-23 ENCOUNTER — Ambulatory Visit (INDEPENDENT_AMBULATORY_CARE_PROVIDER_SITE_OTHER): Payer: PRIVATE HEALTH INSURANCE | Admitting: Cardiology

## 2021-11-23 VITALS — BP 146/100 | HR 71 | Ht 63.0 in | Wt 155.4 lb

## 2021-11-23 DIAGNOSIS — G4733 Obstructive sleep apnea (adult) (pediatric): Secondary | ICD-10-CM

## 2021-11-23 DIAGNOSIS — R635 Abnormal weight gain: Secondary | ICD-10-CM | POA: Diagnosis not present

## 2021-11-23 DIAGNOSIS — I10 Essential (primary) hypertension: Secondary | ICD-10-CM

## 2021-11-23 MED ORDER — CARVEDILOL 12.5 MG PO TABS: 12.5000 mg | ORAL_TABLET | Freq: Two times a day (BID) | ORAL | 3 refills | Status: DC

## 2021-11-23 NOTE — Progress Notes (Signed)
Cardiology Office Note:    Date:  11/23/2021   ID:  Kelsey [name], DOB 1976-04-02, MRN [id number]  PCP:  Marrian Salvage, FNP  Cardiologist:  Berniece Salines, DO  Electrophysiologist:  None   Referring MD: Marrian Salvage,*   " I am having headache"   History of Present Illness:    Kelsey [name] is a 46 y.o. female with a hx of family history of long QT syndrome with a brother who died acutely after his alarm clock sounded, this was how the family got diagnosed.  The patient tells me she followed with the [facility] for many years and I was able to review this record in 2011 she was discharged from the [facility] long QT clinic with a diagnosis of sinus arrhythmia.  At that time her QTC was 383.  She was taken off beta-blockers as well.   Since her discharge from [facility] in 2011 she has not seen a cardiologist.  She has been following her PCP.  I did see the patient initially on February 25, 2020 at that time she was reporting palpitations as well as [place] of dizziness.  Today today visit I placed a monitor and the patient and got an echocardiogram to reassess her LV function.  Due to being hypertensive I started the patient on atenolol 25 mg daily.  Blood work was done due to her fatigue for vitamin B12 as well as vitamin D levels.   I saw the patient April 19, 2020 at that time she was experiencing symptomatic PVCs and she was hypertensive I increase her atenolol to 50 mg daily.  Hoping will help with her elevated blood pressure.  I also started patient on vitamin D giving that her vitamin D levels were low normal.   I recommended she undergo a sleep study and also suspected clinical depression playing a role for this.   I last saw the patient in February 2022 at that time her blood pressure had improved and we kept the patient on her atenolol.  We discussed getting a sleep study.  She had gotten a sleep study but needed to have sleep titration.   This is still pending.  Today she tells me that she has been experiencing headaches.   Past Medical History:  Diagnosis Date   Abnormal Pap smear    Anemia    Bipolar disorder ([place])    Chronic sinusitis    Headache(784.0)    Pyelonephritis 2008   Pylo and stones in the past   Sinus problem    sinusitis    Past Surgical History:  Procedure Laterality Date   CESAREAN SECTION     CESAREAN SECTION  08/31/2011   Procedure: CESAREAN SECTION;  Surgeon: [name], MD;  Location: [facility];  Service: Gynecology;  Laterality: [name];  Repeat cesarean section with delivery of baby    COLPOSCOPY     KNEE ARTHROSCOPY     right knee   WISDOM TOOTH EXTRACTION      Current Medications: Current Meds  Medication Sig   albuterol (VENTOLIN HFA) 108 (90 Base) MCG/ACT inhaler Inhale 2 puffs into the lungs as needed for wheezing or shortness of breath.   Ascorbic Acid (VITAMIN C PO) Take 1 tablet by mouth daily.   carvedilol (COREG) 12.5 MG tablet Take 1 tablet (12.5 mg total) by mouth 2 (two) times daily.   celecoxib (CELEBREX) 200 MG capsule Take 200 mg by mouth as needed for mild pain.  Cholecalciferol (VITAMIN D3 PO) Take 1 tablet by mouth daily.   citalopram (CELEXA) 10 MG tablet Take 1 tablet (10 mg total) by mouth daily.   cyanocobalamin (,VITAMIN B-12,) 1000 MCG/ML injection Inject 1 mL (1,000 mcg total) into the muscle every 30 (thirty) days.   levonorgestrel (MIRENA) 20 MCG/24HR IUD 1 each by Intrauterine route once.   Omega-3 Fatty Acids (FISH OIL PO) Take 1 capsule by mouth daily.   Pyridoxine HCl (VITAMIN B-6 PO) Take 1 tablet by mouth daily.   [DISCONTINUED] atenolol (TENORMIN) 50 MG tablet Take 1 tablet (50 mg total) by mouth daily.     Allergies:   Morphine and related, Sulfa antibiotics, and Oseltamivir   Social History   Socioeconomic History   Marital status: Married    Spouse name: Not on file   Number of children: 2   Years of education: Not on file   Highest  education level: Not on file  Occupational History   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  Tobacco Use   Smoking status: Never   Smokeless tobacco: Never  Substance and Sexual Activity   Alcohol use: No   Drug use: No   Sexual activity: Yes  Other Topics Concern   Not on file  Social History Narrative   1 daughter--[name] born 12/09, 1 step son   Social Determinants of Health   Financial Resource Strain: Not on file  Food Insecurity: Not on file  Transportation Needs: Not on file  Physical Activity: Not on file  Stress: Not on file  Social Connections: Not on file     Family History: The patient's family history includes Asthma in her brother; Coronary artery disease in her maternal grandfather; Diabetes in her maternal grandfather; Heart disease in her brother and mother; Heart disease (age of onset: 56) in her maternal grandfather; Kidney disease in her maternal grandfather. There is no history of Cancer, Stroke, or Hypertension.  ROS:   Review of Systems  Constitution: Negative for decreased appetite, fever and weight gain.  HENT: Negative for congestion, ear discharge, hoarse voice and sore throat.   Eyes: Negative for discharge, redness, vision loss in right eye and visual halos.  Cardiovascular: Negative for chest pain, dyspnea on exertion, leg swelling, orthopnea and palpitations.  Respiratory: Negative for cough, hemoptysis, shortness of breath and snoring.   Endocrine: Negative for heat intolerance and polyphagia.  Hematologic/Lymphatic: Negative for bleeding problem. Does not bruise/bleed easily.  Skin: Negative for flushing, nail changes, rash and suspicious lesions.  Musculoskeletal: Negative for arthritis, joint pain, muscle cramps, myalgias, neck pain and stiffness.  Gastrointestinal: Negative for abdominal pain, bowel incontinence, diarrhea and excessive appetite.  Genitourinary: Negative for decreased libido, genital sores and  incomplete emptying.  Neurological: Negative for brief paralysis, focal weakness, headaches and loss of balance.  Psychiatric/Behavioral: Negative for altered mental status, depression and suicidal ideas.  Allergic/Immunologic: Negative for HIV exposure and persistent infections.    EKGs/Labs/Other Studies Reviewed:    The following studies were reviewed today:   EKG:  The ekg ordered today demonstrates sinus rhythm  Recent Labs: 11/06/2021: ALT 21; BUN 11; Creatinine, Ser 0.70; Hemoglobin 14.0; Platelets 286; Potassium 4.1; Sodium 139; TSH 1.900  Recent Lipid Panel    Component Value Date/Time   CHOL 210 (H) 11/06/2021 0939   TRIG 40 11/06/2021 0939   HDL 69 11/06/2021 0939   CHOLHDL 3.0 11/06/2021 0939   CHOLHDL 3 06/19/2020 1303   VLDL 9.0 06/19/2020 1303  LDLCALC 134 (H) 11/06/2021 5400    Physical Exam:    VS:  BP (!) 146/100   Pulse 71   Ht '5\' 3"'$  (1.6 m)   Wt 155 lb 6.4 oz (70.5 kg)   SpO2 99%   BMI 27.53 kg/m     Wt Readings from Last 3 Encounters:  11/23/21 155 lb 6.4 oz (70.5 kg)  11/15/21 156 lb (70.8 kg)  11/13/21 155 lb (70.3 kg)     GEN: Well nourished, well developed in no acute distress HEENT: Normal NECK: No JVD; No carotid bruits LYMPHATICS: No lymphadenopathy CARDIAC: S1S2 noted,RRR, no murmurs, rubs, gallops RESPIRATORY:  Clear to auscultation without rales, wheezing or rhonchi  ABDOMEN: Soft, non-tender, non-distended, +bowel sounds, no guarding. EXTREMITIES: No edema, No cyanosis, no clubbing MUSCULOSKELETAL:  No deformity  SKIN: Warm and dry NEUROLOGIC:  Alert and oriented x 3, non-focal PSYCHIATRIC:  Normal affect, good insight  ASSESSMENT:    1. Essential hypertension   2. Weight gain   3. OSA (obstructive sleep apnea)    PLAN:     She is hypertensive in the office today.  Blood pressure also manually taken by me 142/98 millimeters mercury.  What I like to do is transition the patient off atenolol and start carvedilol 12.5 mg  twice a day.  I have advised the patient to cut back on increasing sodium. She tells me that she is planning sleep study with an outside facility.  She disseminate information once this is done. All of her questions has been answered. We also talked about weight gain and ways to help with that.  Refer the patient to our medical weight management program.   The patient is in agreement with the above plan. The patient left the office in stable condition.  The patient will follow up in 4 months or sooner if needed.   Medication Adjustments/Labs and Tests Ordered: Current medicines are reviewed at length with the patient today.  Concerns regarding medicines are outlined above.  Orders Placed This Encounter  Procedures   Amb Ref to Medical Weight Management   Meds ordered this encounter  Medications   carvedilol (COREG) 12.5 MG tablet    Sig: Take 1 tablet (12.5 mg total) by mouth 2 (two) times daily.    Dispense:  180 tablet    Refill:  3    Patient Instructions  Medication Instructions:  Your physician has recommended you make the following change in your medication:  STOP: Atenolol  START: Coreg 12.5 mg twice daily  Please take your blood pressure daily for 2 weeks and send in a MyChart message. Please include heart rates.   HOW TO TAKE YOUR BLOOD PRESSURE: Rest 5 minutes before taking your blood pressure. Don't smoke or drink caffeinated beverages for at least 30 minutes before. Take your blood pressure before (not after) you eat. Sit comfortably with your back supported and both feet on the floor (don't cross your legs). Elevate your arm to heart level on a table or a desk. Use the proper sized cuff. It should fit smoothly and snugly around your bare upper arm. There should be enough room to slip a fingertip under the cuff. The bottom edge of the cuff should be 1 inch above the crease of the elbow. Ideally, take 3 measurements at one sitting and record the average.  *If you  need a refill on your cardiac medications before your next appointment, please call your pharmacy*   Lab Work: None If you have labs (  blood work) drawn today and your tests are completely normal, you will receive your results only by: [place] (if you have MyChart) OR A paper copy in the mail If you have any lab test that is abnormal or we need to change your treatment, we will call you to review the results.   Testing/Procedures: None   Follow-Up: At [facility] [place], you and your health needs are our priority.  As part of our continuing mission to provide you with exceptional heart care, we have created designated Provider Care Teams.  These Care Teams include your primary Cardiologist (physician) and Advanced Practice Providers (APPs -  Physician Assistants and Nurse Practitioners) who all work together to provide you with the care you need, when you need it.  We recommend signing up for the patient portal called "MyChart".  Sign up information is provided on this After Visit Summary.  MyChart is used to connect with patients for Virtual Visits (Telemedicine).  Patients are able to view lab/test results, encounter notes, upcoming appointments, etc.  Non-urgent messages can be sent to your provider as well.   To learn more about what you can do with MyChart, go to [email or web address].    Your next appointment:   4 month(s)  The format for your next appointment:   In Person  Provider:   Berniece Salines, DO     Other Instructions   Important Information About Sugar         Adopting a Healthy Lifestyle.  Know what a healthy weight is for you (roughly BMI <25) and aim to maintain this   Aim for 7+ servings of fruits and vegetables daily   65-80+ fluid ounces of water or unsweet tea for healthy kidneys   Limit to max 1 drink of alcohol per day; avoid smoking/tobacco   Limit animal fats in diet for cholesterol and heart health - choose grass fed whenever  available   Avoid highly processed foods, and foods high in saturated/trans fats   Aim for low stress - take time to unwind and care for your mental health   Aim for 150 min of moderate intensity exercise weekly for heart health, and weights twice weekly for bone health   Aim for 7-9 hours of sleep daily   When it comes to diets, agreement about the perfect plan isnt easy to find, even among the experts. Experts at the [employer] developed an idea known as the Healthy Eating Plate. Just imagine a plate divided into logical, healthy portions.   The emphasis is on diet quality:   Load up on vegetables and fruits - one-half of your plate: Aim for color and variety, and remember that potatoes dont count.   Go for whole grains - one-quarter of your plate: Whole wheat, barley, wheat berries, quinoa, oats, brown rice, and foods made with them. If you want pasta, go with whole wheat pasta.   Protein power - one-quarter of your plate: Fish, chicken, beans, and nuts are all healthy, versatile protein sources. Limit red meat.   The diet, however, does go beyond the plate, offering a few other suggestions.   Use healthy plant oils, such as olive, canola, soy, corn, sunflower and peanut. Check the labels, and avoid partially hydrogenated oil, which have unhealthy trans fats.   If youre thirsty, drink water. Coffee and tea are good in moderation, but skip sugary drinks and limit milk and dairy products to one or two daily servings.   The type of  carbohydrate in the diet is more important than the amount. Some sources of carbohydrates, such as vegetables, fruits, whole grains, and beans-are healthier than others.   Finally, stay active  Signed, Berniece Salines, DO  11/23/2021 7:52 PM    [facility]

## 2021-11-23 NOTE — Patient Instructions (Addendum)
Medication Instructions:  Your physician has recommended you make the following change in your medication:  STOP: Atenolol  START: Coreg 12.5 mg twice daily  Please take your blood pressure daily for 2 weeks and send in a MyChart message. Please include heart rates.   HOW TO TAKE YOUR BLOOD PRESSURE: Rest 5 minutes before taking your blood pressure. Don't smoke or drink caffeinated beverages for at least 30 minutes before. Take your blood pressure before (not after) you eat. Sit comfortably with your back supported and both feet on the floor (don't cross your legs). Elevate your arm to heart level on a table or a desk. Use the proper sized cuff. It should fit smoothly and snugly around your bare upper arm. There should be enough room to slip a fingertip under the cuff. The bottom edge of the cuff should be 1 inch above the crease of the elbow. Ideally, take 3 measurements at one sitting and record the average.  *If you need a refill on your cardiac medications before your next appointment, please call your pharmacy*   Lab Work: None If you have labs (blood work) drawn today and your tests are completely normal, you will receive your results only by: [place] (if you have MyChart) OR A paper copy in the mail If you have any lab test that is abnormal or we need to change your treatment, we will call you to review the results.   Testing/Procedures: None   Follow-Up: At [facility], you and your health needs are our priority.  As part of our continuing mission to provide you with exceptional heart care, we have created designated Provider Care Teams.  These Care Teams include your primary Cardiologist (physician) and Advanced Practice Providers (APPs -  Physician Assistants and Nurse Practitioners) who all work together to provide you with the care you need, when you need it.  We recommend signing up for the patient portal called "MyChart".  Sign up information is provided on  this After Visit Summary.  MyChart is used to connect with patients for Virtual Visits (Telemedicine).  Patients are able to view lab/test results, encounter notes, upcoming appointments, etc.  Non-urgent messages can be sent to your provider as well.   To learn more about what you can do with MyChart, go to [email or web address].    Your next appointment:   4 month(s)  The format for your next appointment:   In Person  Provider:   Berniece Salines, DO     Other Instructions   Important Information About Sugar

## 2021-12-07 ENCOUNTER — Encounter: Payer: Self-pay | Admitting: Cardiology

## 2021-12-10 MED ORDER — VALSARTAN 40 MG PO TABS: 40.0000 mg | ORAL_TABLET | Freq: Every day | ORAL | 1 refills | Status: DC

## 2022-02-19 ENCOUNTER — Encounter: Payer: Self-pay | Admitting: Family

## 2022-02-19 ENCOUNTER — Ambulatory Visit: Payer: PRIVATE HEALTH INSURANCE | Admitting: Family

## 2022-02-19 VITALS — BP 124/82 | HR 96 | Temp 98.4°F | Ht 63.0 in | Wt 162.2 lb

## 2022-02-19 DIAGNOSIS — I10 Essential (primary) hypertension: Secondary | ICD-10-CM

## 2022-02-19 DIAGNOSIS — L409 Psoriasis, unspecified: Secondary | ICD-10-CM

## 2022-02-19 DIAGNOSIS — M791 Myalgia, unspecified site: Secondary | ICD-10-CM

## 2022-02-19 MED ORDER — BETAMETHASONE VALERATE 0.12 % EX FOAM: CUTANEOUS | 1 refills | Status: DC

## 2022-02-19 NOTE — Progress Notes (Signed)
Kelsey [name] is a 46 y.o. 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   History of COVID-19 03/16/2020   Palpitations 02/25/2020   Essential hypertension 02/25/2020   Family history of long QT syndrome 02/25/2020   Rheumatoid factor positive 03/23/2018   Acquired hypothyroidism 02/12/2018   Asthma with allergic rhinitis 12/12/2014   Vitamin D deficiency 12/12/2014   Nevus of finger 09/14/2013   Migraine, unspecified, without mention of intractable migraine without mention of status migrainosus 06/21/2013   Bulimia nervosa 06/21/2013   Routine general medical examination at a health care facility 05/13/2012   Contact dermatitis 05/04/2012   Bunion of great toe 12/26/2010   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     Ascorbic Acid (VITAMIN C PO) Take 1 tablet by mouth daily.     Betamethasone Valerate 0.12 % foam Use daily as directed 100 g 1   carvedilol (COREG) 12.5 MG tablet Take 1 tablet (12.5 mg total) by mouth 2 (two) times daily. 180 tablet 3   celecoxib (CELEBREX) 200 MG capsule Take 200 mg by mouth as needed for mild pain.     Cholecalciferol (VITAMIN D3 PO) Take 1 tablet by mouth  daily.     citalopram (CELEXA) 10 MG tablet Take 1 tablet (10 mg total) by mouth daily. 90 tablet 1   levonorgestrel (MIRENA) 20 MCG/24HR IUD 1 each by Intrauterine route once.     Omega-3 Fatty Acids (FISH OIL PO) Take 1 capsule by mouth daily.     Pyridoxine HCl (VITAMIN B-6 PO) Take 1 tablet by mouth daily.     cyanocobalamin (,VITAMIN B-12,) 1000 MCG/ML injection Inject 1 mL (1,000 mcg total) into the muscle every 30 (thirty) days. (Patient not taking: Reported on 02/19/2022) 10 mL 0   valsartan (DIOVAN) 40 MG tablet Take 1 tablet (40 mg total) by mouth daily. (Patient not taking: Reported on 02/19/2022) 90 tablet 1   No current facility-administered medications for this visit.    Allergies: Morphine and related, Sulfa antibiotics, and Oseltamivir  Past Medical History:  Diagnosis Date   Abnormal Pap smear    Anemia    Bipolar disorder ([place])    Chronic sinusitis    Headache(784.0)    Pyelonephritis 2008   Pylo and stones in the past   Sinus problem    sinusitis    Past Surgical History:  Procedure Laterality Date   CESAREAN SECTION     CESAREAN SECTION  08/31/2011   Procedure: CESAREAN SECTION;  Surgeon: [name], MD;  Location: [facility];  Service: Gynecology;  Laterality: [name];  Repeat cesarean section with delivery of baby    COLPOSCOPY     KNEE ARTHROSCOPY  right knee   WISDOM TOOTH EXTRACTION      Family History  Problem Relation Age of Onset   Heart disease Brother        sudden cardiac death   Asthma Brother    Heart disease Maternal Grandfather 76       MI   Coronary artery disease Maternal Grandfather    Diabetes Maternal Grandfather    Kidney disease Maternal Grandfather    Heart disease Mother    Cancer Neg Hx    Stroke Neg Hx    Hypertension Neg Hx     Social History   Tobacco Use   Smoking status: Never   Smokeless tobacco: Never  Substance Use Topics   Alcohol use: No    Subjective:   3 month follow up on chronic care needs; Has been  working with cardiology for management of her blood pressure; Considering seeing Integrative Provider- has concerns that her health issues are related to a mold toxicity in her system;    Objective:  Vitals:   02/19/22 1520  BP: 124/82  Pulse: 96  Temp: 98.4 F (36.9 C)  TempSrc: Oral  SpO2: 98%  Weight: 162 lb 3.2 oz (73.6 kg)  Height: '5\' 3"'$  (1.6 m)    General: Well developed, well nourished, in no acute distress  Skin : Warm and dry.  Head: Normocephalic and atraumatic  Lungs: Respirations unlabored;  Neurologic: Alert and oriented; speech intact; face symmetrical; moves all extremities well; CNII-XII intact without focal deficit   Assessment:  1. Myalgia   2. Essential hypertension   3. Psoriasis     Plan:  Fibromyalgia and rheumatology follow up were discussed previously; will repeat labs today- assuming normal, she will proceed with seeing Integrative provider;  2.  Stable- continue same medication/ follow up with cardiology; 3.   Refill updated as requested on scalp foam;  No follow-ups on file.  Orders Placed This Encounter  Procedures   Antinuclear Antib (ANA)   Sedimentation rate   Rheumatoid Factor    Requested Prescriptions   Signed Prescriptions Disp Refills   Betamethasone Valerate 0.12 % foam 100 g 1    Sig: Use daily as directed

## 2022-02-20 LAB — ANA: Anti Nuclear Antibody (ANA): NEGATIVE

## 2022-02-20 LAB — SEDIMENTATION RATE: Sed Rate: 4 mm/hr (ref 0–32)

## 2022-02-20 LAB — RHEUMATOID FACTOR: Rheumatoid fact SerPl-aCnc: <10 IU/mL (ref <14.0)

## 2022-03-15 ENCOUNTER — Ambulatory Visit: Payer: PRIVATE HEALTH INSURANCE | Admitting: Cardiology

## 2022-04-26 ENCOUNTER — Ambulatory Visit: Payer: PRIVATE HEALTH INSURANCE | Attending: Cardiology | Admitting: Cardiology

## 2022-04-26 ENCOUNTER — Encounter: Payer: Self-pay | Admitting: Cardiology

## 2022-04-26 VITALS — BP 144/100 | HR 82 | Ht 63.0 in | Wt 156.0 lb

## 2022-04-26 DIAGNOSIS — I493 Ventricular premature depolarization: Secondary | ICD-10-CM

## 2022-04-26 DIAGNOSIS — I10 Essential (primary) hypertension: Secondary | ICD-10-CM

## 2022-04-26 DIAGNOSIS — G8929 Other chronic pain: Secondary | ICD-10-CM | POA: Diagnosis not present

## 2022-04-26 MED ORDER — VALSARTAN 40 MG PO TABS: 40.0000 mg | ORAL_TABLET | Freq: Two times a day (BID) | ORAL | 3 refills | Status: DC

## 2022-04-26 NOTE — Patient Instructions (Signed)
Medication Instructions:  Your physician has recommended you make the following change in your medication:  INCREASE: Valsartan '40mg'$  Twice Daily.  *If you need a refill on your cardiac medications before your next appointment, please call your pharmacy*  Please take your blood pressure daily for 2 weeks and send in a MyChart message. Please include heart rates.   HOW TO TAKE YOUR BLOOD PRESSURE: Rest 5 minutes before taking your blood pressure. Don't smoke or drink caffeinated beverages for at least 30 minutes before. Take your blood pressure before (not after) you eat. Sit comfortably with your back supported and both feet on the floor (don't cross your legs). Elevate your arm to heart level on a table or a desk. Use the proper sized cuff. It should fit smoothly and snugly around your bare upper arm. There should be enough room to slip a fingertip under the cuff. The bottom edge of the cuff should be 1 inch above the crease of the elbow. Ideally, take 3 measurements at one sitting and record the average.    Lab Work: NONE If you have labs (blood work) drawn today and your tests are completely normal, you will receive your results only by: [place] (if you have MyChart) OR A paper copy in the mail If you have any lab test that is abnormal or we need to change your treatment, we will call you to review the results.   Testing/Procedures: NONE    Follow-Up: At [facility], you and your health needs are our priority.  As part of our continuing mission to provide you with exceptional heart care, we have created designated Provider Care Teams.  These Care Teams include your primary Cardiologist (physician) and Advanced Practice Providers (APPs -  Physician Assistants and Nurse Practitioners) who all work together to provide you with the care you need, when you need it.  We recommend signing up for the patient portal called "MyChart".  Sign up information is provided on this  After Visit Summary.  MyChart is used to connect with patients for Virtual Visits (Telemedicine).  Patients are able to view lab/test results, encounter notes, upcoming appointments, etc.  Non-urgent messages can be sent to your provider as well.   To learn more about what you can do with MyChart, go to [email or web address].    Your next appointment:   3 month(s)  The format for your next appointment:   In Person  Provider:   Berniece Salines, DO

## 2022-04-26 NOTE — Progress Notes (Signed)
Cardiology Office Note:    Date:  04/26/2022   ID:  Kelsey [name], DOB [birth year], MRN [id number]  PCP:  Marrian Salvage, FNP  Cardiologist:  Berniece Salines, DO  Electrophysiologist:  None   Referring MD: Marrian Salvage,*   " I am having headache"   History of Present Illness:    [name] is a 46 y.o. female with a hx of family history of long QT syndrome with a brother who died acutely after his alarm clock sounded, this was how the family got diagnosed.  The patient tells me she followed with the [facility] for many years and I was able to review this record in 2011 she was discharged from the [facility] long QT clinic with a diagnosis of sinus arrhythmia.  At that time her QTC was 383.  She was taken off beta-blockers as well.   Since her discharge from [facility] in 2011 she has not seen a cardiologist.  She has been following her PCP.  I did see the patient initially on February 25, 2020 at that time she was reporting palpitations as well as Hennis of dizziness.  Today today visit I placed a monitor and the patient and got an echocardiogram to reassess her LV function.  Due to being hypertensive I started the patient on atenolol 25 mg daily.  Blood work was done due to her fatigue for vitamin B12 as well as vitamin D levels.   I saw the patient April 19, 2020 at that time she was experiencing symptomatic PVCs and she was hypertensive I increase her atenolol to 50 mg daily.  Hoping will help with her elevated blood pressure.  I also started patient on vitamin D giving that her vitamin D levels were low normal.   I recommended she undergo a sleep study and also suspected clinical depression playing a role for this.   I last saw the patient in February 2022 at that time her blood pressure had improved and we kept the patient on her atenolol.  We discussed getting a sleep study.  She had gotten a sleep study but needed to have sleep titration.   This is still pending.  I saw the patient on Nov 23, 2021 at that time she was experiencing headaches.  She was hypertensive in the office.  I transition the patient off atenolol and started carvedilol.  12.5 mg daily.  We will continue to valsartan.  She tells me she still is continuing to experience pain.  Her rheumatologist does not believe that this is related to her rheumatoid arthritis.  Past Medical History:  Diagnosis Date   Abnormal Pap smear    Anemia    Bipolar disorder ([place])    Chronic sinusitis    Headache(784.0)    Pyelonephritis 2008   Pylo and stones in the past   Sinus problem    sinusitis    Past Surgical History:  Procedure Laterality Date   CESAREAN SECTION     CESAREAN SECTION  08/31/2011   Procedure: CESAREAN SECTION;  Surgeon: [name], MD;  Location: [facility];  Service: Gynecology;  Laterality: [name];  Repeat cesarean section with delivery of baby    COLPOSCOPY     KNEE ARTHROSCOPY     right knee   WISDOM TOOTH EXTRACTION      Current Medications: Current Meds  Medication Sig   albuterol (VENTOLIN HFA) 108 (90 Base) MCG/ACT inhaler Inhale 2 puffs into the lungs as needed for  wheezing or shortness of breath.   Ascorbic Acid (VITAMIN C PO) Take 1 tablet by mouth daily.   Betamethasone Valerate 0.12 % foam Use daily as directed   carvedilol (COREG) 12.5 MG tablet Take 1 tablet (12.5 mg total) by mouth 2 (two) times daily.   celecoxib (CELEBREX) 200 MG capsule Take 200 mg by mouth as needed for mild pain.   Cholecalciferol (VITAMIN D3 PO) Take 1 tablet by mouth daily.   citalopram (CELEXA) 10 MG tablet Take 1 tablet (10 mg total) by mouth daily.   cyanocobalamin (,VITAMIN B-12,) 1000 MCG/ML injection Inject 1 mL (1,000 mcg total) into the muscle every 30 (thirty) days.   cyclobenzaprine (FLEXERIL) 10 MG tablet    ketoconazole (NIZORAL) 2 % shampoo APPLY 5-10ML TO WET SCALP. LATHER AND LEAVE ON FOR 3-5 MINUTES THEN RINSE. USE TWICE WEEKLY FOR 2-4 WEEKS    levonorgestrel (MIRENA) 20 MCG/24HR IUD 1 each by Intrauterine route once.   Omega-3 Fatty Acids (FISH OIL PO) Take 1 capsule by mouth daily.   Pyridoxine HCl (VITAMIN B-6 PO) Take 1 tablet by mouth daily.   Semaglutide-Weight Management 1 MG/0.5ML SOAJ Inject into the skin.   [DISCONTINUED] valsartan (DIOVAN) 40 MG tablet Take 1 tablet (40 mg total) by mouth daily.     Allergies:   Morphine and related, Sulfa antibiotics, and Oseltamivir   Social History   Socioeconomic History   Marital status: Married    Spouse name: Not on file   Number of children: 2   Years of education: Not on file   Highest education level: Not on file  Occupational History   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  Tobacco Use   Smoking status: Never   Smokeless tobacco: Never  Substance and Sexual Activity   Alcohol use: No   Drug use: No   Sexual activity: Yes  Other Topics Concern   Not on file  Social History Narrative   1 daughter--[name] born 12/09, 1 step son   Social Determinants of Health   Financial Resource Strain: Not on file  Food Insecurity: Not on file  Transportation Needs: Not on file  Physical Activity: Not on file  Stress: Not on file  Social Connections: Not on file     Family History: The patient's family history includes Asthma in her brother; Coronary artery disease in her maternal grandfather; Diabetes in her maternal grandfather; Heart disease in her brother and mother; Heart disease (age of onset: 13) in her maternal grandfather; Kidney disease in her maternal grandfather. There is no history of Cancer, Stroke, or Hypertension.  ROS:   Review of Systems  Constitution: Negative for decreased appetite, fever and weight gain.  HENT: Negative for congestion, ear discharge, hoarse voice and sore throat.   Eyes: Negative for discharge, redness, vision loss in right eye and visual halos.  Cardiovascular: Negative for chest pain, dyspnea on  exertion, leg swelling, orthopnea and palpitations.  Respiratory: Negative for cough, hemoptysis, shortness of breath and snoring.   Endocrine: Negative for heat intolerance and polyphagia.  Hematologic/Lymphatic: Negative for bleeding problem. Does not bruise/bleed easily.  Skin: Negative for flushing, nail changes, rash and suspicious lesions.  Musculoskeletal: Negative for arthritis, joint pain, muscle cramps, myalgias, neck pain and stiffness.  Gastrointestinal: Negative for abdominal pain, bowel incontinence, diarrhea and excessive appetite.  Genitourinary: Negative for decreased libido, genital sores and incomplete emptying.  Neurological: Negative for brief paralysis, focal weakness, headaches and loss of balance.  Psychiatric/Behavioral: Negative  for altered mental status, depression and suicidal ideas.  Allergic/Immunologic: Negative for HIV exposure and persistent infections.    EKGs/Labs/Other Studies Reviewed:    The following studies were reviewed today:   EKG:  The ekg ordered today demonstrates sinus rhythm  Recent Labs: 11/06/2021: ALT 21; BUN 11; Creatinine, Ser 0.70; Hemoglobin 14.0; Platelets 286; Potassium 4.1; Sodium 139; TSH 1.900  Recent Lipid Panel    Component Value Date/Time   CHOL 210 (H) 11/06/2021 0939   TRIG 40 11/06/2021 0939   HDL 69 11/06/2021 0939   CHOLHDL 3.0 11/06/2021 0939   CHOLHDL 3 06/19/2020 1303   VLDL 9.0 06/19/2020 1303   LDLCALC 134 (H) 11/06/2021 0939    Physical Exam:    VS:  BP (!) 144/100   Pulse 82   Ht '5\' 3"'$  (1.6 m)   Wt 156 lb (70.8 kg)   SpO2 99%   BMI 27.63 kg/m     Wt Readings from Last 3 Encounters:  04/26/22 156 lb (70.8 kg)  02/19/22 162 lb 3.2 oz (73.6 kg)  11/23/21 155 lb 6.4 oz (70.5 kg)     GEN: Well nourished, well developed in no acute distress HEENT: Normal NECK: No JVD; No carotid bruits LYMPHATICS: No lymphadenopathy CARDIAC: S1S2 noted,RRR, no murmurs, rubs, gallops RESPIRATORY:  Clear to  auscultation without rales, wheezing or rhonchi  ABDOMEN: Soft, non-tender, non-distended, +bowel sounds, no guarding. EXTREMITIES: No edema, No cyanosis, no clubbing MUSCULOSKELETAL:  No deformity  SKIN: Warm and dry NEUROLOGIC:  Alert and oriented x 3, non-focal PSYCHIATRIC:  Normal affect, good insight  ASSESSMENT:    1. Other chronic pain   2. Essential hypertension   3. Symptomatic PVCs     PLAN:     She is hypertensive in the office today.  Blood pressure also manually taken by me 148/102 millimeters mercury.  Will increase valsartan to 40 mg twice a day.  Continue carvedilol 12.5 mg today.  Discussed that ideally her antihypertensive medication regimen is not optimized-she is not yet on a calcium channel blocker I would like to have her on (amlodipine) and then hydrochlorothiazide before really considering her as resistant hypertension.  She tells me that she has blood pressure that was done with her insurance company she is can upload the fat monitor. She is concerned about getting diagnosed for fibromyalgia given her chronic persistent pain.  She would like to be referred to [facility] and I think it is a good idea therefore I am going to refer the patient.  She will send me blood pressure in 2 weeks via MyChart and we can adjust medication as appropriate.   The patient is in agreement with the above plan. The patient left the office in stable condition.  The patient will follow up in 4 months or sooner if needed.   Medication Adjustments/Labs and Tests Ordered: Current medicines are reviewed at length with the patient today.  Concerns regarding medicines are outlined above.  Orders Placed This Encounter  Procedures   Ambulatory referral to Physical Medicine Rehab   Meds ordered this encounter  Medications   valsartan (DIOVAN) 40 MG tablet    Sig: Take 1 tablet (40 mg total) by mouth 2 (two) times daily.    Dispense:  180 tablet    Refill:  3    Patient Instructions   Medication Instructions:  Your physician has recommended you make the following change in your medication:  INCREASE: Valsartan '40mg'$  Twice Daily.  *If you need a refill on  your cardiac medications before your next appointment, please call your pharmacy*  Please take your blood pressure daily for 2 weeks and send in a MyChart message. Please include heart rates.   HOW TO TAKE YOUR BLOOD PRESSURE: Rest 5 minutes before taking your blood pressure. Don't smoke or drink caffeinated beverages for at least 30 minutes before. Take your blood pressure before (not after) you eat. Sit comfortably with your back supported and both feet on the floor (don't cross your legs). Elevate your arm to heart level on a table or a desk. Use the proper sized cuff. It should fit smoothly and snugly around your bare upper arm. There should be enough room to slip a fingertip under the cuff. The bottom edge of the cuff should be 1 inch above the crease of the elbow. Ideally, take 3 measurements at one sitting and record the average.    Lab Work: NONE If you have labs (blood work) drawn today and your tests are completely normal, you will receive your results only by: [place] (if you have MyChart) OR A paper copy in the mail If you have any lab test that is abnormal or we need to change your treatment, we will call you to review the results.   Testing/Procedures: NONE    Follow-Up: At [facility], you and your health needs are our priority.  As part of our continuing mission to provide you with exceptional heart care, we have created designated Provider Care Teams.  These Care Teams include your primary Cardiologist (physician) and Advanced Practice Providers (APPs -  Physician Assistants and Nurse Practitioners) who all work together to provide you with the care you need, when you need it.  We recommend signing up for the patient portal called "MyChart".  Sign up information is provided on  this After Visit Summary.  MyChart is used to connect with patients for Virtual Visits (Telemedicine).  Patients are able to view lab/test results, encounter notes, upcoming appointments, etc.  Non-urgent messages can be sent to your provider as well.   To learn more about what you can do with MyChart, go to [email or web address].    Your next appointment:   3 month(s)  The format for your next appointment:   In Person  Provider:   Berniece Salines, DO      Adopting a Healthy Lifestyle.  Know what a healthy weight is for you (roughly BMI <25) and aim to maintain this   Aim for 7+ servings of fruits and vegetables daily   65-80+ fluid ounces of water or unsweet tea for healthy kidneys   Limit to max 1 drink of alcohol per day; avoid smoking/tobacco   Limit animal fats in diet for cholesterol and heart health - choose grass fed whenever available   Avoid highly processed foods, and foods high in saturated/trans fats   Aim for low stress - take time to unwind and care for your mental health   Aim for 150 min of moderate intensity exercise weekly for heart health, and weights twice weekly for bone health   Aim for 7-9 hours of sleep daily   When it comes to diets, agreement about the perfect plan isnt easy to find, even among the experts. Experts at the [employer] developed an idea known as the Healthy Eating Plate. Just imagine a plate divided into logical, healthy portions.   The emphasis is on diet quality:   Load up on vegetables and fruits - one-half of  your plate: Aim for color and variety, and remember that potatoes dont count.   Go for whole grains - one-quarter of your plate: Whole wheat, barley, wheat berries, quinoa, oats, brown rice, and foods made with them. If you want pasta, go with whole wheat pasta.   Protein power - one-quarter of your plate: Fish, chicken, beans, and nuts are all healthy, versatile protein sources. Limit red meat.   The  diet, however, does go beyond the plate, offering a few other suggestions.   Use healthy plant oils, such as olive, canola, soy, corn, sunflower and peanut. Check the labels, and avoid partially hydrogenated oil, which have unhealthy trans fats.   If youre thirsty, drink water. Coffee and tea are good in moderation, but skip sugary drinks and limit milk and dairy products to one or two daily servings.   The type of carbohydrate in the diet is more important than the amount. Some sources of carbohydrates, such as vegetables, fruits, whole grains, and beans-are healthier than others.   Finally, stay active  Signed, Berniece Salines, DO  04/26/2022 8:46 PM    [facility]

## 2022-05-15 ENCOUNTER — Encounter: Payer: Self-pay | Admitting: Physical Medicine and Rehabilitation

## 2022-07-26 ENCOUNTER — Encounter: Payer: 59 | Attending: Physical Medicine and Rehabilitation | Admitting: Physical Medicine and Rehabilitation

## 2022-07-26 VITALS — BP 138/90 | HR 94 | Ht 63.0 in | Wt 144.8 lb

## 2022-07-26 DIAGNOSIS — G894 Chronic pain syndrome: Secondary | ICD-10-CM | POA: Diagnosis not present

## 2022-07-26 DIAGNOSIS — Z91018 Allergy to other foods: Secondary | ICD-10-CM | POA: Insufficient documentation

## 2022-07-26 DIAGNOSIS — E559 Vitamin D deficiency, unspecified: Secondary | ICD-10-CM | POA: Diagnosis present

## 2022-07-26 DIAGNOSIS — R5383 Other fatigue: Secondary | ICD-10-CM | POA: Diagnosis not present

## 2022-07-26 MED ORDER — TOPIRAMATE 25 MG PO TABS: 25.0000 mg | ORAL_TABLET | Freq: Every evening | ORAL | 3 refills | Status: DC

## 2022-07-26 MED ORDER — VITAMIN D (ERGOCALCIFEROL) 1.25 MG (50000 UNIT) PO CAPS: 50000.0000 [IU] | ORAL_CAPSULE | ORAL | 0 refills | Status: AC

## 2022-07-26 NOTE — Progress Notes (Signed)
Subjective:    Patient ID: Kelsey [name], female    DOB: [birth year], 47 y.o.   MRN: [id number]  HPI Kelsey [name] is a 47 year old woman who presents to establish care for chronic pain.  1) Chronic pain -tested positive for EBV and some of the banners of Lyme Disease -she was seeing an integrative medicine center -she tested positive for RA -she saw a rheumatologist and had Xrs done -pains started in 2017 and feels like a burning sensation  2) Fatigue -she has been to two different sleep clinics -she went to the Sleep center in [place] and both were negative   Pain Inventory Average Pain 7 Pain Right Now 6 My pain is constant, burning, dull, and aching  In the last 24 hours, has pain interfered with the following? General activity 7 Relation with others 7 Enjoyment of life 8 What TIME of day is your pain at its worst? daytime and evening Sleep (in general) Fair  Pain is worse with:  stress/, tired Pain improves with: heat/ice Relief from Meds: 0  walk without assistance how many minutes can you walk? 20 ability to climb steps?  yes do you drive?  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  weakness loss of taste or smell  New pt  New pt    Family History  Problem Relation Age of Onset   Heart disease Brother        sudden cardiac death   Asthma Brother    Heart disease Maternal Grandfather 5       MI   Coronary artery disease Maternal Grandfather    Diabetes Maternal Grandfather    Kidney disease Maternal Grandfather    Heart disease Mother    Cancer Neg Hx    Stroke Neg Hx    Hypertension Neg Hx    Social History   Socioeconomic History   Marital status: Married    Spouse name: Not on file   Number of children: 2   Years of education: Not on file   Highest education level: Not on file  Occupational History   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  Tobacco Use   Smoking  status: Never   Smokeless tobacco: Never  Substance and Sexual Activity   Alcohol use: No   Drug use: No   Sexual activity: Yes  Other Topics Concern   Not on file  Social History Narrative   1 daughter--[name] born 12/09, 1 step son   Social Determinants of Health   Financial Resource Strain: Not on file  Food Insecurity: Not on file  Transportation Needs: Not on file  Physical Activity: Not on file  Stress: Not on file  Social Connections: Not on file   Past Surgical History:  Procedure Laterality Date   CESAREAN SECTION     CESAREAN SECTION  08/31/2011   Procedure: CESAREAN SECTION;  Surgeon: [name], MD;  Location: [facility];  Service: Gynecology;  Laterality: [name];  Repeat cesarean section with delivery of baby    COLPOSCOPY     KNEE ARTHROSCOPY     right knee   WISDOM TOOTH EXTRACTION     Past Medical History:  Diagnosis Date   Abnormal Pap smear    Anemia    Bipolar disorder ([facility])    Chronic sinusitis    Headache(784.0)    Pyelonephritis 2008   Pylo and stones in the past   Sinus problem  sinusitis   BP (!) 138/90   Pulse 94   Ht '5\' 3"'$  (1.6 m)   Wt 144 lb 12.8 oz (65.7 kg)   SpO2 96%   BMI 25.65 kg/m   Opioid Risk Score:   Fall Risk Score:  `1  Depression screen PHQ 2/9     11/15/2021    4:00 PM 10/19/2020    7:59 AM 06/19/2020   12:32 PM 03/16/2020    2:06 PM 02/02/2020    8:58 AM 02/18/2019    3:32 PM 01/05/2019    9:58 AM  Depression screen PHQ 2/9  Decreased Interest 0 0 0 0 1 0 0  Down, Depressed, Hopeless 0 0 0 0 1 0 0  PHQ - 2 Score 0 0 0 0 2 0 0  Altered sleeping  0 0  0 0 0  Tired, decreased energy  0 0  0 0 0  Change in appetite  0 0  1 0 0  Feeling bad or failure about yourself   0 0  0 0 0  Trouble concentrating  0 0  2 0 0  Moving slowly or fidgety/restless  0 0  0 0 0  Suicidal thoughts  0 0  0 0 0  PHQ-9 Score  0 0  5 0 0  Difficult doing work/chores  Not difficult at all Not difficult at all   Not difficult at all  Not difficult at all     Review of Systems  Gastrointestinal:  Positive for abdominal pain, constipation and diarrhea.  Musculoskeletal:        Joint pain  All other systems reviewed and are negative.     Objective:   Physical Exam Gen: no distress, normal appearing HEENT: oral mucosa pink and moist, NCAT Cardio: Reg rate Chest: normal effort, normal rate of breathing Abd: soft, non-distended Ext: no edema Psych: pleasant, normal affect Skin: intact Neuro: Alert and oriented x3     Assessment & Plan:  1) Chronic Pain Syndrome with diffuse joint pain -Discussed current symptoms of pain and history of pain.  -discussed her history of LONG COVID in 2021 and EBV and Lyme positive markers -discussed her history of being on antibiotics -discussed following with GI -discussed benefit from heat -discussed that valcyclovir seemed to help  -Discussed benefits of exercise in reducing pain. -discussed mechanism of action of low dose naltrexone as an opioid receptor antagonist which stimulates your body's production of its own natural endogenous opioids, helping to decrease pain. Discussed that it can also decrease T cell response and thus be helpful in decreasing inflammation, and symptoms of brain fog, fatigue, anxiety, depression, and allergies. Discussed that this medication needs to be compounded at a compounding pharmacy and can more expensive. Discussed that I usually start at '1mg'$  and if this is not providing enough relief then I titrate upward on a monthly basis.   -Discussed following foods that may reduce pain: 1) Ginger (especially studied for arthritis)- reduce leukotriene production to decrease inflammation 2) Blueberries- high in phytonutrients that decrease inflammation 3) Salmon- marine omega-3s reduce joint swelling and pain 4) Pumpkin seeds- reduce inflammation 5) dark chocolate- reduces inflammation 6) turmeric- reduces inflammation 7) tart cherries - reduce pain and  stiffness 8) extra virgin olive oil - its compound olecanthal helps to block prostaglandins  9) chili peppers- can be eaten or applied topically via capsaicin 10) mint- helpful for headache, muscle aches, joint pain, and itching 11) garlic- reduces inflammation  Link to further information  on diet for chronic pain: [email or web address]   2) Fatigue -discussed her prior sleep studies  3) IBS -discussed her food allergy testing and that she does not often eat salmon, dairy, shrimp, cashews  4) Chronic sinus infections:  11

## 2022-07-26 NOTE — Patient Instructions (Addendum)
Foods that may reduce pain: 1) Ginger (especially studied for arthritis)- reduce leukotriene production to decrease inflammation 2) Blueberries- high in phytonutrients that decrease inflammation 3) Salmon- marine omega-3s reduce joint swelling and pain 4) Pumpkin seeds- reduce inflammation 5) dark chocolate- reduces inflammation 6) turmeric- reduces inflammation 7) tart cherries - reduce pain and stiffness 8) extra virgin olive oil - its compound olecanthal helps to block prostaglandins  9) chili peppers- can be eaten or applied topically via capsaicin 10) mint- helpful for headache, muscle aches, joint pain, and itching 11) garlic- reduces inflammation  Link to further information on diet for chronic pain: [email or web address]   Methylated Vitamin

## 2022-07-27 LAB — VITAMIN D 25 HYDROXY (VIT D DEFICIENCY, FRACTURES): Vit D, 25-Hydroxy: 46.2 ng/mL (ref 30.0–100.0)

## 2022-07-28 LAB — ALLERGENS (22) FOODS IGG
Casein IgG: 11.7 ug/mL — ABNORMAL HIGH (ref 0.0–1.9)
Cheese, Mold Type IgG: 51.9 ug/mL — ABNORMAL HIGH (ref 0.0–1.9)
Chicken IgG: <2 ug/mL (ref 0.0–1.9)
Chili Pepper IgG: 4.4 ug/mL — ABNORMAL HIGH (ref 0.0–1.9)
Chocolate/Cacao IgG: <2 ug/mL (ref 0.0–1.9)
Coffee IgG: <2 ug/mL (ref 0.0–1.9)
Corn IgG: <2 ug/mL (ref 0.0–1.9)
Egg, Whole IgG: 12.6 ug/mL — ABNORMAL HIGH (ref 0.0–1.9)
Green Bean IgG: <2 ug/mL (ref 0.0–1.9)
Haddock IgG: <2 ug/mL (ref 0.0–1.9)
Lamb IgG: <2 ug/mL (ref 0.0–1.9)
Oat IgG: 3.4 ug/mL — ABNORMAL HIGH (ref 0.0–1.9)
Onion IgG: <2 ug/mL (ref 0.0–1.9)
Peanut IgG: <2 ug/mL (ref 0.0–1.9)
Pork IgG: 3.3 ug/mL — ABNORMAL HIGH (ref 0.0–1.9)
Potato, White, IgG: <2 ug/mL (ref 0.0–1.9)
Rye IgG: 2.8 ug/mL — ABNORMAL HIGH (ref 0.0–1.9)
Shrimp IgG: <2 ug/mL (ref 0.0–1.9)
Soybean IgG: <2 ug/mL (ref 0.0–1.9)
Tomato IgG: <2 ug/mL (ref 0.0–1.9)
Wheat IgG: 2.6 ug/mL — ABNORMAL HIGH (ref 0.0–1.9)
Yeast IgG: <2 ug/mL (ref 0.0–1.9)

## 2022-07-29 ENCOUNTER — Encounter (HOSPITAL_BASED_OUTPATIENT_CLINIC_OR_DEPARTMENT_OTHER): Payer: 59 | Admitting: Physical Medicine and Rehabilitation

## 2022-07-29 DIAGNOSIS — E559 Vitamin D deficiency, unspecified: Secondary | ICD-10-CM | POA: Diagnosis not present

## 2022-07-29 DIAGNOSIS — Z91018 Allergy to other foods: Secondary | ICD-10-CM | POA: Diagnosis not present

## 2022-07-30 NOTE — Progress Notes (Signed)
Subjective:    Patient ID: Kelsey [name], female    DOB: 07/18/1975, 47 y.o.   MRN: [id number]  HPI Kelsey [name] is a 47 year old woman who presents for follow-up of chronic pain.   1) Chronic pain -tested positive for EBV and some of the banners of Lyme Disease -she was seeing an integrative medicine center -she tested positive for RA -she saw a rheumatologist and had Xrs done -pains started in 2017 and feels like a burning sensation -has not yet heard from [name] regarding her LDN -willing to take additional vitamin D supplement  2) Fatigue -she has been to two different sleep clinics -she went to the Sleep center in [name] and both were negative   Pain Inventory Average Pain 7 Pain Right Now 6 My pain is constant, burning, dull, and aching  In the last 24 hours, has pain interfered with the following? General activity 7 Relation with others 7 Enjoyment of life 8 What TIME of day is your pain at its worst? daytime and evening Sleep (in general) Fair  Pain is worse with:  stress/, tired Pain improves with: heat/ice Relief from Meds: 0  walk without assistance how many minutes can you walk? 20 ability to climb steps?  yes do you drive?  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  weakness loss of taste or smell  New pt  New pt    Family History  Problem Relation Age of Onset   Heart disease Brother        sudden cardiac death   Asthma Brother    Heart disease Maternal Grandfather 40       MI   Coronary artery disease Maternal Grandfather    Diabetes Maternal Grandfather    Kidney disease Maternal Grandfather    Heart disease Mother    Cancer Neg Hx    Stroke Neg Hx    Hypertension Neg Hx    Social History   Socioeconomic History   Marital status: Married    Spouse name: Not on file   Number of children: 2   Years of education: Not on file   Highest education level: Not on file  Occupational  History   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  Tobacco Use   Smoking status: Never   Smokeless tobacco: Never  Substance and Sexual Activity   Alcohol use: No   Drug use: No   Sexual activity: Yes  Other Topics Concern   Not on file  Social History Narrative   1 daughter--Kelsey [name] born 12/09, 1 step son   Social Determinants of Health   Financial Resource Strain: Not on file  Food Insecurity: Not on file  Transportation Needs: Not on file  Physical Activity: Not on file  Stress: Not on file  Social Connections: Not on file   Past Surgical History:  Procedure Laterality Date   CESAREAN SECTION     CESAREAN SECTION  08/31/2011   Procedure: CESAREAN SECTION;  Surgeon: [name], MD;  Location: [facility];  Service: Gynecology;  Laterality: [name];  Repeat cesarean section with delivery of baby    COLPOSCOPY     KNEE ARTHROSCOPY     right knee   WISDOM TOOTH EXTRACTION     Past Medical History:  Diagnosis Date   Abnormal Pap smear    Anemia    Bipolar disorder (HCC)    Chronic sinusitis    Headache(784.0)  Pyelonephritis 2008   Pylo and stones in the past   Sinus problem    sinusitis   There were no vitals taken for this visit.  Opioid Risk Score:   Fall Risk Score:  `1  Depression screen [facility] 2/9     07/26/2022   10:54 AM 11/15/2021    4:00 PM 10/19/2020    7:59 AM 06/19/2020   12:32 PM 03/16/2020    2:06 PM 02/02/2020    8:58 AM 02/18/2019    3:32 PM  Depression screen PHQ 2/9  Decreased Interest 0 0 0 0 0 1 0  Down, Depressed, Hopeless 0 0 0 0 0 1 0  PHQ - 2 Score 0 0 0 0 0 2 0  Altered sleeping 1  0 0  0 0  Tired, decreased energy 1  0 0  0 0  Change in appetite 1  0 0  1 0  Feeling bad or failure about yourself  0  0 0  0 0  Trouble concentrating 1  0 0  2 0  Moving slowly or fidgety/restless 0  0 0  0 0  Suicidal thoughts 0  0 0  0 0  PHQ-9 Score 4  0 0  5 0  Difficult doing work/chores Somewhat difficult  Not difficult  at all Not difficult at all   Not difficult at all     Review of Systems  Gastrointestinal:  Positive for abdominal pain, constipation and diarrhea.  Musculoskeletal:        Joint pain  All other systems reviewed and are negative.      Objective:   Physical Exam Not performed     Assessment & Plan:  1) Chronic Pain Syndrome with diffuse joint pain -Discussed current symptoms of pain and history of pain.  -discussed her history of LONG COVID in 2021 and EBV and Lyme positive markers -discussed her history of being on antibiotics -discussed following with GI -discussed benefit from heat -called [name] to check on LDN script -prescribed vitamin D -discussed that valcyclovir seemed to help  -Discussed benefits of exercise in reducing pain. -discussed mechanism of action of low dose naltrexone as an opioid receptor antagonist which stimulates your body's production of its own natural endogenous opioids, helping to decrease pain. Discussed that it can also decrease T cell response and thus be helpful in decreasing inflammation, and symptoms of brain fog, fatigue, anxiety, depression, and allergies. Discussed that this medication needs to be compounded at a compounding pharmacy and can more expensive. Discussed that I usually start at '1mg'$  and if this is not providing enough relief then I titrate upward on a monthly basis.   -Discussed following foods that may reduce pain: 1) Ginger (especially studied for arthritis)- reduce leukotriene production to decrease inflammation 2) Blueberries- high in phytonutrients that decrease inflammation 3) Salmon- marine omega-3s reduce joint swelling and pain 4) Pumpkin seeds- reduce inflammation 5) dark chocolate- reduces inflammation 6) turmeric- reduces inflammation 7) tart cherries - reduce pain and stiffness 8) extra virgin olive oil - its compound olecanthal helps to block prostaglandins  9) chili peppers- can be eaten or applied  topically via capsaicin 10) mint- helpful for headache, muscle aches, joint pain, and itching 11) garlic- reduces inflammation  Link to further information on diet for chronic pain: [email or web address]   2) Fatigue -discussed her prior sleep studies  3) IBS -discussed her food allergy testing and that she does not often eat salmon, dairy, shrimp, cashews -discussed  that new food allergy testing shows reactivity to pork, cheese, wheat, casein  4) Chronic sinus infections  5) Suboptimal vitamin D: -sent Vitamin D ergocalciferol 50,000U once per week for 7 weeks.   10 minutes spent in discussion of her food allergy testing and vitamin D level, recommended ergocalciferol 50,000U once per week for 7 weeks, elimination diet of gluten/dairy/pork for 4 weeks, discussed that she has not yet heard from [facility]

## 2022-08-02 ENCOUNTER — Ambulatory Visit: Payer: 59 | Admitting: Cardiology

## 2022-09-27 ENCOUNTER — Encounter: Payer: 59 | Attending: Physical Medicine and Rehabilitation | Admitting: Physical Medicine and Rehabilitation

## 2022-09-27 ENCOUNTER — Encounter: Payer: Self-pay | Admitting: Physical Medicine and Rehabilitation

## 2022-09-27 ENCOUNTER — Telehealth: Payer: Self-pay | Admitting: Physical Medicine and Rehabilitation

## 2022-09-27 ENCOUNTER — Other Ambulatory Visit: Payer: Self-pay | Admitting: Physical Medicine and Rehabilitation

## 2022-09-27 VITALS — BP 115/78 | HR 72 | Ht 63.0 in | Wt 137.6 lb

## 2022-09-27 DIAGNOSIS — F411 Generalized anxiety disorder: Secondary | ICD-10-CM | POA: Diagnosis not present

## 2022-09-27 DIAGNOSIS — G894 Chronic pain syndrome: Secondary | ICD-10-CM

## 2022-09-27 DIAGNOSIS — K589 Irritable bowel syndrome without diarrhea: Secondary | ICD-10-CM

## 2022-09-27 MED ORDER — MODAFINIL 100 MG PO TABS: 100.0000 mg | ORAL_TABLET | Freq: Every day | ORAL | 3 refills | Status: DC

## 2022-09-27 NOTE — Telephone Encounter (Signed)
Patient called states rx was not sent to correct pharmacy , patient needs rx modafinil 100mg  sent to [facility] [address] in [place] , patient is unable to transfer it and due to cost its cost less at [facility]

## 2022-09-27 NOTE — Patient Instructions (Addendum)
Foods that may reduce pain: 1) Ginger (especially studied for arthritis)- reduce leukotriene production to decrease inflammation 2) Blueberries- high in phytonutrients that decrease inflammation 3) Salmon- marine omega-3s reduce joint swelling and pain 4) Pumpkin seeds- reduce inflammation 5) dark chocolate- reduces inflammation 6) turmeric- reduces inflammation 7) tart cherries - reduce pain and stiffness 8) extra virgin olive oil - its compound olecanthal helps to block prostaglandins  9) chili peppers- can be eaten or applied topically via capsaicin 10) mint- helpful for headache, muscle aches, joint pain, and itching 11) garlic- reduces inflammation  Link to further information on diet for chronic pain: [email or web address]   Anxiety: -Discussed exercise and meditation as tools to decrease anxiety. -Recommended Down Dog Yoga app -Discussed spending time outdoors. -Discussed positive re-framing of anxiety.  -Discussed the following foods that have been show to reduce anxiety: 1) [place] nuts, mushrooms, soy beans due to their high selenium content. Upper limit of toxicity of selenium is 420mcg/day so no more than 3-4 [place] nuts per day.  2) Fatty fish such as salmon, mackerel, sardines, trout, and herring- high in omega-3 fatty acids 3) Eggs- increases serotonin and dopamine 4) Pumpkin seeds- high in omega-3 fatty acids 5) dark chocolate- high in flavanols that increase blood flow to brain 6) turmeric- take with black pepper to increase absorption 7) chamomile tea- antioxidant and anti-inflammatory properties 8) yogurt without sugar- supports gut-brain axis 9) green tea- contains L- theanine 10) blueberries- high in vitamin C and antioxidants 11) [place]- high in tryptophan which gets converted to serotonin 12) bell peppers- rich in vitamin C and antioxidants 13) citrus fruits- rich in vitamin C and  antioxidants 14) almonds- high in vitamin E and healthy fats 15) chia seeds- high in omega-3 fatty acids  [facility]

## 2022-09-27 NOTE — Progress Notes (Signed)
Subjective:    Patient ID: Kelsey [name], female    DOB: 1975-11-30, 47 y.o.   MRN: [id number]  HPI Kelsey [name] is a 47 year old woman who presents for follow-up of chronic pain, anxiety.  1) Chronic pain -pain has been better with naltrexone -tested positive for EBV and some of the banners of Lyme Disease -she was seeing an integrative medicine center -she tested positive for RA -she saw a rheumatologist and had Xrs done -pains started in 2017 and feels like a burning sensation -has not yet heard from [facility] regarding her LDN -willing to take additional vitamin D supplement  2) Fatigue -she has been to two different sleep clinics -she went to the Sleep center in [place] and both were negative  3) Anxiety: -has been having stressors after her divorce with her ex-husband trying to gain more custody of her daughter. -she has seen a psychiatrist to deal with this. -she switched her Celexa to Cymbalta -when she had a break from her husband and was able to get off the medication and feel fine -started seeing a new psychiatrist in [place] and suggested trying the Cymbalta -she has been working on [place] therapy and grounding    Pain Inventory Average Pain 6 Pain Right Now 5 My pain is constant, burning, dull, and aching  In the last 24 hours, has pain interfered with the following? General activity 6 Relation with others 5 Enjoyment of life 7 What TIME of day is your pain at its worst? daytime and evening Sleep (in general) Fair  Pain is worse with:  stress/, tired Pain improves with: heat/ice Relief from Meds: 0      Family History  Problem Relation Age of Onset   Heart disease Brother        sudden cardiac death   Asthma Brother    Heart disease Maternal Grandfather 1       MI   Coronary artery disease Maternal Grandfather    Diabetes Maternal Grandfather    Kidney disease Maternal Grandfather    Heart disease Mother     Cancer Neg Hx    Stroke Neg Hx    Hypertension Neg Hx    Social History   Socioeconomic History   Marital status: Married    Spouse name: Not on file   Number of children: 2   Years of education: Not on file   Highest education level: Not on file  Occupational History   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  Tobacco Use   Smoking status: Never   Smokeless tobacco: Never  Substance and Sexual Activity   Alcohol use: No   Drug use: No   Sexual activity: Yes  Other Topics Concern   Not on file  Social History Narrative   1 daughter--Kelsey [name] born 12/09, 1 step son   Social Determinants of Health   Financial Resource Strain: Not on file  Food Insecurity: Not on file  Transportation Needs: Not on file  Physical Activity: Not on file  Stress: Not on file  Social Connections: Not on file   Past Surgical History:  Procedure Laterality Date   [place]  08/31/2011   Procedure: CESAREAN SECTION;  Surgeon: [name], MD;  Location: [facility];  Service: Gynecology;  Laterality: [name];  Repeat cesarean section with delivery of baby    COLPOSCOPY     KNEE ARTHROSCOPY     right knee  WISDOM TOOTH EXTRACTION     Past Medical History:  Diagnosis Date   Abnormal Pap smear    Anemia    Bipolar disorder ([place])    Chronic sinusitis    Headache(784.0)    Pyelonephritis 2008   Pylo and stones in the past   Sinus problem    sinusitis   BP 115/78   Pulse 72   Ht 5\' 3"  (1.6 m)   Wt 137 lb 9.6 oz (62.4 kg)   SpO2 98%   BMI 24.37 kg/m   Opioid Risk Score:   Fall Risk Score:  `1  Depression screen [facility] 2/9     09/27/2022   10:11 AM 07/26/2022   10:54 AM 11/15/2021    4:00 PM 10/19/2020    7:59 AM 06/19/2020   12:32 PM 03/16/2020    2:06 PM 02/02/2020    8:58 AM  Depression screen PHQ 2/9  Decreased Interest 0 0 0 0 0 0 1  Down, Depressed, Hopeless 0 0 0 0 0 0 1  PHQ - 2 Score 0 0 0 0 0 0 2  Altered sleeping  1  0 0   0  Tired, decreased energy  1  0 0  0  Change in appetite  1  0 0  1  Feeling bad or failure about yourself   0  0 0  0  Trouble concentrating  1  0 0  2  Moving slowly or fidgety/restless  0  0 0  0  Suicidal thoughts  0  0 0  0  PHQ-9 Score  4  0 0  5  Difficult doing work/chores  Somewhat difficult  Not difficult at all Not difficult at all       Review of Systems  Constitutional: Negative.   HENT: Negative.    Eyes: Negative.   Respiratory: Negative.    Cardiovascular: Negative.   Gastrointestinal:  Positive for abdominal pain, constipation and diarrhea.  Endocrine: Negative.   Genitourinary: Negative.   Musculoskeletal:        Joint pain  Skin: Negative.   Allergic/Immunologic: Negative.   Neurological: Negative.   Hematological: Negative.   Psychiatric/Behavioral: Negative.    All other systems reviewed and are negative.      Objective:   Physical Exam Not performed     Assessment & Plan:  1) Chronic Pain Syndrome with diffuse joint pain -Discussed current symptoms of pain and history of pain.  -discussed her history of LONG COVID in 2021 and EBV and Lyme positive markers -discussed her history of being on antibiotics -discussed following with GI -discussed benefit from heat -called [facility] to check on LDN script -prescribed vitamin D -discussed that valcyclovir seemed to help  -Discussed benefits of exercise in reducing pain. -discussed mechanism of action of low dose naltrexone as an opioid receptor antagonist which stimulates your body's production of its own natural endogenous opioids, helping to decrease pain. Discussed that it can also decrease T cell response and thus be helpful in decreasing inflammation, and symptoms of brain fog, fatigue, anxiety, depression, and allergies. Discussed that this medication needs to be compounded at a compounding pharmacy and can more expensive. Discussed that I usually start at 1mg  and if this is not  providing enough relief then I titrate upward on a monthly basis.    -Discussed following foods that may reduce pain: 1) Ginger (especially studied for arthritis)- reduce leukotriene production to decrease inflammation 2) Blueberries- high in phytonutrients that decrease  inflammation 3) Salmon- marine omega-3s reduce joint swelling and pain 4) Pumpkin seeds- reduce inflammation 5) dark chocolate- reduces inflammation 6) turmeric- reduces inflammation 7) tart cherries - reduce pain and stiffness 8) extra virgin olive oil - its compound olecanthal helps to block prostaglandins  9) chili peppers- can be eaten or applied topically via capsaicin 10) mint- helpful for headache, muscle aches, joint pain, and itching 11) garlic- reduces inflammation  Link to further information on diet for chronic pain: [email or web address]   2) Fatigue -discussed her prior sleep studies  3) IBS -discussed her food allergy testing and that she does not often eat salmon, dairy, shrimp, cashews -discussed that new food allergy testing shows reactivity to pork, cheese, wheat, casein -discussed that she has cut out gluten  4) Chronic sinus infections  5) Suboptimal vitamin D: -sent Vitamin D ergocalciferol 50,000U once per week for 7 weeks.   6)Anxiety: -Discussed exercise and meditation as tools to decrease anxiety. -Recommended Down Dog Yoga app -Discussed spending time outdoors. -Discussed positive re-framing of anxiety.  -Discussed the following foods that have been show to reduce anxiety: 1) [place] nuts, mushrooms, soy beans due to their high selenium content. Upper limit of toxicity of selenium is 438mcg/day so no more than 3-4 [place] nuts per day.  2) Fatty fish such as salmon, mackerel, sardines, trout, and herring- high in omega-3 fatty acids 3) Eggs- increases serotonin and dopamine 4) Pumpkin seeds- high in omega-3 fatty  acids 5) dark chocolate- high in flavanols that increase blood flow to brain 6) turmeric- take with black pepper to increase absorption 7) chamomile tea- antioxidant and anti-inflammatory properties 8) yogurt without sugar- supports gut-brain axis 9) green tea- contains L- theanine 10) blueberries- high in vitamin C and antioxidants 11) [place]- high in tryptophan which gets converted to serotonin 12) bell peppers- rich in vitamin C and antioxidants 13) citrus fruits- rich in vitamin C and antioxidants 14) almonds- high in vitamin E and healthy fats 15) chia seeds- high in omega-3 fatty acids   7) Mouth ulcers -continue acyclovir and consider taking daily

## 2022-09-27 NOTE — Telephone Encounter (Signed)
Patient notified

## 2022-09-27 NOTE — Addendum Note (Signed)
Addended by: Izora Ribas on: 09/27/2022 10:36 AM   Modules accepted: Orders

## 2022-11-08 ENCOUNTER — Encounter: Payer: Self-pay | Admitting: Physical Medicine and Rehabilitation

## 2022-11-08 ENCOUNTER — Encounter: Payer: 59 | Attending: Physical Medicine and Rehabilitation | Admitting: Physical Medicine and Rehabilitation

## 2022-11-08 VITALS — BP 97/69 | HR 83 | Ht 63.0 in | Wt 133.8 lb

## 2022-11-08 DIAGNOSIS — F411 Generalized anxiety disorder: Secondary | ICD-10-CM | POA: Diagnosis not present

## 2022-11-08 DIAGNOSIS — K589 Irritable bowel syndrome without diarrhea: Secondary | ICD-10-CM | POA: Diagnosis not present

## 2022-11-08 DIAGNOSIS — R5383 Other fatigue: Secondary | ICD-10-CM | POA: Insufficient documentation

## 2022-11-08 DIAGNOSIS — G894 Chronic pain syndrome: Secondary | ICD-10-CM | POA: Diagnosis not present

## 2022-11-08 MED ORDER — MODAFINIL 200 MG PO TABS: 200.0000 mg | ORAL_TABLET | Freq: Every day | ORAL | 3 refills | Status: DC

## 2022-11-08 NOTE — Progress Notes (Signed)
Subjective:    Patient ID: Kelsey [name], female    DOB: 14-Apr-1976, 47 y.o.   MRN: [id number]  HPI Kelsey [name] is a 47 year old woman who presents for f/u of chronic pain, anxiety.  1) Chronic pain -pain has improved -she can tell when she does not take the magnesium and potassium -pain has been better with naltrexone and cymbalta -tested positive for EBV and some of the banners of Lyme Disease -she was seeing an integrative medicine center -she tested positive for RA -she saw a rheumatologist and had Xrs done -pains started in 2017 and feels like a burning sensation -has not yet heard from [facility] regarding her LDN -willing to take additional vitamin D supplement  2) Fatigue -she has been to two different sleep clinics -she went to the Sleep center in [place] and both were negative -she asks about going up to 200mg  daily -sleep has not worsened since starting modafinil  3) Anxiety: -has been having stressors after her divorce with her ex-husband trying to gain more custody of her daughter. -she has seen a psychiatrist to deal with this. -she switched her Celexa to Cymbalta -when she had a break from her husband and was able to get off the medication and feel fine -started seeing a new psychiatrist in [place] and suggested trying the Cymbalta -she has been working on [place] therapy and grounding  -has been seeing a psychiatrist Dr. Jacob Moores who has been talking about how viral load affects mitochondira   Pain Inventory Average Pain 3 Pain Right Now 3 My pain is intermittent, dull, and aching  In the last 24 hours, has pain interfered with the following? General activity 2 Relation with others 2 Enjoyment of life 3 What TIME of day is your pain at its worst? evening Sleep (in general) Fair  Pain is worse with:  stress/, tired Pain improves with: heat/ice Relief from Meds: 0      Family History  Problem Relation Age of Onset    Heart disease Brother        sudden cardiac death   Asthma Brother    Heart disease Maternal Grandfather 61       MI   Coronary artery disease Maternal Grandfather    Diabetes Maternal Grandfather    Kidney disease Maternal Grandfather    Heart disease Mother    Cancer Neg Hx    Stroke Neg Hx    Hypertension Neg Hx    Social History   Socioeconomic History   Marital status: Married    Spouse name: Not on file   Number of children: 2   Years of education: Not on file   Highest education level: Not on file  Occupational History   Occupation: clinical Hospital doctor: SELF-EMPLOYED  Tobacco Use   Smoking status: Never   Smokeless tobacco: Never  Substance and Sexual Activity   Alcohol use: No   Drug use: No   Sexual activity: Yes  Other Topics Concern   Not on file  Social History Narrative   1 daughter--[name] born 12/09, 1 step son   Social Determinants of Health   Financial Resource Strain: Not on file  Food Insecurity: Not on file  Transportation Needs: Not on file  Physical Activity: Not on file  Stress: Not on file  Social Connections: Not on file   Past Surgical History:  Procedure Laterality Date   CESAREAN SECTION     CESAREAN SECTION  08/31/2011   Procedure: CESAREAN SECTION;  Surgeon: Tresa Endo A. Ernestina Penna, MD;  Location: [facility];  Service: Gynecology;  Laterality: [name];  Repeat cesarean section with delivery of baby    COLPOSCOPY     KNEE ARTHROSCOPY     right knee   WISDOM TOOTH EXTRACTION     Past Medical History:  Diagnosis Date   Abnormal Pap smear    Anemia    Bipolar disorder (HCC)    Chronic sinusitis    Headache(784.0)    Pyelonephritis 2008   Pylo and stones in the past   Sinus problem    sinusitis   There were no vitals taken for this visit.  Opioid Risk Score:   Fall Risk Score:  `1  Depression screen [facility] 2/9     09/27/2022   10:11 AM 07/26/2022   10:54 AM 11/15/2021    4:00 PM 10/19/2020    7:59 AM 06/19/2020    12:32 PM 03/16/2020    2:06 PM 02/02/2020    8:58 AM  Depression screen PHQ 2/9  Decreased Interest 0 0 0 0 0 0 1  Down, Depressed, Hopeless 0 0 0 0 0 0 1  PHQ - 2 Score 0 0 0 0 0 0 2  Altered sleeping  1  0 0  0  Tired, decreased energy  1  0 0  0  Change in appetite  1  0 0  1  Feeling bad or failure about yourself   0  0 0  0  Trouble concentrating  1  0 0  2  Moving slowly or fidgety/restless  0  0 0  0  Suicidal thoughts  0  0 0  0  PHQ-9 Score  4  0 0  5  Difficult doing work/chores  Somewhat difficult  Not difficult at all Not difficult at all       Review of Systems  Constitutional: Negative.   HENT: Negative.    Eyes: Negative.   Respiratory: Negative.    Cardiovascular: Negative.   Gastrointestinal:  Positive for abdominal pain, constipation and diarrhea.  Endocrine: Negative.   Genitourinary: Negative.   Musculoskeletal:        Joint pain  Skin: Negative.   Allergic/Immunologic: Negative.   Neurological: Negative.   Hematological: Negative.   Psychiatric/Behavioral: Negative.    All other systems reviewed and are negative.      Objective:   Physical Exam Not performed     Assessment & Plan:  1) Chronic Pain Syndrome with diffuse joint pain -Discussed current symptoms of pain and history of pain.  -discussed her history of LONG COVID in 2021 and EBV and Lyme positive markers -discussed her history of being on antibiotics -discussed following with GI -discussed the Book [employer] -discussed getting a CBC with differentials, checking iron panel -discussed benefit from heat -called [facility] pharmacy to check on LDN script -prescribed vitamin D -discussed that she smells gasoline all the time.  -discussed methylene blue -discussed red light therapy -discussed that valcyclovir seemed to help  -Discussed benefits of exercise in reducing pain. -discussed mechanism of action of low dose naltrexone as an opioid receptor antagonist which stimulates your  body's production of its own natural endogenous opioids, helping to decrease pain. Discussed that it can also decrease T cell response and thus be helpful in decreasing inflammation, and symptoms of brain fog, fatigue, anxiety, depression, and allergies. Discussed that this medication needs to be compounded at a compounding pharmacy and can more  expensive. Discussed that I usually start at 1mg  and if this is not providing enough relief then I titrate upward on a monthly basis.    -Discussed following foods that may reduce pain: 1) Ginger (especially studied for arthritis)- reduce leukotriene production to decrease inflammation 2) Blueberries- high in phytonutrients that decrease inflammation 3) Salmon- marine omega-3s reduce joint swelling and pain 4) Pumpkin seeds- reduce inflammation 5) dark chocolate- reduces inflammation 6) turmeric- reduces inflammation 7) tart cherries - reduce pain and stiffness 8) extra virgin olive oil - its compound olecanthal helps to block prostaglandins  9) chili peppers- can be eaten or applied topically via capsaicin 10) mint- helpful for headache, muscle aches, joint pain, and itching 11) garlic- reduces inflammation  Link to further information on diet for chronic pain: [email or web address]   2) Fatigue -discussed her prior sleep studies -discussed red light therapy -continue modafinil -recommended sauna use  3) IBS -discussed her food allergy testing and that she does not often eat salmon, dairy, shrimp, cashews -discussed that new food allergy testing shows reactivity to pork, cheese, wheat, casein -discussed that she has cut out gluten -discussed that this feels better -discussed that she feels the probiotic is helpful  4) Chronic sinus infections  5) Suboptimal vitamin D: -sent Vitamin D ergocalciferol 50,000U once per week for 7 weeks.   6)Anxiety: -Discussed exercise and  meditation as tools to decrease anxiety. -Recommended Down Dog Yoga app -Discussed spending time outdoors. -Discussed positive re-framing of anxiety.  -Discussed the following foods that have been show to reduce anxiety: 1) [place] nuts, mushrooms, soy beans due to their high selenium content. Upper limit of toxicity of selenium is 43mcg/day so no more than 3-4 [place] nuts per day.  2) Fatty fish such as salmon, mackerel, sardines, trout, and herring- high in omega-3 fatty acids 3) Eggs- increases serotonin and dopamine 4) Pumpkin seeds- high in omega-3 fatty acids 5) dark chocolate- high in flavanols that increase blood flow to brain 6) turmeric- take with black pepper to increase absorption 7) chamomile tea- antioxidant and anti-inflammatory properties 8) yogurt without sugar- supports gut-brain axis 9) green tea- contains L- theanine 10) blueberries- high in vitamin C and antioxidants 11) [place]- high in tryptophan which gets converted to serotonin 12) bell peppers- rich in vitamin C and antioxidants 13) citrus fruits- rich in vitamin C and antioxidants 14) almonds- high in vitamin E and healthy fats 15) chia seeds- high in omega-3 fatty acids   7) Mouth ulcers -continue acyclovir and consider taking daily -she asks if this is an indication of viral load.

## 2022-11-08 NOTE — Patient Instructions (Addendum)
Joovv Red light therapy Natto  Nattokinase Urolithin A Mitopur Coenzyme Q10 Theodoro Grist Asprey

## 2022-11-09 LAB — CBC WITH DIFFERENTIAL/PLATELET
Basophils Absolute: 0.1 10*3/uL (ref 0.0–0.2)
Basos: 1 %
EOS (ABSOLUTE): 0.3 10*3/uL (ref 0.0–0.4)
Eos: 5 %
Hematocrit: 36.9 % (ref 34.0–46.6)
Hemoglobin: 12.4 g/dL (ref 11.1–15.9)
Immature Grans (Abs): 0 10*3/uL (ref 0.0–0.1)
Immature Granulocytes: 0 %
Lymphocytes Absolute: 1.9 10*3/uL (ref 0.7–3.1)
Lymphs: 31 %
MCH: 31.2 pg (ref 26.6–33.0)
MCHC: 33.6 g/dL (ref 31.5–35.7)
MCV: 93 fL (ref 79–97)
Monocytes Absolute: 0.5 10*3/uL (ref 0.1–0.9)
Monocytes: 8 %
Neutrophils Absolute: 3.5 10*3/uL (ref 1.4–7.0)
Neutrophils: 55 %
Platelets: 303 10*3/uL (ref 150–450)
RBC: 3.97 x10E6/uL (ref 3.77–5.28)
RDW: 12.6 % (ref 11.7–15.4)
WBC: 6.3 10*3/uL (ref 3.4–10.8)

## 2022-11-09 LAB — IRON,TIBC AND FERRITIN PANEL
Ferritin: 146 ng/mL (ref 15–150)
Iron Saturation: 23 % (ref 15–55)
Iron: 58 ug/dL (ref 27–159)
Total Iron Binding Capacity: 256 ug/dL (ref 250–450)
UIBC: 198 ug/dL (ref 131–425)

## 2022-11-27 ENCOUNTER — Telehealth: Payer: Self-pay | Admitting: *Deleted

## 2022-11-27 ENCOUNTER — Ambulatory Visit (INDEPENDENT_AMBULATORY_CARE_PROVIDER_SITE_OTHER): Payer: 59 | Admitting: Internal Medicine

## 2022-11-27 ENCOUNTER — Ambulatory Visit: Payer: 59 | Admitting: Family Medicine

## 2022-11-27 VITALS — BP 123/82 | HR 122 | Temp 98.6°F | Resp 16 | Wt 134.0 lb

## 2022-11-27 DIAGNOSIS — R109 Unspecified abdominal pain: Secondary | ICD-10-CM

## 2022-11-27 DIAGNOSIS — J22 Unspecified acute lower respiratory infection: Secondary | ICD-10-CM | POA: Insufficient documentation

## 2022-11-27 LAB — POC URINALSYSI DIPSTICK (AUTOMATED)
Bilirubin, UA: NEGATIVE
Glucose, UA: NEGATIVE
Ketones, UA: NEGATIVE
Leukocytes, UA: NEGATIVE
Nitrite, UA: NEGATIVE
Protein, UA: NEGATIVE
Spec Grav, UA: <=1.005 — AB (ref 1.010–1.025)
Urobilinogen, UA: 0.2 E.U./dL
pH, UA: 6 (ref 5.0–8.0)

## 2022-11-27 LAB — POC COVID19 BINAXNOW: SARS Coronavirus 2 Ag: NEGATIVE

## 2022-11-27 LAB — POC INFLUENZA A&B (BINAX/QUICKVUE)
Negative: NEGATIVE
Negative: NEGATIVE

## 2022-11-27 MED ORDER — FLUCONAZOLE 150 MG PO TABS: ORAL_TABLET | ORAL | 0 refills | Status: DC

## 2022-11-27 MED ORDER — AMOXICILLIN-POT CLAVULANATE 875-125 MG PO TABS: 1.0000 | ORAL_TABLET | Freq: Two times a day (BID) | ORAL | 0 refills | Status: DC

## 2022-11-27 NOTE — Progress Notes (Signed)
Established Patient Office Visit  Subjective   Patient ID: Kelsey [name], female    DOB: 02/20/1976  Age: 47 y.o. MRN: [id number]  Chief Complaint  Patient presents with   Cough    Patient complains of productive cough   Fever    Reports having a fever up to 101.5   Generalized Body Aches    Complains of body aches / headaches    Sore Throat    Complains of sore throat    Patient complains of cough with productive green-yellow sputum, scratchy throat, nausea, body aches, headache, decreased appetite onset approximately 5 days ago. Also reports associated bilateral flank pain, has PMhx of pyelonephritis and kidney stones. Also reports associated mild SHOB, relieved with her ventolin inhaler.  Has been taking tylenol and ibuprofen for fever.  Patient's family members recently had similar symptoms. Patient's daughters and husband recently dx with PNA.  Denies vomiting, CP.        ROS See HPI   Objective:     BP 123/82 (BP Location: Right Arm, Patient Position: Sitting, Cuff Size: Small)   Pulse (!) 122   Temp 98.6 F (37 C) (Oral)   Resp 16   Wt 134 lb (60.8 kg)   SpO2 99%   BMI 23.74 kg/m    Physical Exam Constitutional:      Appearance: Normal appearance.  HENT:     Head: Normocephalic.     Right Ear: Tympanic membrane and ear canal normal.     Left Ear: Tympanic membrane and ear canal normal.     Nose: No congestion.     Right Turbinates: Not swollen.     Mouth/Throat:     Lips: Pink.     Mouth: Mucous membranes are moist.     Tonsils: No tonsillar exudate. 0 on the right. 0 on the left.  Cardiovascular:     Rate and Rhythm: Regular rhythm. Tachycardia present.     Pulses: Normal pulses.     Heart sounds: Normal heart sounds.  Pulmonary:     Effort: Pulmonary effort is normal.     Breath sounds: Examination of the left-lower field reveals decreased breath sounds. Decreased breath sounds present. No wheezing, rhonchi or rales.   Lymphadenopathy:     Cervical: No cervical adenopathy.  Skin:    General: Skin is warm and dry.  Neurological:     Mental Status: She is alert and oriented to person, place, and time.  Psychiatric:        Mood and Affect: Mood normal.      Results for orders placed or performed in visit on 11/27/22  POCT Urinalysis Dipstick (Automated)  Result Value Ref Range   Color, UA yellow    Clarity, UA clear    Glucose, UA Negative Negative   Bilirubin, UA negative    Ketones, UA negative    Spec Grav, UA <=1.005 (A) 1.010 - 1.025   Blood, UA small    pH, UA 6.0 5.0 - 8.0   Protein, UA Negative Negative   Urobilinogen, UA 0.2 0.2 or 1.0 E.U./dL   Nitrite, UA negative    Leukocytes, UA Negative Negative  POC COVID-19  Result Value Ref Range   SARS Coronavirus 2 Ag Negative Negative  POC Influenza A&B (Binax test)  Result Value Ref Range   Negative negative    Negative negative       The 10-year ASCVD risk score (Arnett DK, et al., 2019) is: 0.8%    Assessment &  Plan:   Problem List Items Addressed This Visit       Respiratory   Acute lower respiratory infection - Primary    Take augmentin as prescribed  Alternate tylenol/ibuprofen as needed for fever/bodyaches  Rest  Drink plenty of fluids, stay hydrated      Relevant Medications   amoxicillin-clavulanate (AUGMENTIN) 875-125 MG tablet   fluconazole (DIFLUCAN) 150 MG tablet   Other Relevant Orders   POC COVID-19 (Completed)   POC Influenza A&B (Binax test) (Completed)     Other   LOW BACK PAIN SYNDROME    Return if symptoms worsen or fail to improve.    Salvatore Decent, FNP

## 2022-11-27 NOTE — Telephone Encounter (Signed)
Urine drug screen for this encounter is consistent for prescribed medication 

## 2022-11-27 NOTE — Assessment & Plan Note (Addendum)
Take augmentin as prescribed  Alternate tylenol/ibuprofen as needed for fever/bodyaches  Rest  Drink plenty of fluids, stay hydrated

## 2022-11-27 NOTE — Patient Instructions (Signed)
Drink plenty of fluids  Continue alternating tylenol and ibuprofen for fever and body aches

## 2022-11-28 ENCOUNTER — Ambulatory Visit: Payer: 59 | Admitting: Family

## 2022-12-20 ENCOUNTER — Encounter: Payer: 59 | Attending: Physical Medicine and Rehabilitation | Admitting: Physical Medicine and Rehabilitation

## 2022-12-20 VITALS — BP 124/88 | HR 102 | Ht 63.0 in | Wt 136.6 lb

## 2022-12-20 DIAGNOSIS — J349 Unspecified disorder of nose and nasal sinuses: Secondary | ICD-10-CM | POA: Insufficient documentation

## 2022-12-20 DIAGNOSIS — K121 Other forms of stomatitis: Secondary | ICD-10-CM | POA: Diagnosis present

## 2022-12-20 DIAGNOSIS — G894 Chronic pain syndrome: Secondary | ICD-10-CM | POA: Diagnosis present

## 2022-12-20 DIAGNOSIS — J189 Pneumonia, unspecified organism: Secondary | ICD-10-CM | POA: Diagnosis present

## 2022-12-20 MED ORDER — VALACYCLOVIR HCL 500 MG PO TABS: 500.0000 mg | ORAL_TABLET | Freq: Every day | ORAL | 3 refills | Status: DC

## 2022-12-20 NOTE — Patient Instructions (Signed)
Red light therapy 

## 2022-12-20 NOTE — Progress Notes (Addendum)
Subjective:    Patient ID: Kelsey [name], female    DOB: December 22, 1975, 47 y.o.   MRN: [id number]  HPI Kelsey [name] is a 47 year old woman who presents for f/u of chronic pain, anxiety.  1) Chronic pain -pain has improved -she can tell when she does not take the magnesium and potassium -pain has been better with naltrexone and cymbalta -tested positive for EBV and some of the banners of Lyme Disease -she was seeing an integrative medicine center -she tested positive for RA -she saw a rheumatologist and had Xrs done -pains started in 2017 and feels like a burning sensation -has not yet heard from [facility] regarding her LDN -willing to take additional vitamin D supplement -being off the cymbalta and LDN she had more pain  2) Fatigue -she has been to two different sleep clinics -she went to the Sleep center in [place] and both were negative -she asks about going up to 200mg  daily -sleep has not worsened since starting modafinil  3) Anxiety: -has been having stressors after her divorce with her ex-husband trying to gain more custody of her daughter. -she has seen a psychiatrist to deal with this. -she switched her Celexa to Cymbalta -when she had a break from her husband and was able to get off the medication and feel fine -started seeing a new psychiatrist in [place] and suggested trying the Cymbalta -she has been working on [place] therapy and grounding  -has been seeing a psychiatrist Dr. Jacob Moores who has been talking about how viral load affects mitochondira  4) Pneumonia: -had pneumonia since I last saw her -she had to received 2 antibiotics, steroids, and inhalers -she had to miss 4 days of work. -she was in so much pain from the pneumonia that it was hard to tell if the antibiotics helped  Pain Inventory Average Pain 3 Pain Right Now 5 My pain is intermittent, dull, and aching  In the last 24 hours, has pain interfered with the  following? General activity 3 Relation with others 3 Enjoyment of life 5 What TIME of day is your pain at its worst? morning , evening, and night Sleep (in general) Fair  Pain is worse with: sitting and inactivity Pain improves with: heat/ice and medication Relief from Meds: 7      Family History  Problem Relation Age of Onset   Heart disease Brother        sudden cardiac death   Asthma Brother    Heart disease Maternal Grandfather 33       MI   Coronary artery disease Maternal Grandfather    Diabetes Maternal Grandfather    Kidney disease Maternal Grandfather    Heart disease Mother    Cancer Neg Hx    Stroke Neg Hx    Hypertension Neg Hx    Social History   Socioeconomic History   Marital status: Married    Spouse name: Not on file   Number of children: 2   Years of education: Not on file   Highest education level: Master's degree (e.g., MA, MS, MEng, MEd, MSW, MBA)  Occupational History   Occupation: clinical Hospital doctor: SELF-EMPLOYED  Tobacco Use   Smoking status: Never   Smokeless tobacco: Never  Substance and Sexual Activity   Alcohol use: No   Drug use: No   Sexual activity: Yes  Other Topics Concern   Not on file  Social History Narrative   1 daughter--[name]  born 12/09, 1 step son   Social Determinants of Health   Financial Resource Strain: Low Risk  (11/27/2022)   Overall Financial Resource Strain (CARDIA)    Difficulty of Paying Living Expenses: Not very hard  Food Insecurity: No Food Insecurity (11/27/2022)   Hunger Vital Sign    Worried About Running Out of Food in the Last Year: Never true    Ran Out of Food in the Last Year: Never true  Transportation Needs: No Transportation Needs (11/27/2022)   PRAPARE - Administrator, Civil Service (Medical): No    Lack of Transportation (Non-Medical): No  Physical Activity: Insufficiently Active (11/27/2022)   Exercise Vital Sign    Days of Exercise per Week: 3 days     Minutes of Exercise per Session: 30 min  Stress: No Stress Concern Present (11/27/2022)   [employer] of Occupational Health - Occupational Stress Questionnaire    Feeling of Stress : Only a little  Social Connections: Socially Integrated (11/27/2022)   Social Connection and Isolation Panel [NHANES]    Frequency of Communication with Friends and Family: More than three times a week    Frequency of Social Gatherings with Friends and Family: More than three times a week    Attends Religious Services: More than 4 times per year    Active Member of Clubs or Organizations: Yes    Attends Banker Meetings: More than 4 times per year    Marital Status: Married   Past Surgical History:  Procedure Laterality Date   CESAREAN SECTION     CESAREAN SECTION  08/31/2011   Procedure: CESAREAN SECTION;  Surgeon: Tresa Endo A. Ernestina Penna, MD;  Location: [facility];  Service: Gynecology;  Laterality: [name];  Repeat cesarean section with delivery of baby    COLPOSCOPY     KNEE ARTHROSCOPY     right knee   WISDOM TOOTH EXTRACTION     Past Medical History:  Diagnosis Date   Abnormal Pap smear    Anemia    Bipolar disorder (HCC)    Chronic sinusitis    Headache(784.0)    Pyelonephritis 2008   Pylo and stones in the past   Sinus problem    sinusitis   BP 124/88   Pulse (!) 102   Ht 5\' 3"  (1.6 m)   Wt 136 lb 9.6 oz (62 kg)   SpO2 99%   BMI 24.20 kg/m   Opioid Risk Score:   Fall Risk Score:  `1  Depression screen [facility] 2/9     11/08/2022   10:46 AM 09/27/2022   10:11 AM 07/26/2022   10:54 AM 11/15/2021    4:00 PM 10/19/2020    7:59 AM 06/19/2020   12:32 PM 03/16/2020    2:06 PM  Depression screen PHQ 2/9  Decreased Interest 0 0 0 0 0 0 0  Down, Depressed, Hopeless 0 0 0 0 0 0 0  PHQ - 2 Score 0 0 0 0 0 0 0  Altered sleeping   1  0 0   Tired, decreased energy   1  0 0   Change in appetite   1  0 0   Feeling bad or failure about yourself    0  0 0   Trouble concentrating   1  0 0    Moving slowly or fidgety/restless   0  0 0   Suicidal thoughts   0  0 0   PHQ-9 Score   4  0  0   Difficult doing work/chores   Somewhat difficult  Not difficult at all Not difficult at all      Review of Systems  Constitutional: Negative.   HENT: Negative.    Eyes: Negative.   Respiratory: Negative.    Cardiovascular: Negative.   Gastrointestinal:  Positive for abdominal pain, constipation and diarrhea.  Endocrine: Negative.   Genitourinary: Negative.   Musculoskeletal:        Joint pain  Skin: Negative.   Allergic/Immunologic: Negative.   Neurological: Negative.   Hematological: Negative.   Psychiatric/Behavioral: Negative.    All other systems reviewed and are negative.      Objective:   Physical Exam Gen: no distress, normal appearing HEENT: oral mucosa pink and moist, NCAT Cardio: Reg rate Chest: normal effort, normal rate of breathing Abd: soft, non-distended Ext: no edema Psych: pleasant, normal affect, anxiety improved Skin: intact Neuro: Alert and oriented x3     Assessment & Plan:  1) Chronic Pain Syndrome with diffuse joint pain -Discussed current symptoms of pain and history of pain.  -discussed her history of LONG COVID in 2021 and EBV and Lyme positive markers -continue cymbalta -encouraged turmeric -recommended the Coconut Cult -discussed her history of being on antibiotics -discussed following with GI -discussed the Book FibroFix -discussed getting a CBC with differentials, checking iron panel -discussed benefit from heat -called [facility] pharmacy to check on LDN script, continue current dose -prescribed vitamin D -discussed that she smells gasoline all the time.  -discussed methylene blue -discussed red light therapy -discussed that valcyclovir seemed to help  -Discussed benefits of exercise in reducing pain. -discussed mechanism of action of low dose naltrexone as an opioid receptor antagonist which stimulates your body's production of  its own natural endogenous opioids, helping to decrease pain. Discussed that it can also decrease T cell response and thus be helpful in decreasing inflammation, and symptoms of brain fog, fatigue, anxiety, depression, and allergies. Discussed that this medication needs to be compounded at a compounding pharmacy and can more expensive. Discussed that I usually start at 1mg  and if this is not providing enough relief then I titrate upward on a monthly basis.    -Discussed following foods that may reduce pain: 1) Ginger (especially studied for arthritis)- reduce leukotriene production to decrease inflammation 2) Blueberries- high in phytonutrients that decrease inflammation 3) Salmon- marine omega-3s reduce joint swelling and pain 4) Pumpkin seeds- reduce inflammation 5) dark chocolate- reduces inflammation 6) turmeric- reduces inflammation 7) tart cherries - reduce pain and stiffness 8) extra virgin olive oil - its compound olecanthal helps to block prostaglandins  9) chili peppers- can be eaten or applied topically via capsaicin 10) mint- helpful for headache, muscle aches, joint pain, and itching 11) garlic- reduces inflammation  Link to further information on diet for chronic pain: [email or web address]   2) Fatigue -discussed her prior sleep studies -discussed red light therapy -continue modafinil -recommended sauna use  3) IBS -discussed her food allergy testing and that she does not often eat salmon, dairy, shrimp, cashews -discussed that new food allergy testing shows reactivity to pork, cheese, wheat, casein -discussed that she has cut out gluten -discussed that this feels better -discussed that she feels the probiotic is helpful  4) Chronic sinus infections -discussed that valcyclovir  -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   6)Anxiety: -continue cymbalta -Discussed exercise and meditation  as tools to decrease anxiety. -Recommended Down Dog Yoga app -Discussed spending time outdoors. -Discussed positive re-framing of anxiety.  -Discussed the following foods that have been show to reduce anxiety: 1) [place] nuts, mushrooms, soy beans due to their high selenium content. Upper limit of toxicity of selenium is 444mcg/day so no more than 3-4 [place] nuts per day.  2) Fatty fish such as salmon, mackerel, sardines, trout, and herring- high in omega-3 fatty acids 3) Eggs- increases serotonin and dopamine 4) Pumpkin seeds- high in omega-3 fatty acids 5) dark chocolate- high in flavanols that increase blood flow to brain 6) turmeric- take with black pepper to increase absorption 7) chamomile tea- antioxidant and anti-inflammatory properties 8) yogurt without sugar- supports gut-brain axis 9) green tea- contains L- theanine 10) blueberries- high in vitamin C and antioxidants 11) [place]- high in tryptophan which gets converted to serotonin 12) bell peppers- rich in vitamin C and antioxidants 13) citrus fruits- rich in vitamin C and antioxidants 14) almonds- high in vitamin E and healthy fats 15) chia seeds- high in omega-3 fatty acids  7) Mouth ulcers -continue acyclovir and consider taking daily -she asks if this is an indication of viral load.  -discussed that she will get a mouth outbreak every other week if she does not take the valcyclovir -she wonders if she can take the valcyclovir daily for longer periods of time.  -discussed taking valtrex daily for 2 weeks  8) Insomnia: -recommended red light therapy -discussed epsom salts.  -can try handful of pistachios before meal -continue tart cherry juice  >40 minutes spent in discussion of insomnia, mouth ulcers, fibromyalgia, chronic sinus infections, joint pain

## 2022-12-21 LAB — COMPREHENSIVE METABOLIC PANEL
ALT: 17 IU/L (ref 0–32)
AST: 19 IU/L (ref 0–40)
Albumin: 4.2 g/dL (ref 3.9–4.9)
Alkaline Phosphatase: 71 IU/L (ref 44–121)
BUN/Creatinine Ratio: 13 (ref 9–23)
BUN: 9 mg/dL (ref 6–24)
Bilirubin Total: 0.3 mg/dL (ref 0.0–1.2)
CO2: 26 mmol/L (ref 20–29)
Calcium: 9.1 mg/dL (ref 8.7–10.2)
Chloride: 102 mmol/L (ref 96–106)
Creatinine, Ser: 0.68 mg/dL (ref 0.57–1.00)
Globulin, Total: 2.5 g/dL (ref 1.5–4.5)
Glucose: 77 mg/dL (ref 70–99)
Potassium: 4.4 mmol/L (ref 3.5–5.2)
Sodium: 140 mmol/L (ref 134–144)
Total Protein: 6.7 g/dL (ref 6.0–8.5)
eGFR: 109 mL/min/{1.73_m2} (ref >59)

## 2023-02-25 NOTE — Progress Notes (Signed)
Subjective:    Patient ID: Kelsey [name], female    DOB: [birth year], 47 y.o.   MRN: [id number]  HPI Kelsey [name] is a 47 year old woman who presents for f/u of chronic pain, anxiety.  1) Chronic pain -pain has increased, would be interested in increased low dose naltrexone -taking magnesium and tart cherry juice -she can tell when she does not take the magnesium and potassium -pain has been better with naltrexone and cymbalta -tested positive for EBV and some of the banners of Lyme Disease -she was seeing an integrative medicine center -she tested positive for RA -she saw a rheumatologist and had Xrs done -pains started in 2017 and feels like a burning sensation -has not yet heard from [facility] regarding her LDN -willing to take additional vitamin D supplement  2) Fatigue -she has been to two different sleep clinics -she went to the Sleep center in [place] and both were negative -she asks about going up to 200mg  daily -sleep has not worsened since starting modafinil  3) Anxiety: -has been having stressors after her divorce with her ex-husband trying to gain more custody of her daughter. -she has seen a psychiatrist to deal with this. -she switched her Celexa to Cymbalta -when she had a break from her husband and was able to get off the medication and feel fine -started seeing a new psychiatrist in [place] and suggested trying the Cymbalta -she has been working on [place] therapy and grounding  -has been seeing a psychiatrist Kelsey [name] who has been talking about how viral load affects mitochondira  4) Insomnia: -magnesium glycinate helps    Pain Inventory Average Pain 5 Pain Right Now 5 My pain is intermittent, dull, and aching  In the last 24 hours, has pain interfered with the following? General activity 5 Relation with others 3 Enjoyment of life 3 What TIME of day is your pain at its worst? daytime and evening Sleep (in general)  Fair  Pain is worse with: bending, sitting, and stress/, tired Pain improves with: heat/ice Relief from Meds: 0      Family History  Problem Relation Age of Onset   Heart disease Brother        sudden cardiac death   Asthma Brother    Heart disease Maternal Grandfather 26       MI   Coronary artery disease Maternal Grandfather    Diabetes Maternal Grandfather    Kidney disease Maternal Grandfather    Heart disease Mother    Cancer Neg Hx    Stroke Neg Hx    Hypertension Neg Hx    Social History   Socioeconomic History   Marital status: Married    Spouse name: Not on file   Number of children: 2   Years of education: Not on file   Highest education level: Master's degree (e.g., MA, MS, MEng, MEd, MSW, MBA)  Occupational History   Occupation: clinical Hospital doctor: SELF-EMPLOYED  Tobacco Use   Smoking status: Never   Smokeless tobacco: Never  Substance and Sexual Activity   Alcohol use: No   Drug use: No   Sexual activity: Yes  Other Topics Concern   Not on file  Social History Narrative   1 daughter--Kelsey [name] born 12/09, 1 step son   Social Determinants of Health   Financial Resource Strain: Low Risk  (11/27/2022)   Overall Financial Resource Strain (CARDIA)    Difficulty of Paying Living Expenses: Not  very hard  Food Insecurity: No Food Insecurity (11/27/2022)   Hunger Vital Sign    Worried About Running Out of Food in the Last Year: Never true    Ran Out of Food in the Last Year: Never true  Transportation Needs: No Transportation Needs (11/27/2022)   PRAPARE - Administrator, Civil Service (Medical): No    Lack of Transportation (Non-Medical): No  Physical Activity: Insufficiently Active (11/27/2022)   Exercise Vital Sign    Days of Exercise per Week: 3 days    Minutes of Exercise per Session: 30 min  Stress: No Stress Concern Present (11/27/2022)   [employer] of Occupational Health - Occupational Stress Questionnaire     Feeling of Stress : Only a little  Social Connections: Socially Integrated (11/27/2022)   Social Connection and Isolation Panel [NHANES]    Frequency of Communication with Friends and Family: More than three times a week    Frequency of Social Gatherings with Friends and Family: More than three times a week    Attends Religious Services: More than 4 times per year    Active Member of Clubs or Organizations: Yes    Attends Banker Meetings: More than 4 times per year    Marital Status: Married   Past Surgical History:  Procedure Laterality Date   CESAREAN SECTION     CESAREAN SECTION  08/31/2011   Procedure: CESAREAN SECTION;  Surgeon: Tresa Endo A. Ernestina Penna, MD;  Location: [facility];  Service: Gynecology;  Laterality: [name];  Repeat cesarean section with delivery of baby    COLPOSCOPY     KNEE ARTHROSCOPY     right knee   WISDOM TOOTH EXTRACTION     Past Medical History:  Diagnosis Date   Abnormal Pap smear    Anemia    Bipolar disorder (HCC)    Chronic sinusitis    Headache(784.0)    Pyelonephritis 2008   Pylo and stones in the past   Sinus problem    sinusitis   There were no vitals taken for this visit.  Opioid Risk Score:   Fall Risk Score:  `1  Depression screen [facility] 2/9     11/08/2022   10:46 AM 09/27/2022   10:11 AM 07/26/2022   10:54 AM 11/15/2021    4:00 PM 10/19/2020    7:59 AM 06/19/2020   12:32 PM 03/16/2020    2:06 PM  Depression screen PHQ 2/9  Decreased Interest 0 0 0 0 0 0 0  Down, Depressed, Hopeless 0 0 0 0 0 0 0  PHQ - 2 Score 0 0 0 0 0 0 0  Altered sleeping   1  0 0   Tired, decreased energy   1  0 0   Change in appetite   1  0 0   Feeling bad or failure about yourself    0  0 0   Trouble concentrating   1  0 0   Moving slowly or fidgety/restless   0  0 0   Suicidal thoughts   0  0 0   PHQ-9 Score   4  0 0   Difficult doing work/chores   Somewhat difficult  Not difficult at all Not difficult at all      Review of Systems  Constitutional:  Negative.   HENT: Negative.    Eyes: Negative.   Respiratory: Negative.    Cardiovascular: Negative.   Gastrointestinal:  Positive for abdominal pain, constipation and diarrhea.  Endocrine:  Negative.   Genitourinary: Negative.   Musculoskeletal:        Joint pain  Skin: Negative.   Allergic/Immunologic: Negative.   Neurological: Negative.   Hematological: Negative.   Psychiatric/Behavioral: Negative.    All other systems reviewed and are negative.      Objective:   Physical Exam Not performed     Assessment & Plan:  1) Chronic Pain Syndrome with diffuse joint pain -Discussed current symptoms of pain and history of pain.  -discussed her history of LONG COVID in 2021 and EBV and Lyme positive markers -discussed her history of being on antibiotics -discussed following with GI -discussed the Book FibroFix -discussed getting a CBC with differentials, checking iron panel -discussed benefit from heat -called [facility] pharmacy to check on LDN script, will increased dose to 2mg  HS -prescribed vitamin D -discussed that she smells gasoline all the time.  -discussed methylene blue -discussed red light therapy -discussed that valcyclovir seemed to help, change back to prn in case she is developing tolerance -Discussed benefits of exercise in reducing pain. -discussed mechanism of action of low dose naltrexone as an opioid receptor antagonist which stimulates your body's production of its own natural endogenous opioids, helping to decrease pain. Discussed that it can also decrease T cell response and thus be helpful in decreasing inflammation, and symptoms of brain fog, fatigue, anxiety, depression, and allergies. Discussed that this medication needs to be compounded at a compounding pharmacy and can more expensive. Discussed that I usually start at 1mg  and if this is not providing enough relief then I titrate upward on a monthly basis.    -Discussed following foods that may reduce  pain: 1) Ginger (especially studied for arthritis)- reduce leukotriene production to decrease inflammation 2) Blueberries- high in phytonutrients that decrease inflammation 3) Salmon- marine omega-3s reduce joint swelling and pain 4) Pumpkin seeds- reduce inflammation 5) dark chocolate- reduces inflammation 6) turmeric- reduces inflammation 7) tart cherries - reduce pain and stiffness 8) extra virgin olive oil - its compound olecanthal helps to block prostaglandins  9) chili peppers- can be eaten or applied topically via capsaicin 10) mint- helpful for headache, muscle aches, joint pain, and itching 11) garlic- reduces inflammation  Link to further information on diet for chronic pain: [email or web address].[email or web address]   2) Fatigue -discussed her prior sleep studies -discussed red light therapy -continue modafinil -recommended sauna use  3) IBS -discussed her food allergy testing and that she does not often eat salmon, dairy, shrimp, cashews -discussed that new food allergy testing shows reactivity to pork, cheese, wheat, casein -discussed that she has cut out gluten -discussed that this feels better -discussed that she feels the probiotic is helpful -recommended pendulum GLP-1  4) Chronic sinus infections  5) Suboptimal vitamin D: -sent Vitamin D ergocalciferol 50,000U once per week for 7 weeks.   6)Anxiety: -Discussed exercise and meditation as tools to decrease anxiety. -Recommended Down Dog Yoga app -Discussed spending time outdoors. -Discussed positive re-framing of anxiety.  -Discussed the following foods that have been show to reduce anxiety: 1) [place] nuts, mushrooms, soy beans due to their high selenium content. Upper limit of toxicity of selenium is 454mcg/day so no more than 3-4 [place] nuts per day.  2) Fatty fish such as salmon, mackerel, sardines, trout, and herring- high in omega-3 fatty acids 3) Eggs-  increases serotonin and dopamine 4) Pumpkin seeds- high in omega-3 fatty acids 5) dark chocolate- high in flavanols that increase blood flow to brain 6) turmeric- take with black pepper  to increase absorption 7) chamomile tea- antioxidant and anti-inflammatory properties 8) yogurt without sugar- supports gut-brain axis 9) green tea- contains L- theanine 10) blueberries- high in vitamin C and antioxidants 11) [place]- high in tryptophan which gets converted to serotonin 12) bell peppers- rich in vitamin C and antioxidants 13) citrus fruits- rich in vitamin C and antioxidants 14) almonds- high in vitamin E and healthy fats 15) chia seeds- high in omega-3 fatty acids   7) Mouth ulcers -continue acyclovir, change back to prn -she asks if this is an indication of viral load.  -trial lysine 500mg  to 3,000mg , discussed that herpes virus chokes on lysine  -Provided with a pain relief journal and discussed that it contains foods and lifestyle tips to naturally help to improve pain. Discussed that these lifestyle strategies are also very good for health unlike some medications which can have negative side effects. Discussed that the act of keeping a journal can be therapeutic and helpful to realize patterns what helps to trigger and alleviate pain.    8) Insomnia:  -continue magnesium supplement and tart cherry juice.

## 2023-02-26 ENCOUNTER — Other Ambulatory Visit: Payer: Self-pay | Admitting: Family

## 2023-02-28 ENCOUNTER — Encounter: Payer: Self-pay | Admitting: Physical Medicine and Rehabilitation

## 2023-02-28 ENCOUNTER — Encounter: Payer: 59 | Attending: Physical Medicine and Rehabilitation | Admitting: Physical Medicine and Rehabilitation

## 2023-02-28 VITALS — BP 128/84 | HR 80 | Ht 63.0 in | Wt 139.8 lb

## 2023-02-28 DIAGNOSIS — G4700 Insomnia, unspecified: Secondary | ICD-10-CM | POA: Insufficient documentation

## 2023-02-28 DIAGNOSIS — K589 Irritable bowel syndrome without diarrhea: Secondary | ICD-10-CM | POA: Insufficient documentation

## 2023-02-28 DIAGNOSIS — G894 Chronic pain syndrome: Secondary | ICD-10-CM | POA: Diagnosis not present

## 2023-02-28 DIAGNOSIS — K121 Other forms of stomatitis: Secondary | ICD-10-CM | POA: Diagnosis present

## 2023-03-04 ENCOUNTER — Encounter: Payer: Self-pay | Admitting: Physical Medicine and Rehabilitation

## 2023-03-14 ENCOUNTER — Telehealth: Payer: Self-pay | Admitting: Family

## 2023-03-14 NOTE — Telephone Encounter (Signed)
Pt scheduled np appt with Kelsey [name] via [employer]. I spoke with pt, she is wanting to transfer her care from [facility] to [facility]. She had a visit with Kelsey [name] in 5 of 2024 and really liked her. Let me know if there is any concern with this.

## 2023-03-17 NOTE — Telephone Encounter (Signed)
Okay with me 

## 2023-04-09 ENCOUNTER — Encounter: Payer: 59 | Admitting: Internal Medicine

## 2023-04-30 ENCOUNTER — Ambulatory Visit (INDEPENDENT_AMBULATORY_CARE_PROVIDER_SITE_OTHER): Payer: 59 | Admitting: Family Medicine

## 2023-04-30 ENCOUNTER — Encounter: Payer: Self-pay | Admitting: Family Medicine

## 2023-04-30 VITALS — BP 120/90 | HR 101 | Ht 63.0 in | Wt 139.0 lb

## 2023-04-30 DIAGNOSIS — E538 Deficiency of other specified B group vitamins: Secondary | ICD-10-CM | POA: Insufficient documentation

## 2023-04-30 DIAGNOSIS — L299 Pruritus, unspecified: Secondary | ICD-10-CM | POA: Diagnosis not present

## 2023-04-30 DIAGNOSIS — Z1211 Encounter for screening for malignant neoplasm of colon: Secondary | ICD-10-CM

## 2023-04-30 DIAGNOSIS — M2550 Pain in unspecified joint: Secondary | ICD-10-CM | POA: Insufficient documentation

## 2023-04-30 DIAGNOSIS — R232 Flushing: Secondary | ICD-10-CM | POA: Insufficient documentation

## 2023-04-30 DIAGNOSIS — Z1231 Encounter for screening mammogram for malignant neoplasm of breast: Secondary | ICD-10-CM

## 2023-04-30 MED ORDER — CYANOCOBALAMIN 1000 MCG/ML IJ SOLN
1000.0000 ug | INTRAMUSCULAR | 5 refills | Status: AC
Start: 2023-04-30 — End: ?

## 2023-04-30 NOTE — Assessment & Plan Note (Signed)
With patient's fatigue we will go ahead and try going back on b12 injection. She has been off for some time now and I am wondering if adding this back will help mitigate some of her fatigue. I like that she sees physical medicine and is on naltrexone, modafinil, and cymbalta.

## 2023-04-30 NOTE — Assessment & Plan Note (Signed)
Pt notes pruritus in the setting of no rash. Makes me wonder if we are dealing with a liver/biliary system issue. Will go ahead and order a CMP to further investigate.

## 2023-04-30 NOTE — Assessment & Plan Note (Signed)
Pt admits to hot flashes so we will go ahead and order FSH, estradiol, and testosterone levels to evaluate for menopause. She has an IUD so we cannot go off of amenorrhea

## 2023-04-30 NOTE — Progress Notes (Signed)
Established patient visit   Patient: Kelsey [name]   DOB: [birth year]   47 y.o. Female  MRN: [id number] Visit Date: 04/30/2023  Today's healthcare provider: Charlton Amor, DO   Chief Complaint  Patient presents with   Establish Care    SUBJECTIVE    Chief Complaint  Patient presents with   Establish Care   HPI  Pt presents to establish care.  Pmh includes - sees physical medicine for severe fatigue and is on modafinil, naltrexone, and cymbalta  - she feels like it has been working   Does have a hx of positive ana and positive RF but was seen by rheumatologist and was told that her RF wasn't high enough for treatment. She presents today with swollen lymph nodes and joint pain of her shoulders, hips, thighs, etc.    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 She also has a hx of prolonged QT syndrome and had a brother pass away from this.   Has a pmh of b12 deficiency but has been off her injections.   She is concerned that she is having hot flashes. Currently has IUD.   Needs cologuard screening and mammogram.   Review of Systems  Constitutional:  Negative for activity change, fatigue and fever.  Respiratory:  Negative for cough and shortness of breath.   Cardiovascular:  Negative for chest pain.  Gastrointestinal:  Negative for abdominal pain.  Genitourinary:  Negative for difficulty urinating.       No outpatient medications have been marked as taking for the 04/30/23 encounter (Office Visit) with Charlton Amor, DO.    OBJECTIVE    BP (!) 120/90   Pulse (!) 101   Ht 5\' 3"  (1.6 m)   Wt 139 lb (63 kg)   SpO2 97%   BMI 24.62 kg/m   Physical Exam Vitals and nursing note reviewed.  Constitutional:      General: She is not in acute distress.    Appearance: Normal appearance.  HENT:     Head: Normocephalic and atraumatic.     Right Ear: External ear normal.     Left Ear: External ear normal.      Nose: Nose normal.  Eyes:     Conjunctiva/sclera: Conjunctivae normal.  Cardiovascular:     Rate and Rhythm: Normal rate and regular rhythm.  Pulmonary:     Effort: Pulmonary effort is normal.     Breath sounds: Normal breath sounds.  Neurological:     General: No focal deficit present.     Mental Status: She is alert and oriented to person, place, and time.  Psychiatric:        Mood and Affect: Mood normal.        Behavior: Behavior normal.        Thought Content: Thought content normal.        Judgment: Judgment normal.        ASSESSMENT & PLAN    Problem List Items Addressed This Visit       Cardiovascular and Mediastinum   Hot flashes    Pt admits to hot flashes so we will go ahead and order FSH, estradiol, and testosterone levels to evaluate for menopause. She has an IUD so we cannot go off of amenorrhea      Relevant Orders   FSH   Estradiol   Testosterone     Musculoskeletal and Integument   Pruritus    Pt notes  pruritus in the setting of no rash. Makes me wonder if we are dealing with a liver/biliary system issue. Will go ahead and order a CMP to further investigate.       Relevant Orders   CMP14+EGFR     Other   Arthralgia - Primary    Pt has a hx of arthralgias and with swollen lymph nodes today I would like to go ahead and repeat ana panel to see if her ana is elevated to a level to get treatment with RA. She has polyarthralgia including the shoulder and hip girdle which makes me wonder if we are dealing with a PMR picture. Will go ahead and order CRP and ESR levels to evaluate for inflammation levels.       Relevant Orders   Sed Rate (ESR)   C-reactive protein   ANA,IFA RA Diag Pnl w/rflx Tit/Patn   B12 deficiency    With patient's fatigue we will go ahead and try going back on b12 injection. She has been off for some time now and I am wondering if adding this back will help mitigate some of her fatigue. I like that she sees physical medicine and is  on naltrexone, modafinil, and cymbalta.       Relevant Medications   cyanocobalamin (VITAMIN B12) 1000 MCG/ML injection   Other Visit Diagnoses     Encounter for screening mammogram for malignant neoplasm of breast       Relevant Orders   MM DIGITAL SCREENING BILATERAL   Screening for colon cancer       Relevant Orders   Cologuard       No follow-ups on file.      Meds ordered this encounter  Medications   cyanocobalamin (VITAMIN B12) 1000 MCG/ML injection    Sig: Inject 1 mL (1,000 mcg total) into the muscle every 30 (thirty) days.    Dispense:  10 mL    Refill:  5    Orders Placed This Encounter  Procedures   MM DIGITAL SCREENING BILATERAL    Standing Status:   Future    Standing Expiration Date:   04/29/2024    Order Specific Question:   Is the patient pregnant?    Answer:   No    Order Specific Question:   Preferred imaging location?    Answer:   [facility]    Order Specific Question:   Reason for exam:    Answer:   screening for breast cancer    Order Specific Question:   Release to patient    Answer:   Immediate   Sed Rate (ESR)   C-reactive protein   FSH   Estradiol   Testosterone   CMP14+EGFR   ANA,IFA RA Diag Pnl w/rflx Tit/Patn   Cologuard     Charlton Amor, DO  [facility] at [facility] [phone] (phone) [phone] (fax)  [facility]

## 2023-04-30 NOTE — Assessment & Plan Note (Signed)
Pt has a hx of arthralgias and with swollen lymph nodes today I would like to go ahead and repeat ana panel to see if her ana is elevated to a level to get treatment with RA. She has polyarthralgia including the shoulder and hip girdle which makes me wonder if we are dealing with a PMR picture. Will go ahead and order CRP and ESR levels to evaluate for inflammation levels.

## 2023-05-02 LAB — CMP14+EGFR
ALT: 13 [IU]/L (ref 0–32)
AST: 15 [IU]/L (ref 0–40)
Albumin: 4.2 g/dL (ref 3.9–4.9)
Alkaline Phosphatase: 54 [IU]/L (ref 44–121)
BUN/Creatinine Ratio: 9 (ref 9–23)
BUN: 7 mg/dL (ref 6–24)
Bilirubin Total: 0.4 mg/dL (ref 0.0–1.2)
CO2: 26 mmol/L (ref 20–29)
Calcium: 9.1 mg/dL (ref 8.7–10.2)
Chloride: 99 mmol/L (ref 96–106)
Creatinine, Ser: 0.76 mg/dL (ref 0.57–1.00)
Globulin, Total: 2.2 g/dL (ref 1.5–4.5)
Glucose: 79 mg/dL (ref 70–99)
Potassium: 4.2 mmol/L (ref 3.5–5.2)
Sodium: 138 mmol/L (ref 134–144)
Total Protein: 6.4 g/dL (ref 6.0–8.5)
eGFR: 98 mL/min/{1.73_m2} (ref >59)

## 2023-05-02 LAB — C-REACTIVE PROTEIN: CRP: <1 mg/L (ref 0–10)

## 2023-05-02 LAB — SEDIMENTATION RATE: Sed Rate: 2 mm/h (ref 0–32)

## 2023-05-02 LAB — ANA,IFA RA DIAG PNL W/RFLX TIT/PATN
ANA Titer 1: NEGATIVE
Cyclic Citrullin Peptide Ab: 5 U (ref 0–19)
Rheumatoid fact SerPl-aCnc: <10 [IU]/mL (ref <14.0)

## 2023-05-02 LAB — ESTRADIOL: Estradiol: 214 pg/mL

## 2023-05-02 LAB — FOLLICLE STIMULATING HORMONE: FSH: 23.6 m[IU]/mL

## 2023-05-02 LAB — TESTOSTERONE: Testosterone: 13 ng/dL (ref 4–50)

## 2023-05-07 ENCOUNTER — Encounter: Payer: 59 | Admitting: Internal Medicine

## 2023-05-13 ENCOUNTER — Encounter: Payer: Self-pay | Admitting: Cardiology

## 2023-05-13 NOTE — Progress Notes (Unsigned)
   ANNUAL EXAM Patient name: Kelsey [name] MRN [id number]  Date of birth: 1976-07-03 Chief Complaint:   No chief complaint on file.  History of Present Illness:   Kelsey [name] is a 47 y.o. [phone] female being seen today for a routine annual exam.   Current concerns: *** Current birth control: ***  No LMP recorded. (Menstrual status: IUD).   Last MXR: 2021 - Scheduled for 11/20 Last Pap/Pap History: ***. Results were: {Pap findings:25134}. H/O abnormal pap: yes   Health Maintenance Due  Topic Date Due   COVID-19 Vaccine (1) Never done   Hepatitis C Screening  Never done   Cervical Cancer Screening (HPV/Pap Cotest)  01/12/2021   MAMMOGRAM  06/21/2021   DTaP/Tdap/Td (3 - Td or Tdap) 08/31/2021   Colonoscopy  Never done   INFLUENZA VACCINE  02/06/2023    Review of Systems:   Pertinent items are noted in HPI Denies any headaches, blurred vision, fatigue, shortness of breath, chest pain, abdominal pain, abnormal vaginal discharge/itching/odor/irritation, problems with periods, bowel movements, urination, or intercourse unless otherwise stated above. *** Pertinent History Reviewed:  Reviewed past medical,surgical, social and family history.  Reviewed problem list, medications and allergies. Physical Assessment:  There were no vitals filed for this visit.There is no height or weight on file to calculate BMI.   Physical Examination:  General appearance - well appearing, and in no distress Mental status - alert, oriented to person, place, and time Psych:  She has a normal mood and affect Skin - warm and dry, normal color, no suspicious lesions noted Chest - effort normal Heart - normal rate  Breasts - breasts appear normal, no suspicious masses, no skin or nipple changes or axillary nodes Abdomen - soft, nontender, nondistended, no masses or organomegaly Pelvic -  VULVA: normal appearing vulva with no masses, tenderness or lesions  VAGINA: normal  appearing vagina with normal color and discharge, no lesions  CERVIX: normal appearing cervix without discharge or lesions, no CMT UTERUS: uterus is felt to be normal size, shape, consistency and nontender  ADNEXA: No adnexal masses or tenderness noted. Extremities:  No swelling or varicosities noted  Chaperone present for exam  No results found for this or any previous visit (from the past 24 hour(s)).  Assessment & Plan:  Diagnoses and all orders for this visit:  Encounter for annual routine gynecological examination  - Cervical cancer screening: Discussed guidelines. Pap with HPV done - STD Testing: {Blank single:19197::"accepts","declines","not indicated"} - Birth Control: Discussed options and their risks, benefits and common side effects; discussed VTE with estrogen containing options. Desires: {Birth control type:23956} - Breast Health: Encouraged self breast awareness/SBE. Teaching provided. Discussed limits of clinical breast exam for detecting breast cancer.  Scheduled for 11/20.  - F/U 12 months and prn     No orders of the defined types were placed in this encounter.   Meds: No orders of the defined types were placed in this encounter.   Follow-up: No follow-ups on file.  Milas Hock, MD 05/13/2023 11:47 AM

## 2023-05-14 ENCOUNTER — Other Ambulatory Visit (HOSPITAL_COMMUNITY)
Admission: RE | Admit: 2023-05-14 | Discharge: 2023-05-14 | Disposition: A | Discharge status: Home / Self Care | Payer: 59 | Source: Ambulatory Visit | Attending: Obstetrics and Gynecology | Admitting: Obstetrics and Gynecology

## 2023-05-14 ENCOUNTER — Ambulatory Visit: Payer: 59 | Admitting: Obstetrics and Gynecology

## 2023-05-14 ENCOUNTER — Encounter: Payer: Self-pay | Admitting: Obstetrics and Gynecology

## 2023-05-14 VITALS — BP 136/92 | HR 98 | Ht 63.0 in | Wt 136.0 lb

## 2023-05-14 DIAGNOSIS — L299 Pruritus, unspecified: Secondary | ICD-10-CM

## 2023-05-14 DIAGNOSIS — Z01419 Encounter for gynecological examination (general) (routine) without abnormal findings: Secondary | ICD-10-CM | POA: Insufficient documentation

## 2023-05-14 DIAGNOSIS — N951 Menopausal and female climacteric states: Secondary | ICD-10-CM | POA: Diagnosis not present

## 2023-05-14 DIAGNOSIS — R6882 Decreased libido: Secondary | ICD-10-CM

## 2023-05-14 DIAGNOSIS — Z1151 Encounter for screening for human papillomavirus (HPV): Secondary | ICD-10-CM

## 2023-05-14 MED ORDER — ESTRADIOL 0.0375 MG/24HR TD PTWK: 0.0375 mg | MEDICATED_PATCH | TRANSDERMAL | 12 refills | Status: DC

## 2023-05-14 MED ORDER — TRIAMCINOLONE ACETONIDE 0.025 % EX OINT: 1.0000 | TOPICAL_OINTMENT | Freq: Two times a day (BID) | CUTANEOUS | 0 refills | Status: AC

## 2023-05-16 LAB — CYTOLOGY - PAP
Adequacy: ABSENT
Comment: NEGATIVE
Diagnosis: NEGATIVE
High risk HPV: NEGATIVE

## 2023-05-17 LAB — TSH: TSH: 1.99 u[IU]/mL (ref 0.450–4.500)

## 2023-05-17 LAB — TESTOSTERONE,FREE AND TOTAL
Testosterone, Free: 1.3 pg/mL (ref 0.0–4.2)
Testosterone: 23 ng/dL (ref 4–50)

## 2023-05-19 ENCOUNTER — Other Ambulatory Visit: Payer: Self-pay

## 2023-05-19 MED ORDER — CARVEDILOL 12.5 MG PO TABS: 12.5000 mg | ORAL_TABLET | Freq: Two times a day (BID) | ORAL | 0 refills | Status: DC

## 2023-05-19 MED ORDER — VALSARTAN 40 MG PO TABS: 40.0000 mg | ORAL_TABLET | Freq: Two times a day (BID) | ORAL | 0 refills | Status: DC

## 2023-05-28 ENCOUNTER — Other Ambulatory Visit: Payer: Self-pay | Admitting: Family Medicine

## 2023-05-28 ENCOUNTER — Ambulatory Visit
Admission: RE | Admit: 2023-05-28 | Discharge: 2023-05-28 | Disposition: A | Discharge status: Home / Self Care | Payer: 59 | Source: Ambulatory Visit | Attending: Family Medicine

## 2023-05-28 DIAGNOSIS — Z1231 Encounter for screening mammogram for malignant neoplasm of breast: Secondary | ICD-10-CM

## 2023-05-28 DIAGNOSIS — Z1211 Encounter for screening for malignant neoplasm of colon: Secondary | ICD-10-CM

## 2023-05-28 DIAGNOSIS — L299 Pruritus, unspecified: Secondary | ICD-10-CM

## 2023-05-28 DIAGNOSIS — E538 Deficiency of other specified B group vitamins: Secondary | ICD-10-CM

## 2023-05-28 DIAGNOSIS — R232 Flushing: Secondary | ICD-10-CM

## 2023-05-28 DIAGNOSIS — M2550 Pain in unspecified joint: Secondary | ICD-10-CM

## 2023-06-03 LAB — COLOGUARD: COLOGUARD: NEGATIVE

## 2023-06-03 NOTE — Progress Notes (Signed)
Please call patient. Normal mammogram.  Repeat in 1 year.  

## 2023-06-04 NOTE — Progress Notes (Signed)
Great news! Your Cologuard test is negative.  Recommend repeat colon cancer screening in 3 years.

## 2023-06-08 ENCOUNTER — Encounter: Payer: Self-pay | Admitting: Obstetrics and Gynecology

## 2023-06-08 DIAGNOSIS — N951 Menopausal and female climacteric states: Secondary | ICD-10-CM

## 2023-06-10 MED ORDER — ESTRADIOL 0.05 MG/24HR TD PTWK: 0.0500 mg | MEDICATED_PATCH | TRANSDERMAL | 12 refills | Status: DC

## 2023-06-12 ENCOUNTER — Ambulatory Visit (INDEPENDENT_AMBULATORY_CARE_PROVIDER_SITE_OTHER): Payer: 59 | Admitting: Family Medicine

## 2023-06-12 ENCOUNTER — Encounter: Payer: Self-pay | Admitting: Family Medicine

## 2023-06-12 ENCOUNTER — Other Ambulatory Visit: Payer: Self-pay

## 2023-06-12 VITALS — BP 138/92 | HR 86 | Ht 63.0 in | Wt 138.8 lb

## 2023-06-12 DIAGNOSIS — R5383 Other fatigue: Secondary | ICD-10-CM | POA: Diagnosis not present

## 2023-06-12 DIAGNOSIS — R9082 White matter disease, unspecified: Secondary | ICD-10-CM | POA: Diagnosis not present

## 2023-06-12 DIAGNOSIS — R590 Localized enlarged lymph nodes: Secondary | ICD-10-CM | POA: Diagnosis not present

## 2023-06-12 DIAGNOSIS — N951 Menopausal and female climacteric states: Secondary | ICD-10-CM

## 2023-06-12 MED ORDER — ESTRADIOL 0.05 MG/24HR TD PTWK: 0.0500 mg | MEDICATED_PATCH | TRANSDERMAL | 12 refills | Status: DC

## 2023-06-12 NOTE — Assessment & Plan Note (Addendum)
-   discussed with patient about ob recommendations that the fatigue could be related to perimenopause and she has started the patch. We will continue to investigate and discussed options such as wellbutrin for treatment

## 2023-06-12 NOTE — Progress Notes (Signed)
Estradiol patch Rx originally sent to incorrect pharmacy. Rx now sent to correct pharmacy .

## 2023-06-12 NOTE — Assessment & Plan Note (Signed)
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 Have done a referral to neurology for further investigation

## 2023-06-12 NOTE — Progress Notes (Signed)
Established patient visit   Patient: Kelsey [name]   DOB: [birth year]   47 y.o. Female  MRN: [id number] Visit Date: 06/12/2023  Today's healthcare provider: Charlton Amor, DO   Chief Complaint  Patient presents with   Medical Management of Chronic Issues    Fatigue and swollen lymph    SUBJECTIVE    Chief Complaint  Patient presents with   Medical Management of Chronic Issues    Fatigue and swollen lymph   HPI HPI     Medical Management of Chronic Issues    Additional comments: Fatigue and swollen lymph      Last edited by Roselyn Reef, CMA on 06/12/2023  2:34 PM.      Pt presents today with concerns of fatigue and swollen lymph nodes that has been ongoing for three months now. At last visit, blood work was ordered to assess for arthralgias and she had negative ESR, CRP, and ANA was negative.   She was restarted back on her b12 injections and has not yet noticed much of a difference. She does see physical medicine and is on naltrexone, modafinil, and cymbalta through them.   She was seen by Obgyn for this and they did a workup and believe this is related to menopausal symptoms and recommended the patch.  Review of Systems  Constitutional:  Negative for activity change, fatigue and fever.  Respiratory:  Negative for cough and shortness of breath.   Cardiovascular:  Negative for chest pain.  Gastrointestinal:  Negative for abdominal pain.  Genitourinary:  Negative for difficulty urinating.       Current Meds  Medication Sig   albuterol (VENTOLIN HFA) 108 (90 Base) MCG/ACT inhaler Inhale 2 puffs into the lungs as needed for wheezing or shortness of breath.   Ascorbic Acid (VITAMIN C PO) Take 1 tablet by mouth daily.   Betamethasone Valerate 0.12 % foam USE DAILY AS DIRECTED   carvedilol (COREG) 12.5 MG tablet Take 1 tablet (12.5 mg total) by mouth 2 (two) times daily. KEEP APPOINTMENT ON WITH DR. TOBB TO RECEIVE FURTHER REFILLS.   Cholecalciferol  (VITAMIN D3 PO) Take 1 tablet by mouth daily.   cyanocobalamin (VITAMIN B12) 1000 MCG/ML injection Inject 1 mL (1,000 mcg total) into the muscle every 30 (thirty) days.   DULoxetine (CYMBALTA) 60 MG capsule Take 60 mg by mouth daily.   estradiol (CLIMARA) 0.05 mg/24hr patch Place 1 patch (0.05 mg total) onto the skin once a week.   ketoconazole (NIZORAL) 2 % shampoo APPLY 5-10ML TO WET SCALP. LATHER AND LEAVE ON FOR 3-5 MINUTES THEN RINSE. USE TWICE WEEKLY FOR 2-4 WEEKS   levonorgestrel (LILETTA, 52 MG,) 20.1 MCG/DAY IUD IUD 1 each by Intrauterine route once.   modafinil (PROVIGIL) 200 MG tablet Take 1 tablet (200 mg total) by mouth daily.   Omega-3 Fatty Acids (FISH OIL PO) Take 1 capsule by mouth daily.   Pyridoxine HCl (VITAMIN B-6 PO) Take 1 tablet by mouth daily.   Semaglutide-Weight Management 1 MG/0.5ML SOAJ Inject into the skin.   topiramate (TOPAMAX) 25 MG tablet Take 1 tablet (25 mg total) by mouth at bedtime.   triamcinolone (KENALOG) 0.025 % ointment Apply 1 Application topically 2 (two) times daily.   valACYclovir (VALTREX) 500 MG tablet Take 1 tablet (500 mg total) by mouth daily.   valsartan (DIOVAN) 40 MG tablet Take 1 tablet (40 mg total) by mouth 2 (two) times daily. KEEP APPOINTMENT ON WITH DR. TOBB TO RECEIVE FURTHER REFILLS.  Vitamin D, Ergocalciferol, (DRISDOL) 1.25 MG (50000 UNIT) CAPS capsule Take 1 capsule (50,000 Units total) by mouth every 7 (seven) days.    OBJECTIVE    BP (!) 138/92 (BP Location: Left Arm, Patient Position: Sitting, Cuff Size: Large)   Pulse 86   Ht 5\' 3"  (1.6 m)   Wt 138 lb 12 oz (62.9 kg)   LMP  (LMP Unknown)   SpO2 98%   BMI 24.58 kg/m   Physical Exam Vitals and nursing note reviewed.  Constitutional:      General: She is not in acute distress.    Appearance: Normal appearance.  HENT:     Head: Normocephalic and atraumatic.     Comments: lymphadenopathy    Right Ear: External ear normal.     Left Ear: External ear normal.      Nose: Nose normal.  Eyes:     Conjunctiva/sclera: Conjunctivae normal.  Cardiovascular:     Rate and Rhythm: Normal rate.  Pulmonary:     Effort: Pulmonary effort is normal.  Neurological:     General: No focal deficit present.     Mental Status: She is alert and oriented to person, place, and time.  Psychiatric:        Mood and Affect: Mood normal.        Behavior: Behavior normal.        Thought Content: Thought content normal.        Judgment: Judgment normal.        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 Have done a referral to neurology for further investigation      Relevant Orders   Ambulatory referral to Neurology     Immune and Lymphatic   Lymphadenopathy, cervical - Primary    Persistent lymphadenopathy and all lab work up has been negative. Will go ahead and order CT head and neck - have also ordered a cbc with diff to see if this can shed any light on the cause of the lymphadenopathy      Relevant Orders   CBC with Differential   CT SOFT TISSUE NECK W WO CONTRAST     Other   Other fatigue    - discussed with patient about ob recommendations that the fatigue could be related to perimenopause and she has started the patch. We will continue to investigate and discussed options such as wellbutrin for treatment        No follow-ups on file.      No orders of the defined types were placed in this encounter.   Orders Placed This Encounter  Procedures   CT SOFT TISSUE NECK W WO CONTRAST    Standing Status:   Future    Standing Expiration Date:   06/11/2024    Order Specific Question:   If indicated for the ordered procedure, I authorize the administration of contrast media per Radiology protocol    Answer:   Yes    Order Specific Question:   Does the patient have a contrast media/X-ray dye  allergy?    Answer:   No    Order Specific Question:   Is patient pregnant?    Answer:   No    Order Specific Question:   Preferred imaging location?    Answer:  [facility]   CBC with Differential   Ambulatory referral to Neurology    Referral Priority:   Routine    Referral Type:   Consultation    Referral Reason:   Specialty Services Required    Requested Specialty:   Neurology    Number of Visits Requested:   1     Charlton Amor, DO  [facility] at [facility] [phone] (phone) [phone] (fax)  [facility]

## 2023-06-12 NOTE — Assessment & Plan Note (Signed)
Persistent lymphadenopathy and all lab work up has been negative. Will go ahead and order CT head and neck - have also ordered a cbc with diff to see if this can shed any light on the cause of the lymphadenopathy

## 2023-06-13 LAB — CBC WITH DIFFERENTIAL/PLATELET
Basophils Absolute: 0 10*3/uL (ref 0.0–0.2)
Basos: 1 %
EOS (ABSOLUTE): 0.1 10*3/uL (ref 0.0–0.4)
Eos: 2 %
Hematocrit: 41.6 % (ref 34.0–46.6)
Hemoglobin: 13.5 g/dL (ref 11.1–15.9)
Immature Grans (Abs): 0 10*3/uL (ref 0.0–0.1)
Immature Granulocytes: 0 %
Lymphocytes Absolute: 1.9 10*3/uL (ref 0.7–3.1)
Lymphs: 39 %
MCH: 30.8 pg (ref 26.6–33.0)
MCHC: 32.5 g/dL (ref 31.5–35.7)
MCV: 95 fL (ref 79–97)
Monocytes Absolute: 0.4 10*3/uL (ref 0.1–0.9)
Monocytes: 9 %
Neutrophils Absolute: 2.5 10*3/uL (ref 1.4–7.0)
Neutrophils: 49 %
Platelets: 262 10*3/uL (ref 150–450)
RBC: 4.38 x10E6/uL (ref 3.77–5.28)
RDW: 11.9 % (ref 11.7–15.4)
WBC: 5 10*3/uL (ref 3.4–10.8)

## 2023-06-19 ENCOUNTER — Ambulatory Visit (INDEPENDENT_AMBULATORY_CARE_PROVIDER_SITE_OTHER): Payer: 59 | Admitting: Physician Assistant

## 2023-06-19 ENCOUNTER — Other Ambulatory Visit: Payer: 59

## 2023-06-19 ENCOUNTER — Encounter: Payer: Self-pay | Admitting: Physician Assistant

## 2023-06-19 VITALS — BP 115/81 | HR 109 | Resp 20 | Ht 63.0 in | Wt 140.0 lb

## 2023-06-19 DIAGNOSIS — R590 Localized enlarged lymph nodes: Secondary | ICD-10-CM | POA: Diagnosis not present

## 2023-06-19 DIAGNOSIS — R9082 White matter disease, unspecified: Secondary | ICD-10-CM

## 2023-06-19 DIAGNOSIS — R413 Other amnesia: Secondary | ICD-10-CM

## 2023-06-19 NOTE — Patient Instructions (Addendum)
MRI brain with and without contrast  Check labs A1C Jul 25 2023 at 9 am

## 2023-06-19 NOTE — Progress Notes (Signed)
Assessment/Plan:   Kelsey [name] is a very pleasant 47 y.o. year old RH female with a history of hypertension after Covid 2021, hyperlipidemia, chronic joint pain syndrome followed by physical medicine, chronic fatigue, prolonged Qt per history, cervical lymphadenopathy followed by PCP with ongoing workup, seen today for evaluation of  "age advanced white matter disease" seen per CT head 2023.   Workup is in progress.   Recommendations MRI brain with and without contrast to further evaluate abnormal CT head  Recommend good control of cardiovascular risk factors.    Follow up in 1 month, pending on results she may need further studies  Follow up cervical lymphadenopathy She had a recent lipid panel, results not available for review. If not done, recommend A1C Will follow  HPI  Due to headaches, in the past, the patient had a CT head which was remarkable for advanced age white matter disease without any acute issues. No further imaging was done at the time.  She then saw her PCP, who recommended further workup.  The patient reports that she never had a TIA or CVA.  She had 2 episodes of vertigo in 2016 and 2020 which sounds positional "room spinning with nausea, if lying down better" (chronic sinus infection).  Reports dizziness "from high to low "(orthostatic). Reports less acuity when reading "words may move" although no frank double vision or blindness. She reports having BP related  headaches similar to years ago, starting in the afternoon when BP is high, and "I hear my heart beating when that happens". She took Topamax in the past and was helping at the time, has not tried it recently but is about to do so.  Sometimes, she tries to say a word and cannot get a word out, and has and some fogginess".  She reports "I am thirsty all the time ", but denies any history of diabetes.. She reports that they have been times that gotten more difficult to determine "what is the next thing I need  to do ". She reports a history of PTSD, on therapy and at times that has impacted her mood but recently resolved and feels better.  Denies seizures. 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 She reports "burning in the chest, being studied, unsure if it is cardiac or GERD".  She has any issues with constipation and diarrhea likely due to gluten allergy.  Occasional shortness of breath. Denies any fever or chills ,she has nightly night sweats for the last 2 months. "I am cold all the time" she had in the past with normal TSH, but in the distant past she was being evaluated for hypothyroidism. No tobacco. No new meds other than adding Wellbutrin as of yesterday by psychiatry.  She has increased estradiol patch 1 month ago.   No history of PE or DVT. Does not take a regular ASA a day. Denies any recent long distance trips or recent surgeries. No sick contacts. No new stressors present in personal life Patient is compliant with his medications. Patient is very active, walking frequently and yoga.  I have always been clumsy, "tripping over things that would cause me to trip over".  No history of MS in the family.  She works independently as a Chartered loss adjuster   Past Medical History:  Diagnosis Date   Abnormal Pap smear    Anemia    Asthma 1996   Bipolar disorder (HCC)    Chronic sinusitis    GERD (gastroesophageal reflux disease)  2009   Headache(784.0)    Hypertension 2021   Pyelonephritis 2008   Pylo and stones in the past   Sinus problem    sinusitis   Thyroid disease 2017     Past Surgical History:  Procedure Laterality Date   CESAREAN SECTION     CESAREAN SECTION  08/31/2011   Procedure: CESAREAN SECTION;  Surgeon: Tresa Endo A. Ernestina Penna, MD;  Location: [facility];  Service: Gynecology;  Laterality: [name];  Repeat cesarean section with delivery of baby    COLPOSCOPY     KNEE ARTHROSCOPY     right knee   WISDOM TOOTH EXTRACTION       Allergies  Allergen Reactions   Morphine Other (See  Comments)   Morphine And Codeine Hives   Morphine Sulfate Other (See Comments)   Other Other (See Comments)   Sulfa Antibiotics Nausea And Vomiting   Oseltamivir Nausea And Vomiting and Other (See Comments)    Current Outpatient Medications  Medication Instructions   albuterol (VENTOLIN HFA) 108 (90 Base) MCG/ACT inhaler 2 puffs, As needed   Ascorbic Acid (VITAMIN C PO) 1 tablet, Daily   Betamethasone Valerate 0.12 % foam USE DAILY AS DIRECTED   buPROPion ER (WELLBUTRIN SR) 100 mg, 2 times daily   carvedilol (COREG) 12.5 mg, Oral, 2 times daily, KEEP APPOINTMENT ON WITH DR. TOBB TO RECEIVE FURTHER REFILLS.   Cholecalciferol (VITAMIN D3 PO) 1 tablet, Oral, Daily   cyanocobalamin (VITAMIN B12) 1,000 mcg, Intramuscular, Every 30 days   DULoxetine (CYMBALTA) 30 mg, Daily   estradiol (CLIMARA) 0.05 mg, Transdermal, Weekly   ketoconazole (NIZORAL) 2 % shampoo APPLY 5-10ML TO WET SCALP. LATHER AND LEAVE ON FOR 3-5 MINUTES THEN RINSE. USE TWICE WEEKLY FOR 2-4 WEEKS   levonorgestrel (LILETTA, 52 MG,) 20.1 MCG/DAY IUD IUD 1 each,  Once   modafinil (PROVIGIL) 200 mg, Oral, Daily   Omega-3 Fatty Acids (FISH OIL PO) 1 capsule, Daily   Pyridoxine HCl (VITAMIN B-6 PO) 1 tablet, Daily   Semaglutide-Weight Management 1 MG/0.5ML SOAJ Inject into the skin.   topiramate (TOPAMAX) 25 mg, Oral, Nightly   triamcinolone (KENALOG) 0.025 % ointment 1 Application, Topical, 2 times daily   valACYclovir (VALTREX) 500 mg, Oral, Daily   valsartan (DIOVAN) 40 mg, Oral, 2 times daily, KEEP APPOINTMENT ON WITH DR. TOBB TO RECEIVE FURTHER REFILLS.   Vitamin D (Ergocalciferol) (DRISDOL) 50,000 Units, Oral, Every 7 days     VITALS:   Vitals:   06/19/23 0745  BP: 115/81  Pulse: (!) 109  Resp: 20  SpO2: 100%  Weight: 140 lb (63.5 kg)  Height: 5\' 3"  (1.6 m)      PHYSICAL EXAM   HEENT:  Normocephalic, atraumatic. The superficial temporal arteries are without ropiness or tenderness. Cardiovascular: Mildly  tachycardic, regular rate and rhythm.  Very soft 1 out of 6 murmur Lungs: Clear to auscultation bilaterally. Neck: There are no carotid bruits noted bilaterally..  Mild lymphadenopathy bilaterally at the submandibular area.     NEUROLOGICAL:    Orientation:  Alert and oriented to person, place and time. No aphasia or dysarthria. Fund of knowledge is appropriate. Recent memory impaired and remote memory intact.  Attention and concentration are normal.  Cranial nerves: There is good facial symmetry. Extraocular muscles are intact and visual fields are full to confrontational testing. Speech is fluent and clear. No tongue deviation. Hearing is intact to conversational tone. Tone: Tone is good throughout. Sensation: Sensation is intact to light touch. Vibration is intact  Coordination: The patient has no difficulty with RAM's or FNF bilaterally. Normal finger to nose  Motor: Strength is 5/5 in the bilateral upper and lower extremities. There is no pronator drift. There are no fasciculations noted. DTR's: Deep tendon reflexes are 2/4 bilaterally. Gait and Station: The patient is able to ambulate without difficulty The patient is able to heel toe walk . Gait is cautious and narrow. The patient is able to ambulate in a tandem fashion.       Thank you for allowing [place] the opportunity to participate in the care of this nice patient. Please do not hesitate to contact us for any questions or concerns.   Total time spent on today's visit was 78 minutes dedicated to this patient today, preparing to see patient, examining the patient, ordering tests and/or medications and counseling the patient, documenting clinical information in the EHR or other health record, independently interpreting results and communicating results to the patient/family, discussing treatment and goals, answering patient's questions and coordinating care.  Cc:  Arlean Hopping [facility] 06/19/2023 9:49 AM

## 2023-06-20 ENCOUNTER — Encounter: Payer: Self-pay | Admitting: Cardiology

## 2023-06-20 ENCOUNTER — Encounter: Payer: 59 | Attending: Physical Medicine and Rehabilitation | Admitting: Physical Medicine and Rehabilitation

## 2023-06-20 ENCOUNTER — Ambulatory Visit: Payer: 59 | Attending: Cardiology | Admitting: Cardiology

## 2023-06-20 VITALS — BP 128/90 | HR 87 | Ht 63.0 in | Wt 138.0 lb

## 2023-06-20 VITALS — BP 108/77 | HR 89 | Ht 63.0 in | Wt 135.0 lb

## 2023-06-20 DIAGNOSIS — R0789 Other chest pain: Secondary | ICD-10-CM

## 2023-06-20 DIAGNOSIS — R5383 Other fatigue: Secondary | ICD-10-CM

## 2023-06-20 DIAGNOSIS — G894 Chronic pain syndrome: Secondary | ICD-10-CM

## 2023-06-20 DIAGNOSIS — E785 Hyperlipidemia, unspecified: Secondary | ICD-10-CM

## 2023-06-20 DIAGNOSIS — G4701 Insomnia due to medical condition: Secondary | ICD-10-CM

## 2023-06-20 DIAGNOSIS — F411 Generalized anxiety disorder: Secondary | ICD-10-CM | POA: Diagnosis present

## 2023-06-20 DIAGNOSIS — I10 Essential (primary) hypertension: Secondary | ICD-10-CM

## 2023-06-20 LAB — HIV ANTIBODY (ROUTINE TESTING W REFLEX): HIV 1&2 Ab, 4th Generation: NONREACTIVE

## 2023-06-20 MED ORDER — VALSARTAN 80 MG PO TABS: 80.0000 mg | ORAL_TABLET | Freq: Two times a day (BID) | ORAL | 3 refills | Status: AC

## 2023-06-20 MED ORDER — CARVEDILOL 12.5 MG PO TABS: 12.5000 mg | ORAL_TABLET | Freq: Two times a day (BID) | ORAL | 3 refills | Status: DC

## 2023-06-20 MED ORDER — VALACYCLOVIR HCL 500 MG PO TABS: 500.0000 mg | ORAL_TABLET | Freq: Every day | ORAL | 3 refills | Status: DC

## 2023-06-20 MED ORDER — ATORVASTATIN CALCIUM 10 MG PO TABS: 10.0000 mg | ORAL_TABLET | Freq: Every day | ORAL | 3 refills | Status: DC

## 2023-06-20 MED ORDER — ATORVASTATIN CALCIUM 10 MG PO TABS: 10.0000 mg | ORAL_TABLET | Freq: Every evening | ORAL | 1 refills | Status: DC

## 2023-06-20 MED ORDER — CARVEDILOL 12.5 MG PO TABS: 12.5000 mg | ORAL_TABLET | Freq: Two times a day (BID) | ORAL | 0 refills | Status: DC

## 2023-06-20 MED ORDER — ATORVASTATIN CALCIUM 10 MG PO TABS: 10.0000 mg | ORAL_TABLET | Freq: Every evening | ORAL | 3 refills | Status: DC

## 2023-06-20 NOTE — Patient Instructions (Addendum)
Stelo by Dexcom  Methylated B vitamin or eat green leafy vegetables  Provided with list of supplements that can help with dyslipidemia: 1) Vitamin B3 500-4,000mg  in divided doses daily (would recommend starting low as can cause uncomfortable facial flushing if started at too high a dose) 2) Phytosterols 2.15 grams daily 3) Fermented soy 30-50 grams daily 4) EGCG (found in green tea): 500-1000mg  daily 5) Omega-3 fatty acids 3000-5,000mg  daily 6) Flax seed 40 grams daily 7) Monounsaturated fats 20-40 grams daily (olives, olive oil, nuts), also reduces cardiovascular disease 8) Sesame: 40 grams daily 9) Gamma/delta tocotrienols- a family of unsaturated forms of Vitamin E- 200mg  with dinner 10) Pantethine 900mg  daily in divided doses 11) Resveratrol 250mg  daily 12) N Acetyl Cysteine 2000mg  daily in divided doses 13) Curcumin 2000-5000mg  in divided doses daily 14) Pomegranate juice: 8 ounces daily, also helps to lower blood pressure 15) Pomegranate seeds one cup daily, also helps to lower blood pressure 16) Citrus Bergamot 1000mg  daily, also helps with glucose control and weight loss 17) Vitamin C 500mg  daily 18) Quercetin 500-1000mg  daily 19) Glutathione 20) Probiotics 60-100 billion organisms per day 21) Fiber 22) Oats 23) Aged garlic (can eat as food or supplement of 600-900mg  per day) 24) Chia seeds 25 grams per day 25) Lycopene- carotenoid found in high concentrations in tomatoes. 26) Alpha linolenic acid 27) Flavonoids and anthocyanins 28) Wogonin- flavanoid that enhances reverse cholesterol transport 29) Coenzyme Q10 30) Pantethine- derivative of Vitamin B5: 300mg  three times per day or 450mg  twice per day with or without food 31) Barley and other whole grains 32) Orange juice 33) L- carnitine 34) L- Lysine 35) L- Arginine 36) Almonds 37) Morin 38) Rutin 39) Carnosine 40) Histidine  41) Kaempferol  42) Organosulfur compounds 43) Vitamin E 44) Oleic acid 45) RBO  (ferulic acid gammaoryzanol) 46) grape seed extract 47) Red wine 48) Berberine HCL 500mg  daily or twice per day- more effective and with fewer adverse effects that ezetimibe monotherapy 49) red yeast rice 2400- 4800 mg/day 50) chlorella 51) Licorice

## 2023-06-20 NOTE — Patient Instructions (Addendum)
Medication Instructions:  Your physician has recommended you make the following change in your medication:  START: Lipitor 10 mg at night START: Valsartan 80 mg twice daily *If you need a refill on your cardiac medications before your next appointment, please call your pharmacy*   Lab Work: Lipids If you have labs (blood work) drawn today and your tests are completely normal, you will receive your results only by: MyChart Message (if you have MyChart) OR A paper copy in the mail If you have any lab test that is abnormal or we need to change your treatment, we will call you to review the results.  Follow-Up: At [facility], you and your health needs are our priority.  As part of our continuing mission to provide you with exceptional heart care, we have created designated Provider Care Teams.  These Care Teams include your primary Cardiologist (physician) and Advanced Practice Providers (APPs -  Physician Assistants and Nurse Practitioners) who all work together to provide you with the care you need, when you need it.   Your next appointment:   12 week(s)  Provider:   Thomasene Ripple, DO     Other Instructions You are invited to attend: [facility] event on February Friday 7th 2025 at [facility] ([address] [place], [place], [place] [address]) from 8am-12pm. Feel free to invite other women to attend!  See you there!     Mediterranean Diet A Mediterranean diet is based on the traditions of countries on the [employer]. It focuses on eating more: Fruits and vegetables. Whole grains, beans, nuts, and seeds. Heart-healthy fats. These are fats that are good for your heart. It involves eating less: Dairy. Meat and eggs. Processed foods with added sugar, salt, and fat. This type of diet can help prevent certain conditions. It can also improve outcomes if you have a long-term (chronic) disease, such as kidney or heart disease. What are tips for following this  plan? Reading food labels Check packaged foods for: The serving size. For foods such as rice and pasta, the serving size is the amount of cooked product, not dry. The total fat. Avoid foods with saturated fat or trans fat. Added sugars, such as corn syrup. Shopping  Try to have a balanced diet. Buy a variety of foods, such as: Fresh fruits and vegetables. You may be able to get these from local farmers markets. You can also buy them frozen. Grains, beans, nuts, and seeds. Some of these can be bought in bulk. Fresh seafood. Poultry and eggs. Low-fat dairy products. Buy whole ingredients instead of foods that have already been packaged. If you can't get fresh seafood, buy precooked frozen shrimp or canned fish, such as tuna, salmon, or sardines. Stock your pantry so you always have certain foods on hand, such as olive oil, canned tuna, canned tomatoes, rice, pasta, and beans. Cooking Cook foods with extra-virgin olive oil instead of using butter or other vegetable oils. Have meat as a side dish. Have vegetables or grains as your main dish. This means having meat in small portions or adding small amounts of meat to foods like pasta or stew. Use beans or vegetables instead of meat in common dishes like chili or lasagna. Try out different cooking methods. Try roasting, broiling, steaming, and sauting vegetables. Add frozen vegetables to soups, stews, pasta, or rice. Add nuts or seeds for added healthy fats and plant protein at each meal. You can add these to yogurt, salads, or vegetable dishes. Marinate  fish or vegetables using olive oil, lemon juice, garlic, and fresh herbs. Meal planning Plan to eat a vegetarian meal one day each week. Try to work up to two vegetarian meals, if possible. Eat seafood two or more times a week. Have healthy snacks on hand. These may include: Vegetable sticks with hummus. Greek yogurt. Fruit and nut trail mix. Eat balanced meals. These should  include: Fruit: 2-3 servings a day. Vegetables: 4-5 servings a day. Low-fat dairy: 2 servings a day. Fish, poultry, or lean meat: 1 serving a day. Beans and legumes: 2 or more servings a week. Nuts and seeds: 1-2 servings a day. Whole grains: 6-8 servings a day. Extra-virgin olive oil: 3-4 servings a day. Limit red meat and sweets to just a few servings a month. Lifestyle  Try to cook and eat meals with your family. Drink enough fluid to keep your pee (urine) pale yellow. Be active every day. This includes: Aerobic exercise, which is exercise that causes your heart to beat faster. Examples include running and swimming. Leisure activities like gardening, walking, or housework. Get 7-8 hours of sleep each night. Drink red wine if your provider says you can. A glass of wine is 5 oz (150 mL). You may be allowed to have: Up to 1 glass a day if you're female and not pregnant. Up to 2 glasses a day if you're female. What foods should I eat? Fruits Apples. Apricots. Avocado. Berries. Bananas. Cherries. Dates. Figs. Grapes. Lemons. Melon. Oranges. Peaches. Plums. Pomegranate. Vegetables Artichokes. Beets. Broccoli. Cabbage. Carrots. Eggplant. Green beans. Chard. Kale. Spinach. Onions. Leeks. Peas. Squash. Tomatoes. Peppers. Radishes. Grains Whole-grain pasta. Brown rice. Bulgur wheat. Polenta. Couscous. Whole-wheat bread. Orpah Cobb. Meats and other proteins Beans. Almonds. Sunflower seeds. Pine nuts. Peanuts. Cod. Salmon. Scallops. Shrimp. Tuna. Tilapia. Clams. Oysters. Eggs. Chicken or [place] without skin. Dairy Low-fat milk. Cheese. Greek yogurt. Fats and oils Extra-virgin olive oil. Avocado oil. Grapeseed oil. Beverages Water. Red wine. Herbal tea. Sweets and desserts Greek yogurt with honey. Baked apples. Poached pears. Trail mix. Seasonings and condiments Basil. Cilantro. Coriander. Cumin. Mint. Parsley. Sage. Rosemary. Tarragon. Garlic. Oregano. Thyme. Pepper. Balsamic  vinegar. Tahini. Hummus. Tomato sauce. Olives. Mushrooms. The items listed above may not be all the foods and drinks you can have. Talk to a dietitian to learn more. What foods should I limit? This is a list of foods that should be eaten rarely. Fruits Fruit canned in syrup. Vegetables Deep-fried potatoes, like [place] fries. Grains Packaged pasta or rice dishes. Cereal with added sugar. Snacks with added sugar. Meats and other proteins Beef. Pork. Lamb. Chicken or [place] with skin. Hot dogs. Tomasa Blase. Dairy Ice cream. Sour cream. Whole milk. Fats and oils Butter. Canola oil. Vegetable oil. Beef fat (tallow). Lard. Beverages Juice. Sugar-sweetened soft drinks. Beer. Liquor and spirits. Sweets and desserts Cookies. Cakes. Pies. Candy. Seasonings and condiments Mayonnaise. Pre-made sauces and marinades. The items listed above may not be all the foods and drinks you should limit. Talk to a dietitian to learn more. Where to find more information American Heart Association (AHA): heart.org This information is not intended to replace advice given to you by your health care provider. Make sure you discuss any questions you have with your health care provider. Document Revised: 10/06/2022 Document Reviewed: 10/06/2022 Elsevier Patient Education  2024 Elsevier Inc.  Adopting a Healthy Lifestyle.   Know what a healthy weight is for you (roughly BMI <25) and aim to maintain this   Aim for 7+ servings of  fruits and vegetables daily   65-80+ fluid ounces of water or unsweet tea for healthy kidneys   Limit to max 1 drink of alcohol per day; avoid smoking/tobacco   Limit animal fats in diet for cholesterol and heart health - choose grass fed whenever available   Avoid highly processed foods, and foods high in saturated/trans fats   Aim for low stress - take time to unwind and care for your mental health   Aim for 150 min of moderate intensity exercise weekly for heart health, and weights  twice weekly for bone health   Aim for 7-9 hours of sleep daily   When it comes to diets, agreement about the perfect plan isnt easy to find, even among the experts. Experts at the [facility] of [employer] developed an idea known as the Healthy Eating Plate. Just imagine a plate divided into logical, healthy portions.   The emphasis is on diet quality:   Load up on vegetables and fruits - one-half of your plate: Aim for color and variety, and remember that potatoes dont count.   Go for whole grains - one-quarter of your plate: Whole wheat, barley, wheat berries, quinoa, oats, brown rice, and foods made with them. If you want pasta, go with whole wheat pasta.   Protein power - one-quarter of your plate: Fish, chicken, beans, and nuts are all healthy, versatile protein sources. Limit red meat.   The diet, however, does go beyond the plate, offering a few other suggestions.   Use healthy plant oils, such as olive, canola, soy, corn, sunflower and peanut. Check the labels, and avoid partially hydrogenated oil, which have unhealthy trans fats.   If youre thirsty, drink water. Coffee and tea are good in moderation, but skip sugary drinks and limit milk and dairy products to one or two daily servings.   The type of carbohydrate in the diet is more important than the amount. Some sources of carbohydrates, such as vegetables, fruits, whole grains, and beans-are healthier than others.

## 2023-06-20 NOTE — Progress Notes (Signed)
Subjective:    Patient ID: Kelsey [name], female    DOB: 06-02-1976, 47 y.o.   MRN: [id number]  HPI Kelsey [name] is a 47 year old woman who presents for f/u of chronic pain, anxiety.  1) Chronic pain -pain has been well controlled with some flucuations -pain has increased, would be interested in increased low dose naltrexone -taking magnesium and tart cherry juice -she can tell when she does not take the magnesium and potassium -pain has been better with naltrexone and cymbalta -tested positive for EBV and some of the banners of Lyme Disease -she was seeing an integrative medicine center -she tested positive for RA -she saw a rheumatologist and had Xrs done -pains started in 2017 and feels like a burning sensation -has not yet heard from [facility] regarding her LDN -willing to take additional vitamin D supplement  2) Fatigue -this has been the big thing -her PCP recommends wellbutrin -she has been to two different sleep clinics -she went to the Sleep center in [place] and both were negative -she asks about going up to 200mg  daily -sleep has not worsened since starting modafinil  3) Anxiety: -has been having stressors after her divorce with her ex-husband trying to gain more custody of her daughter. -she has seen a psychiatrist to deal with this. -she switched her Celexa to Cymbalta -when she had a break from her husband and was able to get off the medication and feel fine -started seeing a new psychiatrist in [place] and suggested trying the Cymbalta -she has been working on [place] therapy and grounding  -has been seeing a psychiatrist Dr. Jacob Moores who has been talking about how viral load affects mitochondira  4) Insomnia: -magnesium glycinate helps    Pain Inventory Average Pain 4 Pain Right Now 4 My pain is intermittent, burning, dull, and aching  In the last 24 hours, has pain interfered with the following? General activity  3 Relation with others 4 Enjoyment of life 4 What TIME of day is your pain at its worst? evening Sleep (in general) Fair  Pain is worse with: sitting Pain improves with: heat/ice Relief from Meds: 6      Family History  Problem Relation Age of Onset   Heart disease Brother        sudden cardiac death   Asthma Brother    Early death Brother    Heart disease Maternal Grandfather 59       MI   Coronary artery disease Maternal Grandfather    Diabetes Maternal Grandfather    Kidney disease Maternal Grandfather    Heart disease Mother    Depression Mother    Diabetes Mother    Alcohol abuse Paternal Grandfather    Cancer Paternal Grandfather    Cancer Paternal Grandmother    Stroke Neg Hx    Hypertension Neg Hx    Social History   Socioeconomic History   Marital status: Married    Spouse name: Not on file   Number of children: 2   Years of education: 18   Highest education level: Master's degree (e.g., MA, MS, MEng, MEd, MSW, MBA)  Occupational History   Occupation: clinical Hospital doctor: SELF-EMPLOYED  Tobacco Use   Smoking status: Never   Smokeless tobacco: Never  Vaping Use   Vaping status: Never Used  Substance and Sexual Activity   Alcohol use: No   Drug use: No   Sexual activity: Yes    Birth control/protection:  I.U.D.  Other Topics Concern   Not on file  Social History Narrative   1 daughter--Kelsey [name] born 12/09, 1 step son   Right handed   Works in social work   Occasionally caffeine   2 Administrator degree   Social Drivers of Corporate investment banker Strain: Low Risk  (06/11/2023)   Overall Financial Resource Strain (CARDIA)    Difficulty of Paying Living Expenses: Not very hard  Food Insecurity: No Food Insecurity (06/11/2023)   Hunger Vital Sign    Worried About Running Out of Food in the Last Year: Never true    Ran Out of Food in the Last Year: Never true  Transportation Needs: No Transportation Needs (06/11/2023)    PRAPARE - Administrator, Civil Service (Medical): No    Lack of Transportation (Non-Medical): No  Physical Activity: Insufficiently Active (06/11/2023)   Exercise Vital Sign    Days of Exercise per Week: 3 days    Minutes of Exercise per Session: 30 min  Stress: No Stress Concern Present (06/11/2023)   [employer] of Occupational Health - Occupational Stress Questionnaire    Feeling of Stress : Only a little  Social Connections: Socially Integrated (06/11/2023)   Social Connection and Isolation Panel [NHANES]    Frequency of Communication with Friends and Family: More than three times a week    Frequency of Social Gatherings with Friends and Family: More than three times a week    Attends Religious Services: More than 4 times per year    Active Member of Clubs or Organizations: Yes    Attends Banker Meetings: More than 4 times per year    Marital Status: Married   Past Surgical History:  Procedure Laterality Date   CESAREAN SECTION     CESAREAN SECTION  08/31/2011   Procedure: CESAREAN SECTION;  Surgeon: Tresa Endo A. Ernestina Penna, MD;  Location: [facility];  Service: Gynecology;  Laterality: [name];  Repeat cesarean section with delivery of baby    COLPOSCOPY     KNEE ARTHROSCOPY     right knee   WISDOM TOOTH EXTRACTION     Past Medical History:  Diagnosis Date   Abnormal Pap smear    Anemia    Asthma 1996   Bipolar disorder (HCC)    Chronic sinusitis    GERD (gastroesophageal reflux disease) 2009   Headache(784.0)    Hypertension 2021   Pyelonephritis 2008   Pylo and stones in the past   Sinus problem    sinusitis   Thyroid disease 2017   LMP  (LMP Unknown)   Opioid Risk Score:   Fall Risk Score:  `1  Depression screen [facility] 2/9     02/28/2023    9:56 AM 11/08/2022   10:46 AM 09/27/2022   10:11 AM 07/26/2022   10:54 AM 11/15/2021    4:00 PM 10/19/2020    7:59 AM 06/19/2020   12:32 PM  Depression screen PHQ 2/9  Decreased Interest 0 0 0 0 0 0 0   Down, Depressed, Hopeless 0 0 0 0 0 0 0  PHQ - 2 Score 0 0 0 0 0 0 0  Altered sleeping    1  0 0  Tired, decreased energy    1  0 0  Change in appetite    1  0 0  Feeling bad or failure about yourself     0  0 0  Trouble concentrating    1  0 0  Moving slowly or fidgety/restless    0  0 0  Suicidal thoughts    0  0 0  PHQ-9 Score    4  0 0  Difficult doing work/chores    Somewhat difficult  Not difficult at all Not difficult at all     Review of Systems  Constitutional: Negative.   HENT: Negative.    Eyes: Negative.   Respiratory: Negative.    Cardiovascular: Negative.   Gastrointestinal:  Positive for abdominal pain, constipation and diarrhea.  Endocrine: Negative.   Genitourinary: Negative.   Musculoskeletal:        Joint pain  Skin: Negative.   Allergic/Immunologic: Negative.   Neurological: Negative.   Hematological: Negative.   Psychiatric/Behavioral: Negative.    All other systems reviewed and are negative.      Objective:   Physical Exam Gen: no distress, normal appearing HEENT: oral mucosa pink and moist, NCAT Cardio: Reg rate Chest: normal effort, normal rate of breathing Abd: soft, non-distended Ext: no edema Psych: pleasant, normal affect Skin: intact Neuro: Alert and oriented x3     Assessment & Plan:  1) Chronic Pain Syndrome with diffuse joint pain -discussed trying to wean off the low dose naltrexone, discussed plan to stop and resume if needed -recommended using her hot tub, discussed benefits of sauna -Discussed current symptoms of pain and history of pain.  -discussed her history of LONG COVID in 2021 and EBV and Lyme positive markers -discussed her history of being on antibiotics -discussed following with GI -discussed the Book FibroFix -discussed getting a CBC with differentials, checking iron panel -discussed benefit from heat -called [facility] pharmacy to check on LDN script, will increased dose to 2mg  HS -prescribed vitamin  D -discussed that she smells gasoline all the time.  -discussed methylene blue -discussed red light therapy -discussed that valcyclovir seemed to help, change back to prn in case she is developing tolerance -Discussed benefits of exercise in reducing pain. -discussed mechanism of action of low dose naltrexone as an opioid receptor antagonist which stimulates your body's production of its own natural endogenous opioids, helping to decrease pain. Discussed that it can also decrease T cell response and thus be helpful in decreasing inflammation, and symptoms of brain fog, fatigue, anxiety, depression, and allergies. Discussed that this medication needs to be compounded at a compounding pharmacy and can more expensive. Discussed that I usually start at 1mg  and if this is not providing enough relief then I titrate upward on a monthly basis.    -Discussed following foods that may reduce pain: 1) Ginger (especially studied for arthritis)- reduce leukotriene production to decrease inflammation 2) Blueberries- high in phytonutrients that decrease inflammation 3) Salmon- marine omega-3s reduce joint swelling and pain 4) Pumpkin seeds- reduce inflammation 5) dark chocolate- reduces inflammation 6) turmeric- reduces inflammation 7) tart cherries - reduce pain and stiffness 8) extra virgin olive oil - its compound olecanthal helps to block prostaglandins  9) chili peppers- can be eaten or applied topically via capsaicin 10) mint- helpful for headache, muscle aches, joint pain, and itching 11) garlic- reduces inflammation  Link to further information on diet for chronic pain: [email or web address]   2) Fatigue -discussed her prior sleep studies -discussed red light therapy -continue modafinil -recommended sauna use, recommended hot tub use -discussed   3) IBS -discussed her food allergy testing and that she does not often eat  salmon, dairy, shrimp, cashews -discussed that new food allergy testing shows reactivity to pork, cheese,  wheat, casein -discussed that she has cut out gluten -discussed that this feels better -discussed that she feels the probiotic is helpful -recommended pendulum GLP-1  4) Chronic sinus infections  5) Suboptimal vitamin D: -sent Vitamin D ergocalciferol 50,000U once per week for 7 weeks.   6)Anxiety: -Discussed exercise and meditation as tools to decrease anxiety. -Recommended Down Dog Yoga app -Discussed spending time outdoors. -Discussed positive re-framing of anxiety.  -Discussed the following foods that have been show to reduce anxiety: 1) [place] nuts, mushrooms, soy beans due to their high selenium content. Upper limit of toxicity of selenium is 416mcg/day so no more than 3-4 [place] nuts per day.  2) Fatty fish such as salmon, mackerel, sardines, trout, and herring- high in omega-3 fatty acids 3) Eggs- increases serotonin and dopamine 4) Pumpkin seeds- high in omega-3 fatty acids 5) dark chocolate- high in flavanols that increase blood flow to brain 6) turmeric- take with black pepper to increase absorption 7) chamomile tea- antioxidant and anti-inflammatory properties 8) yogurt without sugar- supports gut-brain axis 9) green tea- contains L- theanine 10) blueberries- high in vitamin C and antioxidants 11) [place]- high in tryptophan which gets converted to serotonin 12) bell peppers- rich in vitamin C and antioxidants 13) citrus fruits- rich in vitamin C and antioxidants 14) almonds- high in vitamin E and healthy fats 15) chia seeds- high in omega-3 fatty acids   7) Mouth ulcers -continue acyclovir, change back to prn -she asks if this is an indication of viral load.  -trial lysine 500mg  to 3,000mg , discussed that herpes virus chokes on lysine  -Provided with a pain relief journal and discussed that it contains foods and lifestyle tips to naturally help to improve pain.  Discussed that these lifestyle strategies are also very good for health unlike some medications which can have negative side effects. Discussed that the act of keeping a journal can be therapeutic and helpful to realize patterns what helps to trigger and alleviate pain.    8) Insomnia:  -continue magnesium supplement and tart cherry juice.   9) Elevated LDL -reviewed LDL and the level is 107 Provided with list of supplements that can help with dyslipidemia: 1) Vitamin B3 500-4,000mg  in divided doses daily (would recommend starting low as can cause uncomfortable facial flushing if started at too high a dose) 2) Phytosterols 2.15 grams daily 3) Fermented soy 30-50 grams daily 4) EGCG (found in green tea): 500-1000mg  daily 5) Omega-3 fatty acids 3000-5,000mg  daily 6) Flax seed 40 grams daily 7) Monounsaturated fats 20-40 grams daily (olives, olive oil, nuts), also reduces cardiovascular disease 8) Sesame: 40 grams daily 9) Gamma/delta tocotrienols- a family of unsaturated forms of Vitamin E- 200mg  with dinner 10) Pantethine 900mg  daily in divided doses 11) Resveratrol 250mg  daily 12) N Acetyl Cysteine 2000mg  daily in divided doses 13) Curcumin 2000-5000mg  in divided doses daily 14) Pomegranate juice: 8 ounces daily, also helps to lower blood pressure 15) Pomegranate seeds one cup daily, also helps to lower blood pressure 16) Citrus Bergamot 1000mg  daily, also helps with glucose control and weight loss 17) Vitamin C 500mg  daily 18) Quercetin 500-1000mg  daily 19) Glutathione 20) Probiotics 60-100 billion organisms per day 21) Fiber 22) Oats 23) Aged garlic (can eat as food or supplement of 600-900mg  per day) 24) Chia seeds 25 grams per day 25) Lycopene- carotenoid found in high concentrations in tomatoes. 26) Alpha linolenic acid 27) Flavonoids and anthocyanins 28) Wogonin- flavanoid that enhances reverse cholesterol transport 29) Coenzyme Q10 30) Pantethine- derivative of  Vitamin B5:  300mg  three times per day or 450mg  twice per day with or without food 31) Barley and other whole grains 32) Orange juice 33) L- carnitine 34) L- Lysine 35) L- Arginine 36) Almonds 37) Morin 38) Rutin 39) Carnosine 40) Histidine  41) Kaempferol  42) Organosulfur compounds 43) Vitamin E 44) Oleic acid 45) RBO (ferulic acid gammaoryzanol) 46) grape seed extract 47) Red wine 48) Berberine HCL 500mg  daily or twice per day- more effective and with fewer adverse effects that ezetimibe monotherapy 49) red yeast rice 2400- 4800 mg/day 50) chlorella 51) Licorice   10) HTN:  -she asks whether this may be contributed to headache -continue valsartan and coreg  11) Anxiety:  -dicussed that she is doing well in this regard

## 2023-06-21 LAB — LIPID PANEL
Chol/HDL Ratio: 2.7 {ratio} (ref 0.0–4.4)
Cholesterol, Total: 220 mg/dL — ABNORMAL HIGH (ref 100–199)
HDL: 81 mg/dL (ref >39)
LDL Chol Calc (NIH): 130 mg/dL — ABNORMAL HIGH (ref 0–99)
Triglycerides: 49 mg/dL (ref 0–149)
VLDL Cholesterol Cal: 9 mg/dL (ref 5–40)

## 2023-06-22 NOTE — Progress Notes (Signed)
Cardiology Office Note:    Date:  06/22/2023   ID:  Herb Grays, DOB 09-14-1975, MRN [id number]  PCP:  Charlton Amor, DO  Cardiologist:  Thomasene Ripple, DO  Electrophysiologist:  None   Referring MD: Olive Bass,*     History of Present Illness:    Kelsey [name] is a 47 y.o. female with a symptomatic PVC, Hypertension, family hx of Long qtc hx of presents with concerns about a CT scan of her head performed a year and a half ago. She reports that her previous primary care physician did not express concern about the results, but her new primary care physician advised her to consult with neurology. The patient is currently awaiting an MRI with and without contrast as ordered by the neurologist.  Her last visit with me was over 1 year ago.  The patient also expresses concern about her cholesterol levels. The patient has been taking blood pressure medication twice daily, but reports that her blood pressure has been elevated in the last two months.   Lastly, the patient reports experiencing what she believes to be hypertension headaches, which start at the back of her head. She is not currently taking Topamax, a medication she was previously prescribed.  No other complaints at this time.   Past Medical History:  Diagnosis Date   Abnormal Pap smear    Anemia    Asthma 1996   Bipolar disorder (HCC)    Chronic sinusitis    GERD (gastroesophageal reflux disease) 2009   Headache(784.0)    Hypertension 2021   Pyelonephritis 2008   Pylo and stones in the past   Sinus problem    sinusitis   Thyroid disease 2017    Past Surgical History:  Procedure Laterality Date   CESAREAN SECTION     CESAREAN SECTION  08/31/2011   Procedure: CESAREAN SECTION;  Surgeon: Tresa Endo A. Ernestina Penna, MD;  Location: [facility];  Service: Gynecology;  Laterality: [name];  Repeat cesarean section with delivery of baby    COLPOSCOPY     KNEE ARTHROSCOPY     right knee   WISDOM TOOTH  EXTRACTION      Current Medications: Current Meds  Medication Sig   albuterol (VENTOLIN HFA) 108 (90 Base) MCG/ACT inhaler Inhale 2 puffs into the lungs as needed for wheezing or shortness of breath.   Ascorbic Acid (VITAMIN C PO) Take 1 tablet by mouth daily.   Betamethasone Valerate 0.12 % foam USE DAILY AS DIRECTED   buPROPion ER (WELLBUTRIN SR) 100 MG 12 hr tablet Take 100 mg by mouth 2 (two) times daily.   Cholecalciferol (VITAMIN D3 PO) Take 1 tablet by mouth daily.   cyanocobalamin (VITAMIN B12) 1000 MCG/ML injection Inject 1 mL (1,000 mcg total) into the muscle every 30 (thirty) days.   DULoxetine (CYMBALTA) 60 MG capsule Take 30 mg by mouth daily.   estradiol (CLIMARA) 0.05 mg/24hr patch Place 1 patch (0.05 mg total) onto the skin once a week.   ketoconazole (NIZORAL) 2 % shampoo APPLY 5-10ML TO WET SCALP. LATHER AND LEAVE ON FOR 3-5 MINUTES THEN RINSE. USE TWICE WEEKLY FOR 2-4 WEEKS   levonorgestrel (LILETTA, 52 MG,) 20.1 MCG/DAY IUD IUD 1 each by Intrauterine route once.   modafinil (PROVIGIL) 200 MG tablet Take 1 tablet (200 mg total) by mouth daily.   Omega-3 Fatty Acids (FISH OIL PO) Take 1 capsule by mouth daily.   Pyridoxine HCl (VITAMIN B-6 PO) Take 1 tablet by mouth daily.  Semaglutide-Weight Management 1 MG/0.5ML SOAJ Inject into the skin.   topiramate (TOPAMAX) 25 MG tablet Take 1 tablet (25 mg total) by mouth at bedtime.   triamcinolone (KENALOG) 0.025 % ointment Apply 1 Application topically 2 (two) times daily.   valsartan (DIOVAN) 80 MG tablet Take 1 tablet (80 mg total) by mouth 2 (two) times daily.   Vitamin D, Ergocalciferol, (DRISDOL) 1.25 MG (50000 UNIT) CAPS capsule Take 1 capsule (50,000 Units total) by mouth every 7 (seven) days.   [DISCONTINUED] atorvastatin (LIPITOR) 10 MG tablet Take 1 tablet (10 mg total) by mouth daily.   [DISCONTINUED] carvedilol (COREG) 12.5 MG tablet Take 1 tablet (12.5 mg total) by mouth 2 (two) times daily. KEEP APPOINTMENT ON WITH  DR. Aidee Latimore TO RECEIVE FURTHER REFILLS.   [DISCONTINUED] valACYclovir (VALTREX) 500 MG tablet Take 1 tablet (500 mg total) by mouth daily.   [DISCONTINUED] valsartan (DIOVAN) 40 MG tablet Take 1 tablet (40 mg total) by mouth 2 (two) times daily. KEEP APPOINTMENT ON WITH DR. Myda Detwiler TO RECEIVE FURTHER REFILLS.     Allergies:   Morphine, Morphine and codeine, Morphine sulfate, Other, Sulfa antibiotics, and Oseltamivir   Social History   Socioeconomic History   Marital status: Married    Spouse name: Not on file   Number of children: 2   Years of education: 77   Highest education level: Master's degree (e.g., MA, MS, MEng, MEd, MSW, MBA)  Occupational History   Occupation: clinical Hospital doctor: SELF-EMPLOYED  Tobacco Use   Smoking status: Never   Smokeless tobacco: Never  Vaping Use   Vaping status: Never Used  Substance and Sexual Activity   Alcohol use: No   Drug use: No   Sexual activity: Yes    Birth control/protection: I.U.D.  Other Topics Concern   Not on file  Social History Narrative   1 daughter--[name] born 12/09, 1 step son   Right handed   Works in social work   Occasionally caffeine   2 Administrator degree   Social Drivers of Corporate investment banker Strain: Low Risk  (06/11/2023)   Overall Financial Resource Strain (CARDIA)    Difficulty of Paying Living Expenses: Not very hard  Food Insecurity: No Food Insecurity (06/11/2023)   Hunger Vital Sign    Worried About Running Out of Food in the Last Year: Never true    Ran Out of Food in the Last Year: Never true  Transportation Needs: No Transportation Needs (06/11/2023)   PRAPARE - Administrator, Civil Service (Medical): No    Lack of Transportation (Non-Medical): No  Physical Activity: Insufficiently Active (06/11/2023)   Exercise Vital Sign    Days of Exercise per Week: 3 days    Minutes of Exercise per Session: 30 min  Stress: No Stress Concern Present (06/11/2023)    [employer] of Occupational Health - Occupational Stress Questionnaire    Feeling of Stress : Only a little  Social Connections: Socially Integrated (06/11/2023)   Social Connection and Isolation Panel [NHANES]    Frequency of Communication with Friends and Family: More than three times a week    Frequency of Social Gatherings with Friends and Family: More than three times a week    Attends Religious Services: More than 4 times per year    Active Member of [employer] or Organizations: Yes    Attends Banker Meetings: More than 4 times per year    Marital Status:  Married     Family History: The patient's family history includes Alcohol abuse in her paternal grandfather; Asthma in her brother; Cancer in her paternal grandfather and paternal grandmother; Coronary artery disease in her maternal grandfather; Depression in her mother; Diabetes in her maternal grandfather and mother; Early death in her brother; Heart disease in her brother and mother; Heart disease (age of onset: 24) in her maternal grandfather; Kidney disease in her maternal grandfather. There is no history of Stroke or Hypertension.  ROS:   Review of Systems  Constitution: Negative for decreased appetite, fever and weight gain.  HENT: Negative for congestion, ear discharge, hoarse voice and sore throat.   Eyes: Negative for discharge, redness, vision loss in right eye and visual halos.  Cardiovascular: Negative for chest pain, dyspnea on exertion, leg swelling, orthopnea and palpitations.  Respiratory: Negative for cough, hemoptysis, shortness of breath and snoring.   Endocrine: Negative for heat intolerance and polyphagia.  Hematologic/Lymphatic: Negative for bleeding problem. Does not bruise/bleed easily.  Skin: Negative for flushing, nail changes, rash and suspicious lesions.  Musculoskeletal: Negative for arthritis, joint pain, muscle cramps, myalgias, neck pain and stiffness.  Gastrointestinal: Negative for  abdominal pain, bowel incontinence, diarrhea and excessive appetite.  Genitourinary: Negative for decreased libido, genital sores and incomplete emptying.  Neurological: Negative for brief paralysis, focal weakness, headaches and loss of balance.  Psychiatric/Behavioral: Negative for altered mental status, depression and suicidal ideas.  Allergic/Immunologic: Negative for HIV exposure and persistent infections.    EKGs/Labs/Other Studies Reviewed:    The following studies were reviewed today:   EKG:  The ekg ordered today demonstrates   Recent Labs: 04/30/2023: ALT 13; BUN 7; Creatinine, Ser 0.76; Potassium 4.2; Sodium 138 05/14/2023: TSH 1.990 06/12/2023: Hemoglobin 13.5; Platelets 262  Recent Lipid Panel    Component Value Date/Time   CHOL 220 (H) 06/20/2023 1151   TRIG 49 06/20/2023 1151   HDL 81 06/20/2023 1151   CHOLHDL 2.7 06/20/2023 1151   CHOLHDL 3 06/19/2020 1303   VLDL 9.0 06/19/2020 1303   LDLCALC 130 (H) 06/20/2023 1151    Physical Exam:    VS:  BP (!) 128/90 (BP Location: Right Arm, Patient Position: Sitting)   Pulse 87   Ht 5\' 3"  (1.6 m)   Wt 138 lb (62.6 kg)   LMP  (LMP Unknown)   SpO2 98%   BMI 24.45 kg/m     Wt Readings from Last 3 Encounters:  06/20/23 135 lb (61.2 kg)  06/20/23 138 lb (62.6 kg)  06/19/23 140 lb (63.5 kg)     GEN: Well nourished, well developed in no acute distress HEENT: Normal NECK: No JVD; No carotid bruits LYMPHATICS: No lymphadenopathy CARDIAC: S1S2 noted,RRR, no murmurs, rubs, gallops RESPIRATORY:  Clear to auscultation without rales, wheezing or rhonchi  ABDOMEN: Soft, non-tender, non-distended, +bowel sounds, no guarding. EXTREMITIES: No edema, No cyanosis, no clubbing MUSCULOSKELETAL:  No deformity  SKIN: Warm and dry NEUROLOGIC:  Alert and oriented x 3, non-focal PSYCHIATRIC:  Normal affect, good insight  ASSESSMENT:    1. Essential hypertension   2. Hyperlipidemia, unspecified hyperlipidemia type   3. Atypical  chest pain    PLAN:    Hyperlipidemia Elevated cholesterol levels noted on previous labs. Discussed the need for pharmacological intervention. -Start low dose Lipitor. -Repeat labs today.  Hypertension Elevated blood pressure readings despite current regimen of Carvedilol and Valsartan. Discussed the need for regimen optimization. -Increase Valsartan to 80mg  twice daily. -Repeat labs in 12 weeks to assess response  to new regimen.  Atypical Chest Discomfort New onset of chest sensations. Discussed the possibility of GERD and the potential influence of uncontrolled hypertension. -Recommend evaluation and treatment for GERD by primary care physician. -Optimize blood pressure control and reassess symptoms at next visit.  Headaches New onset of headaches, described as possibly hypertension-related. Discussed the potential influence of uncontrolled hypertension. -Optimize blood pressure control and reassess symptoms at next visit.  Neurological Concerns Previous CT scan with concerning findings, currently awaiting MRI with and without contrast as ordered by neurologist. -Continue follow-up with neurology for further evaluation and management.   The patient is in agreement with the above plan. The patient left the office in stable condition.  The patient will follow up in   Medication Adjustments/Labs and Tests Ordered: Current medicines are reviewed at length with the patient today.  Concerns regarding medicines are outlined above.  Orders Placed This Encounter  Procedures   Lipid panel   EKG 12-Lead   Meds ordered this encounter  Medications   DISCONTD: atorvastatin (LIPITOR) 10 MG tablet    Sig: Take 1 tablet (10 mg total) by mouth daily.    Dispense:  90 tablet    Refill:  3   valsartan (DIOVAN) 80 MG tablet    Sig: Take 1 tablet (80 mg total) by mouth 2 (two) times daily.    Dispense:  180 tablet    Refill:  3   DISCONTD: atorvastatin (LIPITOR) 10 MG tablet    Sig: Take  1 tablet (10 mg total) by mouth at bedtime.    Dispense:  90 tablet    Refill:  3   DISCONTD: carvedilol (COREG) 12.5 MG tablet    Sig: Take 1 tablet (12.5 mg total) by mouth 2 (two) times daily.    Dispense:  30 tablet    Refill:  0   carvedilol (COREG) 12.5 MG tablet    Sig: Take 1 tablet (12.5 mg total) by mouth 2 (two) times daily.    Dispense:  180 tablet    Refill:  3   atorvastatin (LIPITOR) 10 MG tablet    Sig: Take 1 tablet (10 mg total) by mouth at bedtime.    Dispense:  30 tablet    Refill:  1    Patient Instructions  Medication Instructions:  Your physician has recommended you make the following change in your medication:  START: Lipitor 10 mg at night START: Valsartan 80 mg twice daily *If you need a refill on your cardiac medications before your next appointment, please call your pharmacy*   Lab Work: Lipids If you have labs (blood work) drawn today and your tests are completely normal, you will receive your results only by: MyChart Message (if you have MyChart) OR A paper copy in the mail If you have any lab test that is abnormal or we need to change your treatment, we will call you to review the results.  Follow-Up: At [facility], you and your health needs are our priority.  As part of our continuing mission to provide you with exceptional heart care, we have created designated Provider Care Teams.  These Care Teams include your primary Cardiologist (physician) and Advanced Practice Providers (APPs -  Physician Assistants and Nurse Practitioners) who all work together to provide you with the care you need, when you need it.   Your next appointment:   12 week(s)  Provider:   Thomasene Ripple, DO     Other Instructions You are invited to attend: Women's  Heart Community event on February Friday 7th 2025 at [facility] ([address] [place], [place], [place] [address]) from 8am-12pm. Feel free to invite other women to attend!  See you there!      Mediterranean Diet A Mediterranean diet is based on the traditions of countries on the [employer]. It focuses on eating more: Fruits and vegetables. Whole grains, beans, nuts, and seeds. Heart-healthy fats. These are fats that are good for your heart. It involves eating less: Dairy. Meat and eggs. Processed foods with added sugar, salt, and fat. This type of diet can help prevent certain conditions. It can also improve outcomes if you have a long-term (chronic) disease, such as kidney or heart disease. What are tips for following this plan? Reading food labels Check packaged foods for: The serving size. For foods such as rice and pasta, the serving size is the amount of cooked product, not dry. The total fat. Avoid foods with saturated fat or trans fat. Added sugars, such as corn syrup. Shopping  Try to have a balanced diet. Buy a variety of foods, such as: Fresh fruits and vegetables. You may be able to get these from local farmers markets. You can also buy them frozen. Grains, beans, nuts, and seeds. Some of these can be bought in bulk. Fresh seafood. Poultry and eggs. Low-fat dairy products. Buy whole ingredients instead of foods that have already been packaged. If you can't get fresh seafood, buy precooked frozen shrimp or canned fish, such as tuna, salmon, or sardines. Stock your pantry so you always have certain foods on hand, such as olive oil, canned tuna, canned tomatoes, rice, pasta, and beans. Cooking Cook foods with extra-virgin olive oil instead of using butter or other vegetable oils. Have meat as a side dish. Have vegetables or grains as your main dish. This means having meat in small portions or adding small amounts of meat to foods like pasta or stew. Use beans or vegetables instead of meat in common dishes like chili or lasagna. Try out different cooking methods. Try roasting, broiling, steaming, and sauting vegetables. Add frozen vegetables to soups,  stews, pasta, or rice. Add nuts or seeds for added healthy fats and plant protein at each meal. You can add these to yogurt, salads, or vegetable dishes. Marinate fish or vegetables using olive oil, lemon juice, garlic, and fresh herbs. Meal planning Plan to eat a vegetarian meal one day each week. Try to work up to two vegetarian meals, if possible. Eat seafood two or more times a week. Have healthy snacks on hand. These may include: Vegetable sticks with hummus. Greek yogurt. Fruit and nut trail mix. Eat balanced meals. These should include: Fruit: 2-3 servings a day. Vegetables: 4-5 servings a day. Low-fat dairy: 2 servings a day. Fish, poultry, or lean meat: 1 serving a day. Beans and legumes: 2 or more servings a week. Nuts and seeds: 1-2 servings a day. Whole grains: 6-8 servings a day. Extra-virgin olive oil: 3-4 servings a day. Limit red meat and sweets to just a few servings a month. Lifestyle  Try to cook and eat meals with your family. Drink enough fluid to keep your pee (urine) pale yellow. Be active every day. This includes: Aerobic exercise, which is exercise that causes your heart to beat faster. Examples include running and swimming. Leisure activities like gardening, walking, or housework. Get 7-8 hours of sleep each night. Drink red wine if your provider says you can. A glass of wine is 5 oz (  150 mL). You may be allowed to have: Up to 1 glass a day if you're female and not pregnant. Up to 2 glasses a day if you're female. What foods should I eat? Fruits Apples. Apricots. Avocado. Berries. Bananas. Cherries. Dates. Figs. Grapes. Lemons. Melon. Oranges. Peaches. Plums. Pomegranate. Vegetables Artichokes. Beets. Broccoli. Cabbage. Carrots. Eggplant. Green beans. Chard. Kale. Spinach. Onions. Leeks. Peas. Squash. Tomatoes. Peppers. Radishes. Grains Whole-grain pasta. Brown rice. Bulgur wheat. Polenta. Couscous. Whole-wheat bread. Orpah Cobb. Meats and other  proteins Beans. Almonds. Sunflower seeds. Pine nuts. Peanuts. Cod. Salmon. Scallops. Shrimp. Tuna. Tilapia. Clams. Oysters. Eggs. Chicken or [place] without skin. Dairy Low-fat milk. Cheese. Greek yogurt. Fats and oils Extra-virgin olive oil. Avocado oil. Grapeseed oil. Beverages Water. Red wine. Herbal tea. Sweets and desserts Greek yogurt with honey. Baked apples. Poached pears. Trail mix. Seasonings and condiments Basil. Cilantro. Coriander. Cumin. Mint. Parsley. Sage. Rosemary. Tarragon. Garlic. Oregano. Thyme. Pepper. Balsamic vinegar. Tahini. Hummus. Tomato sauce. Olives. Mushrooms. The items listed above may not be all the foods and drinks you can have. Talk to a dietitian to learn more. What foods should I limit? This is a list of foods that should be eaten rarely. Fruits Fruit canned in syrup. Vegetables Deep-fried potatoes, like [place] fries. Grains Packaged pasta or rice dishes. Cereal with added sugar. Snacks with added sugar. Meats and other proteins Beef. Pork. Lamb. Chicken or [place] with skin. Hot dogs. Tomasa Blase. Dairy Ice cream. Sour cream. Whole milk. Fats and oils Butter. Canola oil. Vegetable oil. Beef fat (tallow). Lard. Beverages Juice. Sugar-sweetened soft drinks. Beer. Liquor and spirits. Sweets and desserts Cookies. Cakes. Pies. Candy. Seasonings and condiments Mayonnaise. Pre-made sauces and marinades. The items listed above may not be all the foods and drinks you should limit. Talk to a dietitian to learn more. Where to find more information American Heart Association (AHA): heart.org This information is not intended to replace advice given to you by your health care provider. Make sure you discuss any questions you have with your health care provider. Document Revised: 10/06/2022 Document Reviewed: 10/06/2022 Elsevier Patient Education  2024 Elsevier Inc.  Adopting a Healthy Lifestyle.   Know what a healthy weight is for you (roughly BMI <25) and aim  to maintain this   Aim for 7+ servings of fruits and vegetables daily   65-80+ fluid ounces of water or unsweet tea for healthy kidneys   Limit to max 1 drink of alcohol per day; avoid smoking/tobacco   Limit animal fats in diet for cholesterol and heart health - choose grass fed whenever available   Avoid highly processed foods, and foods high in saturated/trans fats   Aim for low stress - take time to unwind and care for your mental health   Aim for 150 min of moderate intensity exercise weekly for heart health, and weights twice weekly for bone health   Aim for 7-9 hours of sleep daily   When it comes to diets, agreement about the perfect plan isnt easy to find, even among the experts. Experts at the [facility] of [employer] developed an idea known as the Healthy Eating Plate. Just imagine a plate divided into logical, healthy portions.   The emphasis is on diet quality:   Load up on vegetables and fruits - one-half of your plate: Aim for color and variety, and remember that potatoes dont count.   Go for whole grains - one-quarter of your plate: Whole wheat, barley, wheat berries, quinoa, oats, brown rice, and foods  made with them. If you want pasta, go with whole wheat pasta.   Protein power - one-quarter of your plate: Fish, chicken, beans, and nuts are all healthy, versatile protein sources. Limit red meat.   The diet, however, does go beyond the plate, offering a few other suggestions.   Use healthy plant oils, such as olive, canola, soy, corn, sunflower and peanut. Check the labels, and avoid partially hydrogenated oil, which have unhealthy trans fats.   If youre thirsty, drink water. Coffee and tea are good in moderation, but skip sugary drinks and limit milk and dairy products to one or two daily servings.   The type of carbohydrate in the diet is more important than the amount. Some sources of carbohydrates, such as vegetables, fruits, whole grains, and  beans-are healthier than others.   Adopting a Healthy Lifestyle.  Know what a healthy weight is for you (roughly BMI <25) and aim to maintain this   Aim for 7+ servings of fruits and vegetables daily   65-80+ fluid ounces of water or unsweet tea for healthy kidneys   Limit to max 1 drink of alcohol per day; avoid smoking/tobacco   Limit animal fats in diet for cholesterol and heart health - choose grass fed whenever available   Avoid highly processed foods, and foods high in saturated/trans fats   Aim for low stress - take time to unwind and care for your mental health   Aim for 150 min of moderate intensity exercise weekly for heart health, and weights twice weekly for bone health   Aim for 7-9 hours of sleep daily   When it comes to diets, agreement about the perfect plan isnt easy to find, even among the experts. Experts at the [facility] of [employer] developed an idea known as the Healthy Eating Plate. Just imagine a plate divided into logical, healthy portions.   The emphasis is on diet quality:   Load up on vegetables and fruits - one-half of your plate: Aim for color and variety, and remember that potatoes dont count.   Go for whole grains - one-quarter of your plate: Whole wheat, barley, wheat berries, quinoa, oats, brown rice, and foods made with them. If you want pasta, go with whole wheat pasta.   Protein power - one-quarter of your plate: Fish, chicken, beans, and nuts are all healthy, versatile protein sources. Limit red meat.   The diet, however, does go beyond the plate, offering a few other suggestions.   Use healthy plant oils, such as olive, canola, soy, corn, sunflower and peanut. Check the labels, and avoid partially hydrogenated oil, which have unhealthy trans fats.   If youre thirsty, drink water. Coffee and tea are good in moderation, but skip sugary drinks and limit milk and dairy products to one or two daily servings.   The type of  carbohydrate in the diet is more important than the amount. Some sources of carbohydrates, such as vegetables, fruits, whole grains, and beans-are healthier than others.   Finally, stay active  Signed, Thomasene Ripple, DO  06/22/2023 2:57 AM    [facility]

## 2023-06-29 ENCOUNTER — Encounter: Payer: Self-pay | Admitting: Physician Assistant

## 2023-06-29 ENCOUNTER — Ambulatory Visit (HOSPITAL_BASED_OUTPATIENT_CLINIC_OR_DEPARTMENT_OTHER)
Admission: RE | Admit: 2023-06-29 | Discharge: 2023-06-29 | Disposition: A | Discharge status: Home / Self Care | Payer: 59 | Source: Ambulatory Visit | Attending: Physician Assistant | Admitting: Physician Assistant

## 2023-06-29 DIAGNOSIS — R413 Other amnesia: Secondary | ICD-10-CM | POA: Insufficient documentation

## 2023-06-30 ENCOUNTER — Telehealth: Payer: Self-pay | Admitting: Physician Assistant

## 2023-06-30 ENCOUNTER — Ambulatory Visit: Payer: 59

## 2023-06-30 DIAGNOSIS — R590 Localized enlarged lymph nodes: Secondary | ICD-10-CM | POA: Diagnosis not present

## 2023-06-30 MED ORDER — IOHEXOL 300 MG/ML  SOLN
100.0000 mL | Freq: Once | INTRAMUSCULAR | Status: AC | PRN
  Administered 2023-06-30: 75 mL via INTRAVENOUS

## 2023-06-30 NOTE — Telephone Encounter (Signed)
Already sent message to Kelsey [name] pending.

## 2023-06-30 NOTE — Telephone Encounter (Signed)
Patient called to speak with sara or christy regarding the MRI she had yesterday. It was supposed to be MRI with and without contrast. The tech even said with and without contrast but once she went back to get it, it was only without. She would like to get this other done ASAP since its at the end of the year. She isn't sure where the confusion came from. 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

## 2023-07-22 ENCOUNTER — Other Ambulatory Visit: Payer: Self-pay

## 2023-07-22 DIAGNOSIS — Z79899 Other long term (current) drug therapy: Secondary | ICD-10-CM

## 2023-07-22 DIAGNOSIS — E785 Hyperlipidemia, unspecified: Secondary | ICD-10-CM

## 2023-07-22 NOTE — Progress Notes (Signed)
Lipid orders placed.

## 2023-07-25 ENCOUNTER — Ambulatory Visit (INDEPENDENT_AMBULATORY_CARE_PROVIDER_SITE_OTHER): Payer: 59 | Admitting: Physician Assistant

## 2023-07-25 ENCOUNTER — Encounter: Payer: Self-pay | Admitting: Physician Assistant

## 2023-07-25 VITALS — BP 126/83 | HR 72 | Resp 20 | Ht 63.0 in | Wt 139.0 lb

## 2023-07-25 DIAGNOSIS — R9082 White matter disease, unspecified: Secondary | ICD-10-CM | POA: Diagnosis not present

## 2023-07-25 DIAGNOSIS — R4020 Unspecified coma: Secondary | ICD-10-CM | POA: Diagnosis not present

## 2023-07-25 NOTE — Progress Notes (Signed)
Assessment/Plan:   Kelsey [name] is a very pleasant 48 y.o. 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  As recalled she had presented on 06/19/23 for evaluation of  "age advanced white matter disease" seen per CT head 2023. Patient continues to have similar symptoms as in the prior evaluation in December, not worsened.  We discussed performing a contrast MRI for further details, and in addition, to perform an MRI of the cervical spine and thoracic spine to rule out any demyelinating disease.  All of the images were explained to the patient and husband, and all of their questions were answered to their satisfaction.  Will follow-up once these images become available.  She also has a history of cervical lymphadenopathy being evaluated with CT of the head and neck and ENT, with negative results to date.   Recommendations  MRI brain with and without contrast to further evaluate abnormal findings-white matter disease MRI of the C-spine and thoracic spine to further evaluate any demyelinating disease  recommend good control of cardiovascular risk factors.   Follow-up in 2 weeks to discuss the results of the imaging above.    HPI  Due to headaches, in the past, the patient had a CT head which was remarkable for advanced age white matter disease without any acute issues. No further imaging was done at the time.  She then saw her PCP, who recommended further workup.  The patient reports that she never had a TIA or CVA.  She had 2 episodes of vertigo in 2016 and 2020  which sounds positional "room spinning with nausea, if lying down better" (chronic sinus infection).  Reports dizziness "from high to low "(orthostatic). Reports less acuity when reading "words may move" although no frank double vision or blindness. She reports having BP related  headaches similar to years ago, starting in the afternoon when BP is high, and "I hear my heart beating when that happens". She took Topamax in the past and was helping at the time, has not tried it recently but is about to do so.  Sometimes, she tries to say a word and cannot get a word out, and has and some fogginess".  She reports "I am thirsty all the time ", but denies any history of diabetes.. She reports that they have been times that gotten more difficult to determine "what is the next thing I need to do ". She reports a history of PTSD, on therapy and at times that has impacted her mood but recently resolved and feels better.  Denies seizures. 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 She reports "burning in the chest, being studied, unsure if it is cardiac or GERD".  She has any issues with constipation and diarrhea likely due to gluten allergy.  Occasional shortness of breath. Denies any fever or chills ,she has nightly night sweats for the last 2 months. "I am cold all the time" she had in the past with normal TSH, but in the distant past she was being evaluated for hypothyroidism. No tobacco. No new meds other than adding Wellbutrin as of yesterday by psychiatry.  She has increased  estradiol patch 1 month ago.   No history of PE or DVT. Does not take a regular ASA a day. Denies any recent long distance trips or recent surgeries. No sick contacts. No new stressors present in personal life Patient is compliant with his medications. Patient is very active, walking frequently and yoga.  I have always been clumsy, "tripping over things that would cause me to trip over".  No history of MS in the family.  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  Pertinent labs December 2024 TC 220, LDL 130, rest of the plan normal, HIV negative, normal CBC, TSH 1.999, ANA negative, CMP normal, CRP negative ESR normal at 2 Past Medical History:  Diagnosis Date   Abnormal Pap smear    Anemia    Asthma 1996   Bipolar disorder (HCC)    Chronic sinusitis    GERD (gastroesophageal reflux disease) 2009   Headache(784.0)    Hypertension 2021   Pyelonephritis 2008   Pylo and stones in the past   Sinus problem    sinusitis   Thyroid disease 2017     Past Surgical History:  Procedure Laterality Date   CESAREAN SECTION     CESAREAN SECTION  08/31/2011   Procedure: CESAREAN SECTION;  Surgeon: Tresa Endo A. Ernestina Penna, MD;  Location: [facility];  Service: Gynecology;  Laterality: [name];  Repeat cesarean section with delivery of baby    COLPOSCOPY     KNEE ARTHROSCOPY     right knee   WISDOM TOOTH EXTRACTION       Allergies  Allergen Reactions   Morphine Other (See Comments)   Morphine And Codeine Hives   Morphine Sulfate Other (See Comments)   Other Other (See Comments)   Sulfa Antibiotics Nausea And Vomiting   Oseltamivir Nausea And Vomiting and Other (See Comments)    Current Outpatient Medications  Medication Instructions   albuterol (VENTOLIN HFA) 108 (90 Base) MCG/ACT inhaler 2 puffs, As needed   Ascorbic Acid (VITAMIN C PO) 1 tablet, Daily   atorvastatin (LIPITOR) 10 mg, Oral, Nightly   Betamethasone Valerate 0.12 % foam USE DAILY AS DIRECTED   buPROPion ER (WELLBUTRIN SR) 100 mg, 2 times daily   carvedilol (COREG) 12.5 mg, Oral, 2 times daily   Cholecalciferol (VITAMIN D3 PO) 1 tablet, Daily   cyanocobalamin (VITAMIN B12) 1,000 mcg, Intramuscular, Every 30 days   DULoxetine (CYMBALTA) 30 mg, Daily   estradiol (CLIMARA) 0.05 mg, Transdermal, Weekly   ketoconazole (NIZORAL) 2 % shampoo APPLY 5-10ML TO WET SCALP. LATHER AND LEAVE ON FOR 3-5 MINUTES THEN RINSE. USE TWICE WEEKLY FOR 2-4 WEEKS    levonorgestrel (LILETTA, 52 MG,) 20.1 MCG/DAY IUD IUD 1 each,  Once   modafinil (PROVIGIL) 200 mg, Oral, Daily   Omega-3 Fatty Acids (FISH OIL PO) 1 capsule, Daily   Pyridoxine HCl (VITAMIN B-6 PO) 1 tablet, Daily   Semaglutide-Weight Management 1 MG/0.5ML SOAJ Inject into the skin.   topiramate (TOPAMAX) 25 mg, Oral, Nightly   triamcinolone (KENALOG) 0.025 % ointment 1 Application, Topical, 2 times daily   valACYclovir (VALTREX) 500 mg, Oral, Daily   valsartan (DIOVAN) 80 mg, Oral, 2 times daily   Vitamin D (Ergocalciferol) (DRISDOL) 50,000 Units, Oral, Every 7 days      Thank you for allowing [place] the opportunity to participate in the care of this nice patient. Please do not hesitate to contact us for any questions or concerns.   Total time spent on today's visit was 30 minutes dedicated  to this patient today, preparing to see patient, examining the patient, ordering tests and/or medications and counseling the patient, documenting clinical information in the EHR or other health record, independently interpreting results and communicating results to the patient/family, discussing treatment and goals, answering patient's questions and coordinating care.  Cc:  Charlton Amor, DO  [facility] 07/25/2023 5:59 AM

## 2023-07-25 NOTE — Patient Instructions (Addendum)
MRI brain with and without contrast [phone] MRI of the C and T spine  Follow up in 2 weeks

## 2023-07-29 ENCOUNTER — Encounter: Payer: Self-pay | Admitting: Physician Assistant

## 2023-07-29 ENCOUNTER — Telehealth: Payer: Self-pay | Admitting: Physician Assistant

## 2023-07-29 NOTE — Telephone Encounter (Signed)
Pt called to see if Kelsey [name]/Kelsey [name] can send the 3 MRI's that is ordered for her to [facility]. GI is booked out until feb or longer. She had her last MRI in [place]. She wants her orders changed so she can go to any [place] facility for these. She wants a call back as well to discuss this. She is supposed to see Kelsey [name] back in 2 weeks and would like to get this done asap

## 2023-07-29 NOTE — Telephone Encounter (Signed)
Looking in to this currently now

## 2023-08-04 ENCOUNTER — Ambulatory Visit
Admission: RE | Admit: 2023-08-04 | Discharge: 2023-08-04 | Disposition: A | Discharge status: Home / Self Care | Payer: 59 | Source: Ambulatory Visit | Attending: Physician Assistant | Admitting: Physician Assistant

## 2023-08-04 MED ORDER — GADOPICLENOL 0.5 MMOL/ML IV SOLN
6.0000 mL | Freq: Once | INTRAVENOUS | Status: AC | PRN
  Administered 2023-08-04: 6 mL via INTRAVENOUS

## 2023-08-07 ENCOUNTER — Ambulatory Visit (INDEPENDENT_AMBULATORY_CARE_PROVIDER_SITE_OTHER): Payer: 59 | Admitting: Family Medicine

## 2023-08-07 VITALS — BP 131/87 | HR 111 | Ht 63.0 in | Wt 138.3 lb

## 2023-08-07 DIAGNOSIS — J019 Acute sinusitis, unspecified: Secondary | ICD-10-CM

## 2023-08-07 DIAGNOSIS — B379 Candidiasis, unspecified: Secondary | ICD-10-CM

## 2023-08-07 DIAGNOSIS — B9689 Other specified bacterial agents as the cause of diseases classified elsewhere: Secondary | ICD-10-CM

## 2023-08-07 DIAGNOSIS — J069 Acute upper respiratory infection, unspecified: Secondary | ICD-10-CM | POA: Diagnosis not present

## 2023-08-07 DIAGNOSIS — R051 Acute cough: Secondary | ICD-10-CM | POA: Diagnosis not present

## 2023-08-07 DIAGNOSIS — T3695XA Adverse effect of unspecified systemic antibiotic, initial encounter: Secondary | ICD-10-CM

## 2023-08-07 MED ORDER — FLUCONAZOLE 150 MG PO TABS: 150.0000 mg | ORAL_TABLET | Freq: Once | ORAL | 0 refills | Status: AC

## 2023-08-07 MED ORDER — DOXYCYCLINE HYCLATE 100 MG PO TABS: 100.0000 mg | ORAL_TABLET | Freq: Two times a day (BID) | ORAL | 0 refills | Status: DC

## 2023-08-07 MED ORDER — HYDROCOD POLI-CHLORPHE POLI ER 10-8 MG/5ML PO SUER: 5.0000 mL | Freq: Two times a day (BID) | ORAL | 0 refills | Status: DC | PRN

## 2023-08-07 NOTE — Patient Instructions (Signed)
Increase rest, hydration, vit c,d, zinc

## 2023-08-07 NOTE — Progress Notes (Signed)
Acute Office Visit  Subjective:     Patient ID: Kelsey [name], female    DOB: 1975/08/11, 48 y.o.   MRN: [id number]  Chief Complaint  Patient presents with   Cough    Sore throat, cough, congestion x3days    Cough Associated symptoms include a fever. Pertinent negatives include no chest pain, chills, headaches or shortness of breath.   Patient is in today for congestion, cough, and sinus pain/pressure for 3 days. Does admit to body aches and fever of 101F.   Review of Systems  Constitutional:  Positive for fever. Negative for chills.  HENT:  Positive for congestion and sinus pain.   Respiratory:  Positive for cough. Negative for shortness of breath.   Cardiovascular:  Negative for chest pain.  Neurological:  Negative for headaches.        Objective:    BP 131/87 (BP Location: Left Arm, Patient Position: Sitting, Cuff Size: Large)   Pulse (!) 111   Ht 5\' 3"  (1.6 m)   Wt 138 lb 5 oz (62.7 kg)   SpO2 98%   BMI 24.50 kg/m    Physical Exam Vitals and nursing note reviewed.  Constitutional:      General: She is not in acute distress.    Appearance: Normal appearance.  HENT:     Head: Normocephalic and atraumatic.     Comments: Tenderness to palpation of maxillary sinus b/l    Right Ear: Ear canal and external ear normal.     Left Ear: Ear canal and external ear normal.     Ears:     Comments: Fluid on ears b/l    Nose: Nose normal.  Eyes:     Conjunctiva/sclera: Conjunctivae normal.  Cardiovascular:     Rate and Rhythm: Normal rate and regular rhythm.  Pulmonary:     Effort: Pulmonary effort is normal.     Breath sounds: Normal breath sounds.  Neurological:     General: No focal deficit present.     Mental Status: She is alert and oriented to person, place, and time.  Psychiatric:        Mood and Affect: Mood normal.        Behavior: Behavior normal.        Thought Content: Thought content normal.        Judgment: Judgment normal.     No  results found for any visits on 08/07/23.      Assessment & Plan:   Problem List Items Addressed This Visit       Respiratory   Acute bacterial sinusitis - Primary   Pt has sinus pain and pressure along with fevers and cough will go ahead and treat for sinus infection with doxycycline that will help prevent against any lung pathology. Have given tussionex prn for cough. Pmp reviewed and verified with no red flags.       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 Recommend supportive care including fluids, vit c, d and zinc      Relevant Medications   fluconazole (DIFLUCAN) 150 MG tablet   Other Visit Diagnoses       Acute cough  Antibiotic-induced yeast infection       Relevant Medications   fluconazole (DIFLUCAN) 150 MG tablet       Meds ordered this encounter  Medications   doxycycline (VIBRA-TABS) 100 MG tablet    Sig: Take 1 tablet (100 mg total) by mouth 2 (two) times daily for 7 days.    Dispense:  14 tablet    Refill:  0   chlorpheniramine-HYDROcodone (TUSSIONEX) 10-8 MG/5ML    Sig: Take 5 mLs by mouth every 12 (twelve) hours as needed for cough (cough, will cause drowsiness.).    Dispense:  120 mL    Refill:  0   fluconazole (DIFLUCAN) 150 MG tablet    Sig: Take 1 tablet (150 mg total) by mouth once for 1 dose. If no better in 72 hours take second dose    Dispense:  2 tablet    Refill:  0    Return if symptoms worsen or fail to improve.  Charlton Amor, DO

## 2023-08-07 NOTE — Assessment & Plan Note (Signed)
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 Recommend supportive care including fluids, vit c, d and zinc

## 2023-08-07 NOTE — Assessment & Plan Note (Signed)
Pt has sinus pain and pressure along with fevers and cough will go ahead and treat for sinus infection with doxycycline that will help prevent against any lung pathology. Have given tussionex prn for cough. Pmp reviewed and verified with no red flags.

## 2023-08-08 ENCOUNTER — Other Ambulatory Visit: Payer: 59

## 2023-08-08 ENCOUNTER — Ambulatory Visit
Admission: RE | Admit: 2023-08-08 | Discharge: 2023-08-08 | Disposition: A | Discharge status: Home / Self Care | Payer: 59 | Source: Ambulatory Visit | Attending: Physician Assistant | Admitting: Physician Assistant

## 2023-08-08 ENCOUNTER — Encounter (INDEPENDENT_AMBULATORY_CARE_PROVIDER_SITE_OTHER): Payer: 59 | Admitting: Family Medicine

## 2023-08-08 DIAGNOSIS — J45909 Unspecified asthma, uncomplicated: Secondary | ICD-10-CM

## 2023-08-08 MED ORDER — GADOPICLENOL 0.5 MMOL/ML IV SOLN
6.0000 mL | Freq: Once | INTRAVENOUS | Status: AC | PRN
  Administered 2023-08-08: 6 mL via INTRAVENOUS

## 2023-08-10 ENCOUNTER — Other Ambulatory Visit: Payer: Self-pay | Admitting: Family Medicine

## 2023-08-10 MED ORDER — AMOXICILLIN-POT CLAVULANATE 875-125 MG PO TABS: 1.0000 | ORAL_TABLET | Freq: Two times a day (BID) | ORAL | 0 refills | Status: AC

## 2023-08-12 ENCOUNTER — Ambulatory Visit (INDEPENDENT_AMBULATORY_CARE_PROVIDER_SITE_OTHER): Payer: 59 | Admitting: Physician Assistant

## 2023-08-12 ENCOUNTER — Encounter: Payer: Self-pay | Admitting: Physician Assistant

## 2023-08-12 VITALS — BP 136/90 | Resp 18 | Ht 63.0 in | Wt 138.0 lb

## 2023-08-12 DIAGNOSIS — R9082 White matter disease, unspecified: Secondary | ICD-10-CM

## 2023-08-12 DIAGNOSIS — I776 Arteritis, unspecified: Secondary | ICD-10-CM

## 2023-08-12 NOTE — Progress Notes (Addendum)
 Assessment/Plan:   Kelsey [name] is a very pleasant 48 y.o. 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  MRI of the brain with and without contrast to evaluate the above, personally reviewed 08/11/2023 without focal contrast-enhancing lesion visualized, possible small meningioma.  On this she spine and T-spine there was no evidence of the knee any negative disease.   All of the images were explained to the patient and husband, and all of their questions were answered to their satisfaction.  She also has a history of cervical lymphadenopathy being evaluated with CT of the head and neck and ENT, with negative results to date. In today's visit patient is recovering from Flu. She had an episode of light headedness, with some improvement during the visit . She takes semiglutide injections for weight loss, likely contributing to today's symptoms. She was instructed to contact the prescribing physician to determine if this medicine is needed at same dose and frequency. Hydration was recommended.   Recommendations Follow up in 6 months  Repeat MRI of the brain in 6 months to follow-up on possible meningioma  Labs: Sjogren's, ANCA, ANA,ESR, CRP, SPEP  LP for CNS vasculitis   Will call patient with results when available       HPI  Due to headaches, in the past, the patient had a CT head which was remarkable for advanced age white matter disease without  any acute issues. No further imaging was done at the time.  She then saw her PCP, who recommended further workup.  The patient reports that she never had a TIA or CVA.  She had 2 episodes of vertigo in 2016 and 2020 which sounds positional room spinning with nausea, if lying down better (chronic sinus infection).  Reports dizziness from high to low (orthostatic). Reports less acuity when reading words may move although no frank double vision or blindness. She reports having BP related  headaches similar to years ago, starting in the afternoon when BP is high, and I hear my heart beating when that happens. She took Topamax  in the past and was helping at the time, has not tried it recently but is about to do so.  Sometimes, she tries to say a word and cannot get a word out, and has and some fogginess.  She reports I am thirsty all the time , but denies any history of diabetes.. She reports that they have been times that gotten more difficult to determine what is the next thing I need to do . She reports a history of PTSD, on therapy and at times that has impacted her mood but recently resolved and feels better.  Denies seizures. 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 She reports burning in the chest, being studied, unsure if it is cardiac or GERD.  She has any issues with constipation and diarrhea likely due to gluten allergy .  Occasional shortness of breath. Denies any fever or chills ,she has nightly night sweats for the last 2 months. I am cold  all the time she had in the past with normal TSH, but in the distant past she was being evaluated for hypothyroidism. No tobacco. No new meds other than adding Wellbutrin  as of yesterday by psychiatry.  She has increased estradiol  patch 1 month ago.   No history of PE or DVT. Does not take a regular ASA a day. Denies any recent long distance trips or recent surgeries. No sick contacts. No new stressors present in personal life Patient is compliant  with his medications. Patient is very active, walking frequently and yoga.  I have always been clumsy, tripping over things that would cause me to trip over.  No history of MS in the family.  She works independently as a chartered loss adjuster  Pertinent labs December 2024 TC 220, LDL 130, rest of the plan normal, HIV negative, normal CBC, TSH 1.999, ANA negative, CMP normal, CRP negative ESR normal at 2 Past Medical History:  Diagnosis Date   Abnormal Pap smear    Anemia    Asthma 1996   Bipolar disorder (HCC)    Chronic sinusitis    GERD (gastroesophageal reflux disease) 2009   Headache(784.0)    Hypertension 2021   Pyelonephritis 2008   Pylo and stones in the past   Sinus problem    sinusitis   Thyroid  disease 2017     Past Surgical History:  Procedure Laterality Date   CESAREAN SECTION     CESAREAN SECTION  08/31/2011   Procedure: CESAREAN SECTION;  Surgeon: Burnard A. Kandyce, MD;  Location: [facility];  Service: Gynecology;  Laterality: [name];  Repeat cesarean section with delivery of baby    COLPOSCOPY     KNEE ARTHROSCOPY     right knee   WISDOM TOOTH EXTRACTION       Allergies  Allergen Reactions   Doxycycline  Nausea And Vomiting   Morphine  Other (See Comments)   Morphine  And Codeine Hives   Morphine  Sulfate Other (See Comments)   Other Other (See Comments)   Sulfa Antibiotics Nausea And Vomiting   Oseltamivir  Nausea And Vomiting and Other (See Comments)    Current Outpatient Medications  Medication Instructions   albuterol  (VENTOLIN  HFA) 108 (90 Base) MCG/ACT inhaler 2 puffs, As needed   amoxicillin -clavulanate (AUGMENTIN ) 875-125 MG tablet 1 tablet, Oral, 2 times daily   Ascorbic Acid (VITAMIN C PO) 1 tablet, Daily   Betamethasone  Valerate 0.12 % foam USE DAILY AS DIRECTED   buPROPion  ER (WELLBUTRIN  SR) 100 mg, Daily   carvedilol  (COREG ) 12.5 mg, Oral, 2 times daily   chlorpheniramine-HYDROcodone  (TUSSIONEX) 10-8 MG/5ML 5 mLs, Oral, Every 12 hours PRN    Cholecalciferol (VITAMIN D3 PO) 1 tablet, Daily   cyanocobalamin  (VITAMIN B12) 1,000 mcg, Intramuscular, Every 30 days   DULoxetine (CYMBALTA) 30 mg, Daily   estradiol  (CLIMARA ) 0.05 mg, Transdermal, Weekly   ketoconazole (NIZORAL) 2 % shampoo APPLY 5-10ML TO WET SCALP. LATHER AND LEAVE ON FOR 3-5 MINUTES THEN RINSE. USE TWICE WEEKLY FOR 2-4 WEEKS   levonorgestrel  (LILETTA , 52 MG,) 20.1 MCG/DAY IUD IUD 1 each,  Once   modafinil  (PROVIGIL ) 200 mg, Oral, Daily   Omega-3 Fatty Acids (FISH OIL PO) 1 capsule, Daily   Pyridoxine HCl (VITAMIN B-6 PO) 1 tablet, Daily   Semaglutide-Weight Management 1 MG/0.5ML SOAJ Inject into the skin.   topiramate  (TOPAMAX ) 25 mg, Oral, Nightly   triamcinolone  (KENALOG ) 0.025 % ointment 1 Application, Topical, 2 times daily   valACYclovir  (VALTREX ) 500 mg, Oral, Daily   valsartan  (DIOVAN ) 80  mg, Oral, 2 times daily   Vitamin D  (Ergocalciferol ) (DRISDOL ) 50,000 Units, Oral, Every 7 days      Thank you for allowing us  the opportunity to participate in the care of this nice patient. Please do not hesitate to contact us  for any questions or concerns.   Total time spent on today's visit was 38 minutes dedicated to this patient today, preparing to see patient, examining the patient, ordering tests and/or medications and counseling the patient, documenting clinical information in the EHR or other health record, independently interpreting results and communicating results to the patient/family, discussing treatment and goals, answering patient's questions and coordinating care.  Cc:  Bevin Bernice RAMAN, DO  [facility] 08/12/2023 6:10 AM

## 2023-08-12 NOTE — Patient Instructions (Signed)
Labs today [address] Lumbar spinal tap [facility] [phone]

## 2023-08-13 ENCOUNTER — Other Ambulatory Visit: Payer: 59

## 2023-08-15 ENCOUNTER — Other Ambulatory Visit: Payer: 59

## 2023-08-15 NOTE — Progress Notes (Signed)
 Outpatient Cardiology Consultation   HPI    History of Present Illness:  Kelsey [name] is a 48 y.o. female   History of Present Illness The patient is a 48 year old female presenting to establish care. Her prior cardiology note was reviewed. She has been following with cardiology for symptomatic PVCs, hypertension, and a family history of long QT syndrome. She also has hyperlipidemia. A low-dose statin was recommended by her cardiologist in December. Her blood pressure was elevated, and valsartan  was increased to 80 mg twice daily. An echocardiogram from 2021 demonstrates normal left ventricular systolic function, normal diastolic function, normal right ventricular function, and no significant valvular lesions.  She has been under the care of a cardiologist for the past 4 years but has recently expressed dissatisfaction. She reports an increase in her cholesterol levels without a clear understanding of the cause. She was previously on Lipitor for a month but discontinued it due to a fainting episode, increased headaches, and itching. She maintains a healthy diet, avoiding junk food, processed foods, red meat, eggs, fatty dairy. She consumes almond milk and a lot of fruit due to gluten intolerance. She eats out once every other week.  Over the past few years, her blood pressure has fluctuated.  She was diagnosed with hypertension in September 2021, following a COVID-19 infection in July 2021, and was referred to cardiology. She is currently on carvedilol  and valsartan , prescribed twice daily, but often forgets the evening dose. She monitors her blood pressure at home, which typically reads around 140/90. She has been experiencing fatigue and pain for the past few years, which have worsened over the last 6 to 8 months. She has been taking modafinil  to manage these symptoms and recently switched to Wellbutrin  100 mg. She reports feeling like a shell of her former self, unable to function as  she used to. She has a history of being active in sports and exercise but has noticed a decline in her health since contracting COVID-19. She continues to experience palpitations, although less frequently than during her Zio patch monitoring, occurring a few times a week. She also reports fatigue, lightheadedness, generalized pain, and constant headaches, which are unresponsive to NSAIDs and Topamax . Regular acupuncture provides temporary relief for about 24 hours. She has noticed swelling and cramping in her calves over the past 2 months, which she suspects may be due to a vitamin deficiency. She has been on and off beta blockers since she was 16 due to a long QT diagnosis. She has been seeing a neurologist since November or December due to white matter found on a CT scan about a year and a half ago. They were initially looking at the possibility of MS, but now they are considering vasculitis, so they are doing additional testing. She has to go for blood tests today and will be having a lumbar puncture in about a week and a half.  She was evaluated at [facility] by Dr. Dodie who is an expert and long QT syndrome.  Conclusion of the workup was that she does not have congenital long QT syndrome.  SOCIAL HISTORY - Does not smoke - Does not drink alcohol  FAMILY HISTORY - Maternal grandfather died of a heart attack - Mother is a carrier of long QT syndrome - Older brother passed away at 7 years old likely due to long QT syndrome and arrhythmia - No family history of cardiac disease on father's side - Family history of cancer on father's side  ALLERGIES -  Gluten intolerant  MEDICATIONS - Current: Carvedilol , valsartan , Wellbutrin  - Discontinued: Lipitor, atenolol , nadolol    Exam     Review of Systems  Constitutional:  Positive for fatigue. Negative for chills.  Respiratory:  Negative for chest tightness and shortness of breath.   Cardiovascular:  Positive for palpitations. Negative for  chest pain.  Neurological:  Negative for dizziness and syncope.    Physical Exam Constitutional:      General: She is not in acute distress. HENT:     Head: Normocephalic and atraumatic.  Cardiovascular:     Rate and Rhythm: Normal rate and regular rhythm.     Pulses: Normal pulses.     Heart sounds: No murmur heard. Musculoskeletal:     Right lower leg: No edema.     Left lower leg: No edema.  Pulmonary:     Effort: Pulmonary effort is normal.     Breath sounds: No wheezing.  Skin:    General: Skin is warm and dry.  Neurological:     General: No focal deficit present.     Mental Status: She is alert and oriented to person, place, and time.  Psychiatric:        Mood and Affect: Mood normal.        Behavior: Behavior normal.          Assessment/Plan   I have personally reviewed the patients active medications.  I have reviewed prior cardiology note from [facility] clinic, neurology note(s) Prior studies independently reviewed and interpreted by me:  EKG from 2023: Study is not available for my independent review however the report indicates sinus rhythm with a rate of 80 QT interval 398.  Assessment:  I have personally reviewed the following results:  Results - Laboratory Studies:   - LDL cholesterol: 130   - HDL and triglycerides: normal  - Imaging:   - Echocardiogram (2021): normal left ventricular systolic function, normal diastolic function, normal right ventricular function, no significant valvular lesions  - Testing:   - EKG today: normal    Assessment & Plan 1. Hypertension: BP normal today.  Reports elevation on home checks. -She is not adherent to her carvedilol  due to twice daily dosing.  It does not seem like there is a strong indication for beta-blocker therapy.  She does have elevated resting heart rate that has been attributed to COVID infection. -Will try to taper off carvedilol -instructions provided. -Continue valsartan  at current dosing -Will  reevaluate therapy after 2 weeks of BP checks.  Return log to me at that time.  2. Hyperlipidemia. -Continue ASCVD risk is 0.7% which is low.  No strong indication for statin therapy at this time. - Maintain a healthy diet and engage in regular physical activity. - Lipid panel re-evaluation in 3 to 6 months.   Follow-up - Follow up in 4 months.     Results  ECG: No results found.  Echo: No results found.    Electronically Signed: Redell DELENA Montenegro, MD 08/15/2023 7:04 AM

## 2023-08-20 LAB — PROTEIN ELECTROPHORESIS, SERUM
Albumin ELP: 4.3 g/dL (ref 3.8–4.8)
Alpha 1: 0.3 g/dL (ref 0.2–0.3)
Alpha 2: 0.7 g/dL (ref 0.5–0.9)
Beta 2: 0.4 g/dL (ref 0.2–0.5)
Beta Globulin: 0.4 g/dL (ref 0.4–0.6)
Gamma Globulin: 0.9 g/dL (ref 0.8–1.7)
Total Protein: 7 g/dL (ref 6.1–8.1)

## 2023-08-20 LAB — ANA: Anti Nuclear Antibody (ANA): NEGATIVE

## 2023-08-20 LAB — ANCA SCREEN W REFLEX TITER: ANCA SCREEN: NEGATIVE

## 2023-08-20 LAB — SJOGREN'S SYNDROME ANTIBODS(SSA + SSB)
SSA (Ro) (ENA) Antibody, IgG: <1 AI
SSB (La) (ENA) Antibody, IgG: <1 AI

## 2023-08-20 LAB — C-REACTIVE PROTEIN: CRP: <3 mg/L (ref <8.0)

## 2023-08-20 LAB — SEDIMENTATION RATE: Sed Rate: 6 mm/h (ref 0–20)

## 2023-08-26 NOTE — Discharge Instructions (Signed)

## 2023-08-27 ENCOUNTER — Other Ambulatory Visit (HOSPITAL_COMMUNITY)
Admission: RE | Admit: 2023-08-27 | Discharge: 2023-08-27 | Disposition: A | Discharge status: Home / Self Care | Payer: 59 | Source: Ambulatory Visit | Attending: Physician Assistant | Admitting: Physician Assistant

## 2023-08-27 ENCOUNTER — Ambulatory Visit
Admission: RE | Admit: 2023-08-27 | Discharge: 2023-08-27 | Disposition: A | Discharge status: Home / Self Care | Payer: 59 | Source: Ambulatory Visit | Attending: Physician Assistant | Admitting: Physician Assistant

## 2023-08-27 VITALS — BP 131/92 | HR 98

## 2023-08-27 DIAGNOSIS — R5383 Other fatigue: Secondary | ICD-10-CM | POA: Insufficient documentation

## 2023-08-27 DIAGNOSIS — R9082 White matter disease, unspecified: Secondary | ICD-10-CM | POA: Insufficient documentation

## 2023-08-27 DIAGNOSIS — R42 Dizziness and giddiness: Secondary | ICD-10-CM

## 2023-08-27 NOTE — Progress Notes (Signed)
1 vial of blood drawn from pts RAC to be sent off with LP lab work. 1 successful attempt, pt tolerated well. Gauze and tape applied after.  

## 2023-08-28 LAB — CYTOLOGY - NON PAP

## 2023-09-01 MED ORDER — BUDESONIDE-FORMOTEROL FUMARATE 160-4.5 MCG/ACT IN AERO: 2.0000 | INHALATION_SPRAY | Freq: Two times a day (BID) | RESPIRATORY_TRACT | 3 refills | Status: AC

## 2023-09-01 MED ORDER — HYDROCOD POLI-CHLORPHE POLI ER 10-8 MG/5ML PO SUER: 5.0000 mL | Freq: Two times a day (BID) | ORAL | 0 refills | Status: DC | PRN

## 2023-09-01 NOTE — Telephone Encounter (Signed)

## 2023-09-11 ENCOUNTER — Encounter: Payer: Self-pay | Admitting: Physician Assistant

## 2023-09-12 ENCOUNTER — Encounter: Payer: 59 | Attending: Physical Medicine and Rehabilitation | Admitting: Physical Medicine and Rehabilitation

## 2023-09-12 ENCOUNTER — Ambulatory Visit: Payer: 59 | Admitting: Cardiology

## 2023-09-12 ENCOUNTER — Encounter: Payer: Self-pay | Admitting: Physical Medicine and Rehabilitation

## 2023-09-12 ENCOUNTER — Other Ambulatory Visit: Payer: Self-pay | Admitting: Physical Medicine and Rehabilitation

## 2023-09-12 VITALS — BP 112/75 | HR 103 | Ht 63.0 in | Wt 142.0 lb

## 2023-09-12 DIAGNOSIS — R5383 Other fatigue: Secondary | ICD-10-CM | POA: Diagnosis not present

## 2023-09-12 DIAGNOSIS — I10 Essential (primary) hypertension: Secondary | ICD-10-CM | POA: Insufficient documentation

## 2023-09-12 DIAGNOSIS — Z8679 Personal history of other diseases of the circulatory system: Secondary | ICD-10-CM | POA: Diagnosis present

## 2023-09-12 DIAGNOSIS — M792 Neuralgia and neuritis, unspecified: Secondary | ICD-10-CM | POA: Diagnosis not present

## 2023-09-12 MED ORDER — TOPIRAMATE 25 MG PO TABS: 25.0000 mg | ORAL_TABLET | Freq: Two times a day (BID) | ORAL | 3 refills | Status: DC

## 2023-09-12 MED ORDER — VALACYCLOVIR HCL 500 MG PO TABS: 500.0000 mg | ORAL_TABLET | Freq: Every day | ORAL | 3 refills | Status: DC | PRN

## 2023-09-12 MED ORDER — MODAFINIL 200 MG PO TABS: 200.0000 mg | ORAL_TABLET | Freq: Every day | ORAL | 3 refills | Status: DC

## 2023-09-12 NOTE — Patient Instructions (Signed)
 Turmeric with black pepper Oregano Ginger Spirulina Cilantro  Wild berries Fruits and vegetables Celery juice oz   Sauna  Stress Exercise  B12, methylated Zinc   Cat's claw Olive leaf

## 2023-09-12 NOTE — Progress Notes (Signed)
 Subjective:    Patient ID: Kelsey [name], female    DOB: [birth year], 48 y.o.   MRN: [id number]  HPI Kelsey [name] is a 48 year old woman who presents for f/u of chronic pain, anxiety.  1) Chronic pain -pain has been well controlled with some flucuations -pain has increased, would be interested in increased low dose naltrexone -taking magnesium and tart cherry juice -she can tell when she does not take the magnesium and potassium -pain has been better with naltrexone and cymbalta -tested positive for EBV and some of the banners of Lyme Disease -she was seeing an integrative medicine center -she tested positive for RA -she saw a rheumatologist and had Xrs done -pains started in 2017 and feels like a burning sensation -has not yet heard from [facility] regarding her LDN -willing to take additional vitamin D supplement  2) Fatigue -this has been the big thing -her PCP recommends wellbutrin -she has been to two different sleep clinics -she went to the Sleep center in [place] and both were negative -she asks about going up to 200mg  daily -sleep has not worsened since starting modafinil  3) Anxiety: -has been having stressors after her divorce with her ex-husband trying to gain more custody of her daughter. -she has seen a psychiatrist to deal with this. -she switched her Celexa to Cymbalta -when she had a break from her husband and was able to get off the medication and feel fine -started seeing a new psychiatrist in [place] and suggested trying the Cymbalta -she has been working on [place] therapy and grounding  -has been seeing a psychiatrist Dr. Jacob [name] who has been talking about how viral load affects mitochondira  4) Insomnia: -magnesium glycinate helps  5) History of hypertensive headache: -had a CT 2 years ago and was found to have white matter lesions -Neurology did MRIs recently to assess for MS -she was found to have multiple white matter  lesions -there was acute and chronic inflammation in the spinal fluid     Pain Inventory Average Pain 4 Pain Right Now 4 My pain is intermittent, burning, dull, and aching  In the last 24 hours, has pain interfered with the following? General activity 0 Relation with others 0 Enjoyment of life 0 What TIME of day is your pain at its worst? evening and night Sleep (in general) Fair  Pain is worse with: sitting and cold , stress Pain improves with: heat/ice and injections(acupuncture) Relief from Meds: 6      Family History  Problem Relation Age of Onset   Heart disease Brother        sudden cardiac death   Asthma Brother    Early death Brother    Heart disease Maternal Grandfather 53       MI   Coronary artery disease Maternal Grandfather    Diabetes Maternal Grandfather    Kidney disease Maternal Grandfather    Heart disease Mother    Depression Mother    Diabetes Mother    Alcohol abuse Paternal Grandfather    Cancer Paternal Grandfather    Cancer Paternal Grandmother    Stroke Neg Hx    Hypertension Neg Hx    Social History   Socioeconomic History   Marital status: Married    Spouse name: Not on file   Number of children: 2   Years of education: 18   Highest education level: Master's degree (e.g., MA, MS, MEng, MEd, MSW, MBA)  Occupational History  Occupation: clinical Hospital doctor: SELF-EMPLOYED  Tobacco Use   Smoking status: Never   Smokeless tobacco: Never  Vaping Use   Vaping status: Never Used  Substance and Sexual Activity   Alcohol use: No   Drug use: No   Sexual activity: Yes    Birth control/protection: I.U.D.  Other Topics Concern   Not on file  Social History Narrative   1 daughter--Kelsey [name] born 12/09, 1 step son   Right handed   Works in social work   Occasionally caffeine   2 Administrator degree   Social Drivers of Corporate investment banker Strain: Low Risk  (08/14/2023)   Received from [employer]    Overall Financial Resource Strain (CARDIA)    Difficulty of Paying Living Expenses: Not very hard  Food Insecurity: No Food Insecurity (08/14/2023)   Received from [facility]   Hunger Vital Sign    Worried About Running Out of Food in the Last Year: Never true    Ran Out of Food in the Last Year: Never true  Transportation Needs: No Transportation Needs (08/14/2023)   Received from [facility] - Transportation    Lack of Transportation (Medical): No    Lack of Transportation (Non-Medical): No  Physical Activity: Insufficiently Active (08/14/2023)   Received from [facility]   Exercise Vital Sign    Days of Exercise per Week: 2 days    Minutes of Exercise per Session: 30 min  Stress: No Stress Concern Present (08/14/2023)   Received from [facility] - Occupational Stress Questionnaire    Feeling of Stress : Not at all  Social Connections: Socially Integrated (08/14/2023)   Received from [facility]   Social Network    How would you rate your social network (family, work, friends)?: Good participation with social networks   Past Surgical History:  Procedure Laterality Date   CESAREAN SECTION     CESAREAN SECTION  08/31/2011   Procedure: CESAREAN SECTION;  Surgeon: Tresa Endo A. Ernestina Penna, MD;  Location: [facility];  Service: Gynecology;  Laterality: [name];  Repeat cesarean section with delivery of baby    COLPOSCOPY     KNEE ARTHROSCOPY     right knee   WISDOM TOOTH EXTRACTION     Past Medical History:  Diagnosis Date   Abnormal Pap smear    Anemia    Asthma 1996   Bipolar disorder (HCC)    Chronic sinusitis    GERD (gastroesophageal reflux disease) 2009   Headache(784.0)    Hypertension 2021   Pyelonephritis 2008   Pylo and stones in the past   Sinus problem    sinusitis   Thyroid disease 2017   BP 112/75   Pulse (!) 103   Ht 5\' 3"  (1.6 m)   Wt 142 lb (64.4 kg)   SpO2 99%   BMI 25.15 kg/m   Opioid Risk Score:    Fall Risk Score:  `1  Depression screen [facility] 2/9     02/28/2023    9:56 AM 11/08/2022   10:46 AM 09/27/2022   10:11 AM 07/26/2022   10:54 AM 11/15/2021    4:00 PM 10/19/2020    7:59 AM 06/19/2020   12:32 PM  Depression screen PHQ 2/9  Decreased Interest 0 0 0 0 0 0 0  Down, Depressed, Hopeless 0 0 0 0 0 0 0  PHQ - 2 Score 0 0 0 0  0 0 0  Altered sleeping    1  0 0  Tired, decreased energy    1  0 0  Change in appetite    1  0 0  Feeling bad or failure about yourself     0  0 0  Trouble concentrating    1  0 0  Moving slowly or fidgety/restless    0  0 0  Suicidal thoughts    0  0 0  PHQ-9 Score    4  0 0  Difficult doing work/chores    Somewhat difficult  Not difficult at all Not difficult at all     Review of Systems  Constitutional: Negative.   HENT: Negative.    Eyes: Negative.   Respiratory: Negative.    Cardiovascular: Negative.   Gastrointestinal:  Positive for abdominal pain, constipation and diarrhea.  Endocrine: Negative.   Genitourinary: Negative.   Musculoskeletal:        Joint pain  Skin: Negative.   Allergic/Immunologic: Negative.   Neurological: Negative.   Hematological: Negative.   Psychiatric/Behavioral: Negative.    All other systems reviewed and are negative.      Objective:   Physical Exam Gen: no distress, normal appearing HEENT: oral mucosa pink and moist, NCAT Cardio: Reg rate Chest: normal effort, normal rate of breathing Abd: soft, non-distended Ext: no edema Psych: pleasant, normal affect Skin: intact Neuro: Alert and oriented x3     Assessment & Plan:  1) Chronic Pain Syndrome with diffuse joint pain/fibromyalgia -discussed trying to wean off the low dose naltrexone, discussed plan to stop and resume if needed -recommended using her hot tub, discussed benefits of sauna -Discussed current symptoms of pain and history of pain.  -discussed her history of LONG COVID in 2021 and EBV and Lyme positive markers -discussed her history of  being on antibiotics -discussed following with GI -discussed eliminating things that are unnecessary in her life -d/c low dose naltrexone since was not effective enough -continue cymbalta 20mg  daily -discussed the Book FibroFix -discussed getting a CBC with differentials, checking iron panel -discussed benefit from heat -called [facility] to check on LDN script, will increased dose to 2mg  HS -prescribed vitamin D -discussed that she smells gasoline all the time.  -discussed methylene blue -discussed red light therapy -discussed that valcyclovir seemed to help, change back to prn in case she is developing tolerance -Discussed benefits of exercise in reducing pain. -discussed mechanism of action of low dose naltrexone as an opioid receptor antagonist which stimulates your body's production of its own natural endogenous opioids, helping to decrease pain. Discussed that it can also decrease T cell response and thus be helpful in decreasing inflammation, and symptoms of brain fog, fatigue, anxiety, depression, and allergies. Discussed that this medication needs to be compounded at a compounding pharmacy and can more expensive. Discussed that I usually start at 1mg  and if this is not providing enough relief then I titrate upward on a monthly basis.    -Discussed following foods that may reduce pain: 1) Ginger (especially studied for arthritis)- reduce leukotriene production to decrease inflammation 2) Blueberries- high in phytonutrients that decrease inflammation 3) Salmon- marine omega-3s reduce joint swelling and pain 4) Pumpkin seeds- reduce inflammation 5) dark chocolate- reduces inflammation 6) turmeric- reduces inflammation 7) tart cherries - reduce pain and stiffness 8) extra virgin olive oil - its compound olecanthal helps to block prostaglandins  9) chili peppers- can be eaten or applied topically via capsaicin 10)  mint- helpful for headache, muscle aches, joint pain, and  itching 11) garlic- reduces inflammation  Link to further information on diet for chronic pain: http://www.bray.com/   2) Fatigue -discussed her prior sleep studies -discussed red light therapy -continue modafinil -recommended sauna use, recommended hot tub use -discussed   3) IBS -discussed her food allergy testing and that she does not often eat salmon, dairy, shrimp, cashews -discussed that new food allergy testing shows reactivity to pork, cheese, wheat, casein -discussed that she has cut out gluten -discussed that this feels better -discussed that she feels the probiotic is helpful -recommended pendulum GLP-1  4) Chronic sinus infections  5) Suboptimal vitamin D: -sent Vitamin D ergocalciferol 50,000U once per week for 7 weeks.   6)Anxiety: -Discussed exercise and meditation as tools to decrease anxiety. -Recommended Down Dog Yoga app -Discussed spending time outdoors. -Discussed positive re-framing of anxiety.  -Discussed the following foods that have been show to reduce anxiety: 1) [place] nuts, mushrooms, soy beans due to their high selenium content. Upper limit of toxicity of selenium is 446mcg/day so no more than 3-4 [place] nuts per day.  2) Fatty fish such as salmon, mackerel, sardines, trout, and herring- high in omega-3 fatty acids 3) Eggs- increases serotonin and dopamine 4) Pumpkin seeds- high in omega-3 fatty acids 5) dark chocolate- high in flavanols that increase blood flow to brain 6) turmeric- take with black pepper to increase absorption 7) chamomile tea- antioxidant and anti-inflammatory properties 8) yogurt without sugar- supports gut-brain axis 9) green tea- contains L- theanine 10) blueberries- high in vitamin C and antioxidants 11) [place]- high in tryptophan which gets converted to serotonin 12) bell peppers- rich in vitamin C and antioxidants 13) citrus fruits- rich in vitamin C  and antioxidants 14) almonds- high in vitamin E and healthy fats 15) chia seeds- high in omega-3 fatty acids   7) Mouth ulcers -continue acyclovir, change back to prn -she asks if this is an indication of viral load.  -trial lysine 500mg  to 3,000mg , discussed that herpes virus chokes on lysine  -Provided with a pain relief journal and discussed that it contains foods and lifestyle tips to naturally help to improve pain. Discussed that these lifestyle strategies are also very good for health unlike some medications which can have negative side effects. Discussed that the act of keeping a journal can be therapeutic and helpful to realize patterns what helps to trigger and alleviate pain.    8) Insomnia:  -continue magnesium supplement and tart cherry juice.   9) Elevated LDL -reviewed LDL and the level is 107 Provided with list of supplements that can help with dyslipidemia: 1) Vitamin B3 500-4,000mg  in divided doses daily (would recommend starting low as can cause uncomfortable facial flushing if started at too high a dose) 2) Phytosterols 2.15 grams daily 3) Fermented soy 30-50 grams daily 4) EGCG (found in green tea): 500-1000mg  daily 5) Omega-3 fatty acids 3000-5,000mg  daily 6) Flax seed 40 grams daily 7) Monounsaturated fats 20-40 grams daily (olives, olive oil, nuts), also reduces cardiovascular disease 8) Sesame: 40 grams daily 9) Gamma/delta tocotrienols- a family of unsaturated forms of Vitamin E- 200mg  with dinner 10) Pantethine 900mg  daily in divided doses 11) Resveratrol 250mg  daily 12) N Acetyl Cysteine 2000mg  daily in divided doses 13) Curcumin 2000-5000mg  in divided doses daily 14) Pomegranate juice: 8 ounces daily, also helps to lower blood pressure 15) Pomegranate seeds one cup daily, also helps to lower blood pressure 16) Citrus Bergamot 1000mg  daily, also helps  with glucose control and weight loss 17) Vitamin C 500mg  daily 18) Quercetin [id number]  daily 19)  Glutathione 20) Probiotics 60-100 billion organisms per day 21) Fiber 22) Oats 23) Aged garlic (can eat as food or supplement of 600-900mg  per day) 24) Chia seeds 25 grams per day 25) Lycopene- carotenoid found in high concentrations in tomatoes. 26) Alpha linolenic acid 27) Flavonoids and anthocyanins 28) Wogonin- flavanoid that enhances reverse cholesterol transport 29) Coenzyme Q10 30) Pantethine- derivative of Vitamin B5: 300mg  three times per day or 450mg  twice per day with or without food 31) Barley and other whole grains 32) Orange juice 33) L- carnitine 34) L- Lysine 35) L- Arginine 36) Almonds 37) Morin 38) Rutin 39) Carnosine 40) Histidine  41) Kaempferol  42) Organosulfur compounds 43) Vitamin E 44) Oleic acid 45) RBO (ferulic acid gammaoryzanol) 46) grape seed extract 47) Red wine 48) Berberine HCL 500mg  daily or twice per day- more effective and with fewer adverse effects that ezetimibe monotherapy 49) red yeast rice 2400- 4800 mg/day 50) chlorella 51) Licorice   10) HTN:  -she asks whether this may be contributed to headache -continue valsartan and coreg -discussed that sterss is improved -discussed that on days that she is more hurried she is more stressed  11) Anxiety:  -dicussed that she is doing well in this regard  12) Neuropathic pain: -continue tens unit -discussed this can be from cervical spinal nerve compression -discussed increasing topamax to BID -discussed PT  -Discussed Qutenza as an option for neuropathic pain control. Discussed that this is a capsaicin patch, stronger than capsaicin cream. Discussed that it is currently approved for diabetic peripheral neuropathy and post-herpetic neuralgia, but that it has also shown benefit in treating other forms of neuropathy. Provided patient with link to site to learn more about the patch: https://www.clark.biz/. Discussed that the patch would be placed in office and benefits usually last 3  months. Discussed that unintended exposure to capsaicin can cause severe irritation of eyes, mucous membranes, respiratory tract, and skin, but that Qutenza is a local treatment and does not have the systemic side effects of other nerve medications. Discussed that there may be pain, itching, erythema, and decreased sensory function associated with the application of Qutenza. Side effects usually subside within 1 week. A cold pack of analgesic medications can help with these side effects. Blood pressure can also be increased due to pain associated with administration of the patch.

## 2023-09-25 LAB — CSF CULTURE W GRAM STAIN
MICRO NUMBER:: 16103691
Result:: NO GROWTH
SPECIMEN QUALITY:: ADEQUATE

## 2023-09-25 LAB — CSF CELL COUNT WITH DIFFERENTIAL
RBC Count, CSF: 0 {cells}/uL
TOTAL NUCLEATED CELL: 2 {cells}/uL (ref 0–5)

## 2023-09-25 LAB — FUNGUS CULTURE W SMEAR
CULTURE:: NO GROWTH
MICRO NUMBER:: 16103690
SMEAR:: NONE SEEN
SPECIMEN QUALITY:: ADEQUATE

## 2023-09-25 LAB — ANAEROBIC CULTURE W GRAM STAIN
MICRO NUMBER:: 16103689
SPECIMEN QUALITY:: ADEQUATE

## 2023-09-25 LAB — PROTEIN, CSF: Total Protein, CSF: 43 mg/dL (ref 15–45)

## 2023-09-25 LAB — VDRL, CSF: VDRL Quant, CSF: NONREACTIVE

## 2023-09-25 LAB — OLIGOCLONAL BANDS, CSF + SERM: Oligo Bands: ABSENT

## 2023-09-25 LAB — GLUCOSE, CSF: Glucose, CSF: 50 mg/dL (ref 40–80)

## 2023-09-28 ENCOUNTER — Encounter: Payer: Self-pay | Admitting: Obstetrics and Gynecology

## 2023-10-01 ENCOUNTER — Telehealth: Payer: Self-pay

## 2023-10-01 ENCOUNTER — Telehealth: Payer: Self-pay | Admitting: Physician Assistant

## 2023-10-01 NOTE — Telephone Encounter (Signed)
 I left message to call in regarding referral to [facility] update [place] on appt.

## 2023-10-01 NOTE — Telephone Encounter (Signed)
 Pt called in stating [facility] does not have the referral. She said they wanted to make sure it was being faxed to [phone]?

## 2023-10-01 NOTE — Progress Notes (Unsigned)
 GYNECOLOGY OFFICE VISIT NOTE  History:   Jai Bear is a 48 y.o. [phone] here today for BTB that started recently in setting of IUD that has been in place for several years (placed 05/24/2020). Usually amenorrheic with her LNG IUDs.   In November for a constellation of perimenopause symptoms, she was also started on the Climara E2 patch at 0.05mg  weekly.  Discussed the use of AI scribe software for clinical note transcription with the patient, who gave verbal consent to proceed.  History of Present Illness   Naquisha Whitehair is a 48 year old female who presents with spotting and cramping.  She has been experiencing spotting and cramping for about a week and a half, which is unusual for her as she typically does not have periods with her IUDs. The spotting and cramping began last Sunday or Monday and have persisted since then. The cramping began coinciding with sexual intercourse, which is infrequent due to lifestyle factors. The cramping is described as constant, shifting from the front abdomen to the back, and is not relieved by ibuprofen. She mentions a sensation of pinching at times, which is not a cramp but a 'weird twinge'.  She has had IUDs since her first daughter was born in 2000, including Mirena, Paragard briefly, and currently [place]. With the exception of Paragard, she typically does not experience spotting or periods. She recalls the insertion of the Liletta being more painful compared to Mirena.  She is currently on hormone replacement therapy, which has been helpful for night sweats but has not alleviated fatigue or pain. She is on a 0.5 mg dose and recalls starting at 0.375 mg before increasing due to persistent night sweats.     The following portions of the patient's history were reviewed and updated as appropriate: allergies, current medications, past family history, past medical history, past social history, past surgical history and problem list.    Health Maintenance:   Diagnosis  Date Value Ref Range Status  05/14/2023   Final   - Negative for intraepithelial lesion or malignancy (NILM)   HPV Negative  Normal mammogram on 05/2023. Birad1, heterog dense.   Review of Systems:  Pertinent items noted in HPI and remainder of comprehensive ROS otherwise negative.  Physical Exam:  BP (!) 140/89   Pulse (!) 112   Ht 5\' 3"  (1.6 m)   Wt 140 lb (63.5 kg)   BMI 24.80 kg/m  CONSTITUTIONAL: Well-developed, well-nourished female in no acute distress.  HEENT:  Normocephalic, atraumatic. External right and left ear normal. No scleral icterus.  NECK: Normal range of motion, supple, no masses noted on observation SKIN: No rash noted. Not diaphoretic. No erythema. No pallor. MUSCULOSKELETAL: Normal range of motion. No edema noted. NEUROLOGIC: Alert and oriented to person, place, and time. Normal muscle tone coordination. No cranial nerve deficit noted. PSYCHIATRIC: Normal mood and affect. Normal behavior. Normal judgment and thought content.  PELVIC: Normal appearing external genitalia; normal urethral meatus; normal appearing vaginal mucosa and cervix.  No abnormal discharge noted. IUD strings as expected. Performed in the presence of a chaperone  Labs and Imaging No results found for this or any previous visit (from the past week). No results found.  Assessment and Plan:   1. Perimenopause (Primary) Continue current E2 patch dose  2. Breakthrough bleeding with IUD Discussed most likely related to malposition given after intercourse and associated with cramping and spotting. If due to E2, would expect bleeding alone. Reviewed option for removal and replace  with mirena. Reviewed ibuprofen before hand and then numbing with procedure. She would like to try this.    Routine preventative health maintenance measures emphasized. Please refer to After Visit Summary for other counseling recommendations.   No follow-ups on file.  Milas Hock, MD, FACOG Obstetrician & Gynecologist, [facility] for [facility], [facility]

## 2023-10-02 ENCOUNTER — Encounter: Payer: Self-pay | Admitting: Obstetrics and Gynecology

## 2023-10-02 ENCOUNTER — Ambulatory Visit (INDEPENDENT_AMBULATORY_CARE_PROVIDER_SITE_OTHER): Admitting: Obstetrics and Gynecology

## 2023-10-02 VITALS — BP 140/89 | HR 112 | Ht 63.0 in | Wt 140.0 lb

## 2023-10-02 DIAGNOSIS — N951 Menopausal and female climacteric states: Secondary | ICD-10-CM

## 2023-10-02 DIAGNOSIS — N921 Excessive and frequent menstruation with irregular cycle: Secondary | ICD-10-CM

## 2023-10-02 DIAGNOSIS — Z975 Presence of (intrauterine) contraceptive device: Secondary | ICD-10-CM

## 2023-10-02 NOTE — Telephone Encounter (Signed)
 Resent referral again to [facility], also sent her a my chart message.

## 2023-10-03 ENCOUNTER — Encounter: Admitting: Physical Medicine and Rehabilitation

## 2023-10-03 ENCOUNTER — Telehealth: Payer: Self-pay | Admitting: Physician Assistant

## 2023-10-03 NOTE — Telephone Encounter (Signed)
 [facility] LM wanting a call back to see what sub specialty the referral needs to go to. The lady did not leave her name.

## 2023-10-03 NOTE — Telephone Encounter (Signed)
 I left message it is for neurology. The referral.

## 2023-10-08 ENCOUNTER — Encounter: Payer: Self-pay | Admitting: Obstetrics and Gynecology

## 2023-10-08 ENCOUNTER — Ambulatory Visit: Admitting: Obstetrics and Gynecology

## 2023-10-08 VITALS — BP 128/85 | HR 89 | Ht 63.0 in | Wt 140.0 lb

## 2023-10-08 DIAGNOSIS — Z30433 Encounter for removal and reinsertion of intrauterine contraceptive device: Secondary | ICD-10-CM | POA: Diagnosis not present

## 2023-10-08 DIAGNOSIS — Z30430 Encounter for insertion of intrauterine contraceptive device: Secondary | ICD-10-CM

## 2023-10-08 DIAGNOSIS — Z3202 Encounter for pregnancy test, result negative: Secondary | ICD-10-CM

## 2023-10-08 LAB — POCT URINE PREGNANCY: Preg Test, Ur: NEGATIVE

## 2023-10-08 MED ORDER — LEVONORGESTREL 20 MCG/DAY IU IUD
1.0000 | INTRAUTERINE_SYSTEM | Freq: Once | INTRAUTERINE | Status: AC
  Administered 2023-10-08: 1 via INTRAUTERINE

## 2023-10-08 NOTE — Progress Notes (Unsigned)
    GYNECOLOGY OFFICE PROCEDURE NOTE  Kelsey [name] is a 48 y.o. [phone] here for IUD removal & insertion  IUD Insertion Procedure Note Procedure: IUD removal followed by IUD insertion with Mirena UPT: Negative  Patient identified.  Risks, benefits and alternatives of procedure were discussed including irregular bleeding, cramping, infection, malpositioning or misplacement of the IUD outside the uterus which may require further procedure such as laparoscopy. Also discussed >99% contraception efficacy, increased risk of ectopic pregnancy with failure of method.   Emphasized that this did not protect against STIs, condoms recommended during all sexual encounters. Consent signed. Time out performed.   Speculum inserted and cervix visualized. The strings of the IUD were grasped and pulled using ring forceps. The IUD was successfully removed in its entirety.   Cervix prepped with 3 swabs of betadine. 3cc of intracervica lidocaine 1% was injected at 12 o'clock. Cervix grasped with a single tooth tenaculum. Attempted to pass IUD without success. Os finder used to progressively dilate the cervix. Uterus noted to be pretty sharply anteverted. A transabdominal US was used to help guide IUD placement. The IUD was inserted without difficulty per manufacturers instructions. Appropriate intrauterine placement was seen on [place] while placement was performed. The strings were cut to 3cm below cervical os and all instruments were removed.   Pt had some dizziness/nausea during procedure but overall tolerated procedure well.   Discussed concerning signs/symptoms and to call if heavy bleeding, severe abdominal pain, or fever in the following 3 weeks. Manufacturer pamphlet/patient information given. Reviewed timing of efficacy for contraception and to use an alternative form of birth control until that time.  Harvie Bridge, MD Obstetrician & Gynecologist, [facility] for [employer],  [facility]

## 2023-10-08 NOTE — Patient Instructions (Signed)
 It was nice meeting you today. It is normal to have cramping and bleeding, but you should feel better every day.   Please call us if you have any severe pain, bleeding that soaks more than 1 pad in a hour, have fevers, or feel like you're going to pass out.   You can take tylenol 1000mg  every 8 hours and ibuprofen 800mg  every 8 hours as needed for pain. It is ok to take both at the same time.

## 2023-10-10 NOTE — Telephone Encounter (Signed)
 Patrice from referred provider at [facility] needs new referral including the specific diagnosis and sub as Neurology on the referral fax [phone]

## 2023-10-10 NOTE — Telephone Encounter (Signed)
 Sent again to [facility].

## 2023-10-20 ENCOUNTER — Ambulatory Visit: Admitting: Obstetrics and Gynecology

## 2023-11-03 ENCOUNTER — Encounter: Payer: Self-pay | Admitting: Obstetrics and Gynecology

## 2023-11-03 DIAGNOSIS — N951 Menopausal and female climacteric states: Secondary | ICD-10-CM

## 2023-11-11 MED ORDER — ESTRADIOL 1 MG PO TABS: 1.0000 mg | ORAL_TABLET | Freq: Every day | ORAL | 3 refills | Status: DC

## 2023-11-11 NOTE — Addendum Note (Signed)
 Addended by: Lacey Pian A on: 11/11/2023 09:41 AM   Modules accepted: Orders

## 2023-11-12 ENCOUNTER — Ambulatory Visit: Payer: Self-pay

## 2023-11-12 NOTE — Telephone Encounter (Signed)
  Chief Complaint: fatigue Symptoms: Generalized joint pain sometimes with tingling, generalized fatigue, nausea, constipation, diarrhea Frequency: x multiple years and worsened x 1 year Pertinent Negatives: Patient denies chest pain, SOB, vomiting Disposition: [] ED /[] Urgent Care (no appt availability in office) / [x] Appointment(In office/virtual)/ []  [facility]/ [] Home Care/ [] Refused Recommended Disposition /[] [facility]/ []  Follow-up with PCP Additional Notes: Patient states she has seen the neurologist and was tested for MS and it was "didn't look like it" but they did find "white lesions" on her brain. Patient states she did some hormonal testing with her OBGYN as well and was told she is perimenopausal and they addressed/treated it. Patient aware her PCP is out on maternity leave at this time. Patient requesting to schedule with Dr T. Called [facility] and transferred patient to staff for scheduling.  Copied from CRM [id number]. Topic: Clinical - Red Word Triage >> Nov 12, 2023  8:28 AM Danelle Dunning F wrote: [facility] that prompted transfer to Nurse Triage: extreme fatigue; gi issues; joint pain; neuropathic pain Reason for Disposition  [1] MODERATE weakness (i.e., interferes with work, school, normal activities) AND [2] persists > 3 days  Answer Assessment - Initial Assessment Questions 1. DESCRIPTION: "Describe how you are feeling."     Fatigue, tired all the time regardless of how much rest she gets. States goes to bed and wakes  up exhausted. She states if she doesn't take the modafinil  she will fall asleep at work. She states it also feels like a generalized weakness.  2. SEVERITY: "How bad is it?"  "Can you stand and walk?"   - MILD (0-3): Feels weak or tired, but does not interfere with work, school or normal activities.   - MODERATE (4-7): Able to stand and walk; weakness interferes with work, school, or normal activities.   - SEVERE (8-10): Unable to stand or walk;  unable to do usual activities.     Moderate.  3. ONSET: "When did these symptoms begin?" (e.g., hours, days, weeks, months)     Multiple years, worsened over the past year.  4. CAUSE: "What do you think is causing the weakness or fatigue?" (e.g., not drinking enough fluids, medical problem, trouble sleeping)     Unsure, she states she is awaiting a referral out to neurology at [facility], [place] or [place]. 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  5. NEW MEDICINES:  "Have you started on any new medicines recently?" (e.g., opioid pain medicines, benzodiazepines, muscle relaxants, antidepressants, antihistamines, neuroleptics, beta blockers)     Patient increased her modafinil  to 200mg  about 4-6 months ago.  6. OTHER SYMPTOMS: "Do you have any other symptoms?" (e.g., chest pain, fever, cough, SOB, vomiting, diarrhea, bleeding, other areas of pain)     Generalized joint pain sometimes with tingling, nausea,  intermittent constipation or diarrhea.  7. PREGNANCY: "Is there any chance you are pregnant?" "When was your last menstrual period?"     No menstrual period due to birth control.  Protocols used: Weakness (Generalized) and Fatigue-A-AH

## 2023-11-12 NOTE — Telephone Encounter (Signed)
 This request has been handled. No further action is required. Patient has been scheduled with Dr. Elva Hamburger on 11/13/23.

## 2023-11-13 ENCOUNTER — Encounter: Payer: Self-pay | Admitting: Family Medicine

## 2023-11-13 ENCOUNTER — Ambulatory Visit (INDEPENDENT_AMBULATORY_CARE_PROVIDER_SITE_OTHER): Admitting: Sports Medicine

## 2023-11-13 VITALS — BP 145/100 | HR 106 | Wt 140.0 lb

## 2023-11-13 DIAGNOSIS — R9082 White matter disease, unspecified: Secondary | ICD-10-CM | POA: Diagnosis not present

## 2023-11-13 DIAGNOSIS — R1031 Right lower quadrant pain: Secondary | ICD-10-CM

## 2023-11-13 MED ORDER — DEXAMETHASONE 4 MG PO TABS: 4.0000 mg | ORAL_TABLET | Freq: Three times a day (TID) | ORAL | 0 refills | Status: DC

## 2023-11-13 NOTE — Progress Notes (Signed)
 Procedures performed today:    None.  Independent interpretation of notes and tests performed by another provider:   None.  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 She has been working with neurology and they have try to get her in with a tertiary center but have been unable. She has had multiple rheumatoid workups, for the most part they have been negative however on 2 occasions approximately 5 to 6 years ago she did have a positive rheumatoid factor but never a positive CCP, ESRs have always been normal. ANAs have been normal. It was suggested that she may have a vasculitis, ANCA screens have been normal. Morning cortisol was normal. All of the routine lab testing has been normal. The plan with neurology has been repeat brain MRI in 6 months for follow-up eval. I agree with this. She has already worked with cardiology, she had a family history of long QT syndrome, ultimately after work at the [facility] and decades of beta-blocker treatment it was determined she does not have long QT syndrome.  She is still on valsartan . Blood pressures have run normal. She has had multiple forms of treatment including injectable steroids, SNRIs. She has diagnoses of chronic pain syndrome, fibromyalgia. She is here to see me as essentially a new set of eyes. We talked in depth about the above, she is having some abdominal pain, right lower quadrant with diarrhea flares  approximately 3-5 times per week, she will stool multiple times per day, no obvious hematochezia or melena. She does not endorse any oral or anal ulcers or sores. She last had general abdominal imaging almost 20 years ago. On exam there is minimal discomfort right lower quadrant. Considering her constellation of symptoms, and the workup thus far I think it is reasonable to ensure that were not missing an inflammatory bowel disease here. She has had negative serum celiac testing in the past. We will get a CT abdomen and pelvis with oral and IV contrast, I do think she deserves an upper and lower endoscopy to ensure we are not missing an IBD. We will also do a course of dexamethasone, if she does get a dramatic improvement as opposed to only minimal improvement this would point more towards autoimmune disease. It was also suggested that she work with the long COVID clinic at [facility] as well as potentially neurology at [facility], I am happy to place these referrals. She does need a new PCP, her visit with me was really just as a second opinion/consultation, follow-up can be with PCP. Obvious if we see anything abnormal on the cross-sectional imaging we will manage it.  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 She has been working with neurology and they have try to get her in with a tertiary center but have been unable. She has had multiple rheumatoid workups, for the most part they have been negative however on 2 occasions  approximately 5 to 6 years ago she did have a positive rheumatoid factor but never a positive CCP, ESRs have always been normal. ANAs have been normal. It was suggested that she may have a vasculitis, ANCA screens have been normal. Morning cortisol was normal. All of the routine lab testing has been normal. The plan with neurology has been repeat brain MRI in 6 months for follow-up eval. I agree with this. She has already worked with cardiology, she had a family history of long QT syndrome, ultimately after work at the [facility] and decades of beta-blocker treatment it was determined she does not have long QT syndrome.  She is still on valsartan . Blood pressures have run normal. She has had multiple forms of treatment including injectable steroids, SNRIs. She has diagnoses of chronic pain syndrome, fibromyalgia. She is here to see me as essentially a new set of eyes. We talked in depth about the above, she is having some abdominal pain, right lower quadrant with diarrhea flares approximately 3-5 times per week, she will stool multiple times per day, no obvious hematochezia or melena. She does not endorse any oral or anal ulcers or sores. She last had general abdominal imaging almost 20 years ago. On exam there is minimal discomfort right lower quadrant. Considering her constellation of symptoms, and the workup thus far I think it is reasonable to ensure that were not missing an inflammatory bowel disease here. She has had negative serum celiac testing in the past. We will get a CT abdomen and pelvis with oral and IV contrast, I do think she deserves an upper and lower endoscopy to ensure we are not missing an IBD. We will also do a course of dexamethasone, if she does get a dramatic improvement as opposed to only minimal improvement this would point more towards autoimmune disease. It was also suggested that she work with the long COVID clinic at [facility] as well as potentially neurology at [facility], I  am happy to place these referrals. She does need a new PCP, her visit with me was really just as a second opinion/consultation, follow-up can be with PCP. Obvious if we see anything abnormal on the cross-sectional imaging we will manage it.  I spent 40 minutes of total time managing this patient today, this includes chart review, face to face, and non-face to face time.  ____________________________________________ Joselyn Nicely. Sandy Crumb, M.D., ABFM., CAQSM., AME. Primary Care and Sports Medicine [facility]  Adjunct Professor of [facility] Medicine  [employer]  Restaurant manager, fast food

## 2023-11-13 NOTE — Assessment & Plan Note (Deleted)
 Long COVID clinic at [facility]. Neurology at [facility] Dexamethasone. Abdominal pelvic CT for diarrhea, potentially GI referral.

## 2023-11-13 NOTE — Assessment & Plan Note (Addendum)
 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 She has been working with neurology and they have try to get her in with a tertiary center but have been unable. She has had multiple rheumatoid workups, for the most part they have been negative however on 2 occasions approximately 5 to 6 years ago she did have a positive rheumatoid factor but never a positive CCP, ESRs have always been normal. ANAs have been normal. It was suggested that she may have a vasculitis, ANCA screens have been normal. Morning cortisol was normal. All of the routine lab testing has been normal. The plan with neurology has been repeat brain MRI in 6 months for follow-up eval. I agree with this. She has already worked with cardiology, she had a family history of long QT syndrome, ultimately after work at the [facility] and decades of beta-blocker treatment it was determined she does not have long QT syndrome.  She is still on valsartan . Blood pressures have run normal. She has had multiple forms of treatment including injectable steroids, SNRIs. She has diagnoses of chronic pain syndrome, fibromyalgia. She is here to see me as essentially a new set of eyes. We talked in depth about the above, she is having some abdominal pain, right lower quadrant with diarrhea flares approximately 3-5 times per week, she will stool multiple times per day, no obvious hematochezia or melena. She does not endorse any oral or anal ulcers or sores. She last had general abdominal imaging almost 20 years  ago. On exam there is minimal discomfort right lower quadrant. Considering her constellation of symptoms, and the workup thus far I think it is reasonable to ensure that were not missing an inflammatory bowel disease here. She has had negative serum celiac testing in the past. We will get a CT abdomen and pelvis with oral and IV contrast, I do think she deserves an upper and lower endoscopy to ensure we are not missing an IBD. We will also do a course of dexamethasone, if she does get a dramatic improvement as opposed to only minimal improvement this would point more towards autoimmune disease. It was also suggested that she work with the long COVID clinic at [facility] as well as potentially neurology at [facility], I am happy to place these referrals. She does need a new PCP, her visit with me was really just as a second opinion/consultation, follow-up can be with PCP. Obvious if we see anything abnormal on the cross-sectional imaging we will manage it.

## 2023-11-13 NOTE — Assessment & Plan Note (Signed)
 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 She has been working with neurology and they have try to get her in with a tertiary center but have been unable. She has had multiple rheumatoid workups, for the most part they have been negative however on 2 occasions approximately 5 to 6 years ago she did have a positive rheumatoid factor but never a positive CCP, ESRs have always been normal. ANAs have been normal. It was suggested that she may have a vasculitis, ANCA screens have been normal. Morning cortisol was normal. All of the routine lab testing has been normal. The plan with neurology has been repeat brain MRI in 6 months for follow-up eval. I agree with this. She has already worked with cardiology, she had a family history of long QT syndrome, ultimately after work at the [facility] and decades of beta-blocker treatment it was determined she does not have long QT syndrome.  She is still on valsartan . Blood pressures have run normal. She has had multiple forms of treatment including injectable steroids, SNRIs. She has diagnoses of chronic pain syndrome, fibromyalgia. She is here to see me as essentially a new set of eyes. We talked in depth about the above, she is having some abdominal pain, right lower quadrant with diarrhea flares approximately 3-5 times per week, she will stool multiple times per day, no obvious hematochezia or melena. She does not endorse any oral or anal ulcers or sores. She last had general abdominal imaging almost 20 years  ago. On exam there is minimal discomfort right lower quadrant. Considering her constellation of symptoms, and the workup thus far I think it is reasonable to ensure that were not missing an inflammatory bowel disease here. She has had negative serum celiac testing in the past. We will get a CT abdomen and pelvis with oral and IV contrast, I do think she deserves an upper and lower endoscopy to ensure we are not missing an IBD. We will also do a course of dexamethasone, if she does get a dramatic improvement as opposed to only minimal improvement this would point more towards autoimmune disease. It was also suggested that she work with the long COVID clinic at [facility] as well as potentially neurology at [facility], I am happy to place these referrals. She does need a new PCP, her visit with me was really just as a second opinion/consultation, follow-up can be with PCP. Obvious if we see anything abnormal on the cross-sectional imaging we will manage it.

## 2023-11-21 ENCOUNTER — Ambulatory Visit: Payer: Self-pay | Admitting: Sports Medicine

## 2023-11-21 ENCOUNTER — Ambulatory Visit (INDEPENDENT_AMBULATORY_CARE_PROVIDER_SITE_OTHER)

## 2023-11-21 DIAGNOSIS — R1031 Right lower quadrant pain: Secondary | ICD-10-CM

## 2023-11-21 DIAGNOSIS — R197 Diarrhea, unspecified: Secondary | ICD-10-CM

## 2023-11-21 MED ORDER — IOHEXOL 9 MG/ML PO SOLN
500.0000 mL | ORAL | Status: AC
  Administered 2023-11-21 (×2): 500 mL via ORAL

## 2023-11-21 MED ORDER — IOHEXOL 300 MG/ML  SOLN
100.0000 mL | Freq: Once | INTRAMUSCULAR | Status: AC | PRN
  Administered 2023-11-21: 100 mL via INTRAVENOUS

## 2023-11-25 NOTE — Addendum Note (Signed)
 Addended by: Gean Keels on: 11/25/2023 12:47 PM   Modules accepted: Orders

## 2023-12-05 ENCOUNTER — Encounter: Payer: Self-pay | Admitting: Physical Medicine and Rehabilitation

## 2023-12-22 ENCOUNTER — Ambulatory Visit: Admitting: Family Medicine

## 2023-12-22 ENCOUNTER — Ambulatory Visit (INDEPENDENT_AMBULATORY_CARE_PROVIDER_SITE_OTHER): Payer: Self-pay | Admitting: Urgent Care

## 2023-12-22 ENCOUNTER — Encounter: Payer: Self-pay | Admitting: Urgent Care

## 2023-12-22 VITALS — BP 131/88 | HR 95 | Resp 18 | Ht 63.0 in | Wt 141.0 lb

## 2023-12-22 DIAGNOSIS — Q6102 Congenital multiple renal cysts: Secondary | ICD-10-CM

## 2023-12-22 DIAGNOSIS — R937 Abnormal findings on diagnostic imaging of other parts of musculoskeletal system: Secondary | ICD-10-CM | POA: Diagnosis not present

## 2023-12-22 DIAGNOSIS — R140 Abdominal distension (gaseous): Secondary | ICD-10-CM | POA: Diagnosis not present

## 2023-12-22 DIAGNOSIS — K7689 Other specified diseases of liver: Secondary | ICD-10-CM

## 2023-12-22 DIAGNOSIS — R5383 Other fatigue: Secondary | ICD-10-CM

## 2023-12-22 DIAGNOSIS — R9082 White matter disease, unspecified: Secondary | ICD-10-CM

## 2023-12-22 DIAGNOSIS — Z91018 Allergy to other foods: Secondary | ICD-10-CM

## 2023-12-22 DIAGNOSIS — R109 Unspecified abdominal pain: Secondary | ICD-10-CM

## 2023-12-22 MED ORDER — BUPROPION HCL ER (XL) 150 MG PO TB24
150.0000 mg | ORAL_TABLET | ORAL | 0 refills | Status: AC
Start: 2023-12-22 — End: ?

## 2023-12-22 NOTE — Progress Notes (Signed)
 Established Patient Office Visit  Subjective:  Patient ID: Kelsey [name], female    DOB: [birth year]  Age: 48 y.o. MRN: [id number]  Chief Complaint  Patient presents with   Transitions Of Care    HPI  Discussed the use of AI scribe software for clinical note transcription with the patient, who gave verbal consent to proceed.  History of Present Illness   Kelsey [name] is a 48 year old female who presents with persistent symptoms post-COVID-19 infection.  Since contracting COVID-19 in 2021, she has experienced significant fatigue and pain, leading to a four-month absence from work. The fatigue is overwhelming, and she uses modafinil  to manage it, which helps her stay awake during work hours.  She has chronic pain in her neck, shoulders, and legs, described as neuropathic and aching, persisting for approximately four years. Acupuncture provides partial relief, but medications like ibuprofen , Aleve, Tylenol , and Topamax  have not been effective. She experiences shooting pain down her legs, described as 'electrical' or 'impulse' type pain.  In 2023, she visited urgent care for a suspected hypertensive headache and was referred to the ER, where a CT scan revealed white matter disease. Neurology evaluations, including CT scans and a spinal tap, indicated brain lesions and acute and chronic inflammation in the CSF. Multiple sclerosis was considered, but no spinal cord lesions were found, oligoclonal banding was negative.  She has a history of mold exposure in 2017-2018 and tested positive for two bands of Lyme disease in 2018. 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 She has a history of anemia and has been on and off iron supplements and vitamin B12 injections, which have not significantly helped her fatigue.  She experiences gastrointestinal symptoms, including alternating constipation and diarrhea, stomach cramps, and  bloating. She has a history of heartburn and has not been tested for H. pylori. She has multiple food allergies, including wheat, rye, oat, pork, cheese, and eggs, and avoids these foods.  Her current medications include modafinil , Wellbutrin 100 mg once daily, Cymbalta, valsartan  80 mg, and semaglutide for weight maintenance. She has a history of high blood pressure and has been working with a Publishing rights manager to manage her weight, which has been stable for over a year.       Patient Active Problem List   Diagnosis Date Noted   Abnormal MRI, spine 12/22/2023   Abdominal bloating with cramps 12/22/2023   Abdominal pain, right lower quadrant 11/13/2023   Acute bacterial sinusitis 08/07/2023   Viral URI 08/07/2023   White matter disease 06/12/2023   Lymphadenopathy, cervical 06/12/2023   Arthralgia 04/30/2023   Hot flashes 04/30/2023   Pruritus 04/30/2023   B12 deficiency 04/30/2023   Acute lower respiratory infection 11/27/2022   Weight gain 11/23/2021   OSA (obstructive sleep apnea) 11/23/2021   Vomiting without nausea 10/19/2020   Iron deficiency anemia 10/19/2020   Gastroesophageal reflux disease 10/19/2020   Change in bowel habit 10/19/2020   Sinus problem    Bipolar disorder (HCC)    Anemia    Daytime somnolence 04/19/2020   Symptomatic PVCs 04/19/2020   History of COVID-19 03/16/2020   Other fatigue 02/25/2020   Palpitations 02/25/2020   Essential hypertension 02/25/2020   Family history of long QT syndrome 02/25/2020   Rheumatoid factor positive 03/23/2018   Acquired hypothyroidism 02/12/2018   Asthma with allergic rhinitis 12/12/2014   Vitamin D  deficiency 12/12/2014   Nevus of finger 09/14/2013   Migraine headache 06/21/2013  Bulimia nervosa 06/21/2013   Routine general medical examination at a health care facility 05/13/2012   Contact dermatitis 05/04/2012   Bunion of great toe 12/26/2010   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/2010   Allergic rhinitis 07/16/2010    MYALGIA 06/25/2010   VERTIGO 05/07/2010   LOW BACK PAIN SYNDROME 03/02/2010   NEOPLASM OF UNCERTAIN BEHAVIOR OF SKIN 10/03/2008   Pyelonephritis 2008   Past Medical History:  Diagnosis Date   Abnormal Pap smear    Anemia    Asthma 1996   Bipolar disorder (HCC)    Chronic sinusitis    GERD (gastroesophageal reflux disease) 2009   Headache(784.0)    Hypertension 2021   Pyelonephritis 2008   Pylo and stones in the past   Sinus problem    sinusitis   Thyroid  disease 2017   Past Surgical History:  Procedure Laterality Date   CESAREAN SECTION     CESAREAN SECTION  08/31/2011   Procedure: CESAREAN SECTION;  Surgeon: Loetta Ringer A. Johnston Nao, MD;  Location: [facility];  Service: Gynecology;  Laterality: [name];  Repeat cesarean section with delivery of baby    COLPOSCOPY     KNEE ARTHROSCOPY     right knee   WISDOM TOOTH EXTRACTION     Social History   Tobacco Use   Smoking status: Never   Smokeless tobacco: Never  Vaping Use   Vaping status: Never Used  Substance Use Topics   Alcohol use: No   Drug use: No      ROS: as noted in HPI  Objective:     BP 131/88 (BP Location: Left Arm, Patient Position: Sitting, Cuff Size: Normal)   Pulse 95   Resp 18   Ht 5' 3 (1.6 m)   Wt 141 lb (64 kg)   SpO2 98%   BMI 24.98 kg/m  BP Readings from Last 3 Encounters:  12/22/23 131/88  11/13/23 (!) 145/100  10/08/23 128/85   Wt Readings from Last 3 Encounters:  12/22/23 141 lb (64 kg)  11/13/23 140 lb (63.5 kg)  10/08/23 140 lb (63.5 kg)      Physical Exam Vitals and nursing note reviewed.  HENT:     Head: Normocephalic and atraumatic.     Nose: Nose normal.     Mouth/Throat:     Mouth: Mucous membranes are moist.   Eyes:     General: No scleral icterus.       Right eye: No discharge.        Left eye: No discharge.     Extraocular Movements: Extraocular movements intact.     Pupils: Pupils are equal, round, and reactive to light.   Neck:     Comments: Negative  Lhermittes sign Cardiovascular:     Rate and Rhythm: Normal rate and regular rhythm.  Pulmonary:     Effort: Pulmonary effort is normal. No respiratory distress.     Breath sounds: Normal breath sounds. No wheezing.  Abdominal:     General: Abdomen is flat. There is no distension.     Palpations: Abdomen is soft.     Tenderness: There is no abdominal tenderness. There is no guarding or rebound.   Musculoskeletal:     Cervical back: Normal range of motion and neck supple. No rigidity or tenderness.  Lymphadenopathy:     Cervical: No cervical adenopathy.   Skin:    General: Skin is warm and dry.     Findings: No erythema or rash.   Neurological:     General: No  focal deficit present.     Mental Status: She is oriented to person, place, and time.     Gait: Gait normal.   Psychiatric:        Mood and Affect: Mood normal.        Behavior: Behavior normal.      No results found for any visits on 12/22/23.  Last CBC Lab Results  Component Value Date   WBC 5.0 06/12/2023   HGB 13.5 06/12/2023   HCT 41.6 06/12/2023   MCV 95 06/12/2023   MCH 30.8 06/12/2023   RDW 11.9 06/12/2023   PLT 262 06/12/2023   Last metabolic panel Lab Results  Component Value Date   GLUCOSE 79 04/30/2023   NA 138 04/30/2023   K 4.2 04/30/2023   CL 99 04/30/2023   CO2 26 04/30/2023   BUN 7 04/30/2023   CREATININE 0.76 04/30/2023   EGFR 98 04/30/2023   CALCIUM  9.1 04/30/2023   PROT 7.0 08/15/2023   ALBUMIN 4.2 04/30/2023   LABGLOB 2.2 04/30/2023   AGRATIO 1.6 11/06/2021   BILITOT 0.4 04/30/2023   ALKPHOS 54 04/30/2023   AST 15 04/30/2023   ALT 13 04/30/2023   ANIONGAP 14 01/31/2020   Last lipids Lab Results  Component Value Date   CHOL 220 (H) 06/20/2023   HDL 81 06/20/2023   LDLCALC 130 (H) 06/20/2023   TRIG 49 06/20/2023   CHOLHDL 2.7 06/20/2023   Last hemoglobin A1c No results found for: HGBA1C Last thyroid  functions Lab Results  Component Value Date   TSH 1.990  05/14/2023   Last vitamin D  Lab Results  Component Value Date   VD25OH 46.2 07/26/2022   Last vitamin B12 and Folate Lab Results  Component Value Date   VITAMINB12 1,215 11/06/2021   FOLATE 12.5 10/19/2020      The 10-year ASCVD risk score (Arnett DK, et al., 2019) is: 0.8%  Assessment & Plan:  White matter disease  Abdominal bloating with cramps -     H. pylori breath test  Abnormal MRI, spine  Other fatigue -     buPROPion HCl ER (XL); Take 1 tablet (150 mg total) by mouth every morning.  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 Modafinil  used for management. Potential post-viral syndrome, anemia, or autoimmune conditions considered. Cardiology suggested tertiary care referral. - Continue modafinil  for fatigue management. - Consider referral to a tertiary care center for further evaluation.  Chronic Pain Syndrome Chronic neuropathic pain in neck, shoulders, and legs. Acupuncture provides some relief. Possible post-COVID syndrome or other conditions like MS or vasculitis considered. - Continue acupuncture for pain management. - Evaluate for potential underlying causes including MS and vasculitis.  White Matter Disease White matter lesions on CT, suspicion of MS. CSF showed inflammation. Inconsistent neurology follow-up. Referral to tertiary care suggested but declined by [facility]. - Follow up with neurology for further evaluation. - Consider referral to a tertiary care center for comprehensive assessment.  Depression and Anxiety Depressive symptoms possibly secondary to chronic fatigue and pain. Anxiety related to personal stressors. Managed with Cymbalta and Wellbutrin. Discussed switching Wellbutrin to XL formulation. - Switch Wellbutrin to XL formulation. - Continue Cymbalta 30 mg. - Follow up with psychiatrist in two  weeks.  Abdominal Pain with Altered Bowel Habits Intermittent abdominal pain with constipation and diarrhea. Previous CT showed renal and hepatic cysts. Differential includes IBS,  food allergies, and potential H. pylori infection. - Test for H. pylori infection. - Consider further evaluation for IBS or other gastrointestinal disorders if symptoms persist.  Food Allergies Multiple food allergies with persistent symptoms. Potential immunotherapy discussed to expand dietary options. - Consider referral to an allergist for potential immunotherapy.  Hypertension Hypertension managed with valsartan . Weight management with semaglutide effective in reducing weight and possibly aiding blood pressure control. - Continue valsartan  80 mg. - Continue semaglutide for weight management.      I spent 45 minutes of total time managing this patient today, this includes chart review, face to face, and non-face to face time, reviewing outside records and labs and providing personal interpretation.   Return in about 6 weeks (around 02/02/2024).   Mandy Second, PA

## 2023-12-22 NOTE — Patient Instructions (Addendum)
 We obtained an H pylori test on you today.  Results will be available on Mychart.  I will communicate any further workup recommendations via Mychart.  Please stop the buproprion SR 100mg  and start the Wellbutrin 150mg  XR.  Please return for follow up in 6 weeks, sooner if needed.

## 2023-12-24 ENCOUNTER — Ambulatory Visit: Payer: Self-pay | Admitting: Urgent Care

## 2023-12-24 LAB — H. PYLORI BREATH TEST: H pylori Breath Test: NEGATIVE

## 2024-01-26 NOTE — Progress Notes (Signed)
 New Patient Note  RE: Kelsey [name] MRN: [id number] DOB: [birth year] Date of Office Visit: 01/27/2024  Consult requested by: Lowella Benton CROME, PA Primary care provider: Lowella Benton [place], [place]  Chief Complaint: Establish Care (Patient reports have had Igg tests done 2018 through [facility] and [facility] through [facility]. Was positive for yeast, gluten, pork, oak, chili peppers, dairy, casein, eggs. )  History of Present Illness: I had the pleasure of seeing Kelsey [name] for [facility]. She is a 48 y.o. female, who is referred here by Lowella Benton CROME, PA for the evaluation of food allergies.  Discussed the use of AI scribe software for clinical note transcription with the patient, who gave verbal consent to proceed.    She has experienced gastrointestinal symptoms since 2018, including bloating, gas, alternating constipation and diarrhea, and nausea. These symptoms have progressively worsened, leading to irregular bowel movements. Despite dietary modifications, such as reducing gluten and dairy intake, symptoms persist. A CT scan of the abdomen showed no inflammation, and she has not undergone a colonoscopy or EGD. She has been on a weight loss injection for a year and a half, now on a maintenance dose every few weeks, with side effects distinct from her ongoing gastrointestinal issues.  She experiences joint pain, neuropathic pain, and burning sensations primarily in larger joints such as knees, hips, shoulder blades, shoulders, and elbows. She tested positive for rheumatoid arthritis titer but was not started on specific medications as the condition was not deemed severe enough. There is speculation about the possibility of early-stage multiple sclerosis, as she has had MRIs of her brain showing multiple white matter lesions that were described as more than expected for her age. She is awaiting further evaluation by  neurology at [facility].  Her past medical history includes asthma diagnosed at age 70, managed with as-needed inhalers, and seasonal allergies, particularly in the spring, requiring Flonase  and daily allergy  medication. She has a deviated septum and a history of recurrent sinusitis, which has improved since the birth of her second daughter but has recently recurred. She occasionally experiences heartburn or reflux, for which she takes Prilosec or otc antacids prn.   She has a history of skin sensitivities and possible psoriasis-like breakouts, particularly around the scalp and forehead, which are painful and difficult to control. She has not been formally diagnosed with psoriasis.  No recent fevers, chills, or significant weight loss unrelated to her weight loss medication.     Patient had 2024 IgG food panel done with multiple positives which she has been avoiding with no changes.  No prior GI evaluation.  Dietary History: patient has been eating other foods including limited milk, limited eggs, peanut, treenuts, limited sesame, shellfish, fish, limited soy,  meats, fruits and vegetables.  12/22/2023 PCP visit: She experiences gastrointestinal symptoms, including alternating constipation and diarrhea, stomach cramps, and bloating. She has a history of heartburn and has not been tested for H. pylori. She has multiple food allergies, including wheat, rye, oat, pork, cheese, and eggs, and avoids these foods.     Assessment and Plan: Kelsey [name] is a 48 y.o. female with: Gastrointestinal complaints Food intolerance Chronic symptoms possibly due to food intolerances but symptoms not consistent with IgE mediated reaction. Differential includes IBS or IBD. Weight loss medication may contribute. Had IgG food panel done which is not clinically relevant. Patient tried to eliminate eggs, and cheese and gluten with no major benefit. Concerned about  food allergies and mast cell issues as well.  Discussed with  patient that skin prick testing and bloodwork (food IgE levels) checks for IgE mediated reactions which her clinical presentation does not support.  Keep a food journal with symptoms and foods eaten. See handout on FODMAP diet. Recommend GI evaluation next. Consider seeing Dr. Zac Spiritos who is functional gastroenterologist. Patient would like to rule out food allergies and will plan on select food testing. Will also get bloodwork to rule out alpha-gal and check tryptase level.   Gastroesophageal reflux disease, unspecified whether esophagitis present See handout for lifestyle and dietary modifications.  Mild intermittent asthma without complication Diagnosed at age 37 and using albuterol  as needed. Used to be on Symbicort  in the past.  Normal breathing test today.  May use Airsupra  rescue inhaler 2 puffs every 4 to 6 hours as needed for shortness of breath, chest tightness, coughing, and wheezing. Do not use more than 12 puffs in 24 hours. May use Airsupra  rescue inhaler 2 puffs 5 to 15 minutes prior to strenuous physical activities. Rinse mouth after each use.  Coupon given.  Monitor frequency of use - if you need to use it more than twice per week on a consistent basis let us  know.   Other allergic rhinitis Managed with Flonase  and antihistamines. No recent testing. Monitor symptoms. Consider environmental allergy  testing. Use over the counter antihistamines such as Zyrtec (cetirizine), Claritin (loratadine), Allegra (fexofenadine), or Xyzal (levocetirizine) daily as needed. May take twice a day during allergy  flares. May switch antihistamines every few months. Use Flonase  (fluticasone ) nasal spray 1-2 sprays per nostril once a day as needed for nasal congestion.  Nasal saline spray (i.e., Simply Saline) or nasal saline lavage (i.e., NeilMed) is recommended as needed and prior to medicated nasal sprays.  Recurrent infections Normal IgA, IgM and IgE levels. Recurrent sinusitis in the  past.  Keep track of infections and antibiotics use. If persistent will get bloodwork next to look at immune system.   Rash Keep track of rashes and take pictures. Write down what you had done during flares.  See below for proper skin care. Use fragrance free and dye free products. No dryer sheets or fabric softener.    Return for Skin testing.  Meds ordered this encounter  Medications   Albuterol -Budesonide  (AIRSUPRA ) 90-80 MCG/ACT AERO    Sig: Inhale 2 puffs into the lungs every 4 (four) hours as needed (coughing, wheezing, chest tightness). Do not exceed 12 puffs in 24 hours.    Dispense:  10.7 g    Refill:  2    BIN: [id number], PCN: [id number], GRP: [id number], ID [id number]   Lab Orders  No laboratory test(s) ordered today    Other allergy  screening: Asthma:  Diagnosed at age 31 and using albuterol  as needed. Used to be on Symbicort  in the past.   Rhino conjunctivitis: yes Mainly in the spring and takes Flonase  and antihistamines prn with good benefit. No prior AIT.   Medication allergy : yes Hymenoptera allergy : no Urticaria: no Eczema:no History of recurrent infections suggestive of immunodeficency: no  Diagnostics: Spirometry:  Tracings reviewed. Her effort: Good reproducible efforts. FVC: 3.51L FEV1: 2.95L, 110% predicted FEV1/FVC ratio: 84% Interpretation: Spirometry consistent with normal pattern.  Please see scanned spirometry results for details.  Results discussed with patient/family.   Past Medical History: Patient Active Problem List   Diagnosis Date Noted   Abnormal MRI, spine 12/22/2023   Abdominal bloating with cramps 12/22/2023   Abdominal pain, right lower quadrant  11/13/2023   Acute bacterial sinusitis 08/07/2023   Viral URI 08/07/2023   White matter disease 06/12/2023   Lymphadenopathy, cervical 06/12/2023   Arthralgia 04/30/2023   Hot flashes 04/30/2023   Pruritus 04/30/2023   B12 deficiency 04/30/2023   Acute lower respiratory infection  11/27/2022   Weight gain 11/23/2021   OSA (obstructive sleep apnea) 11/23/2021   Vomiting without nausea 10/19/2020   Iron deficiency anemia 10/19/2020   Gastroesophageal reflux disease 10/19/2020   Change in bowel habit 10/19/2020   Sinus problem    Bipolar disorder (HCC)    Anemia    Daytime somnolence 04/19/2020   Symptomatic PVCs 04/19/2020   History of COVID-19 03/16/2020   Other fatigue 02/25/2020   Palpitations 02/25/2020   Essential hypertension 02/25/2020   Family history of long QT syndrome 02/25/2020   Rheumatoid factor positive 03/23/2018   Acquired hypothyroidism 02/12/2018   Asthma with allergic rhinitis 12/12/2014   Vitamin D  deficiency 12/12/2014   Nevus of finger 09/14/2013   Migraine headache 06/21/2013   Bulimia nervosa 06/21/2013   Routine general medical examination at a health care facility 05/13/2012   Contact dermatitis 05/04/2012   Bunion of great toe 12/26/2010   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/2010   Allergic rhinitis 07/16/2010   MYALGIA 06/25/2010   VERTIGO 05/07/2010   LOW BACK PAIN SYNDROME 03/02/2010   NEOPLASM OF UNCERTAIN BEHAVIOR OF SKIN 10/03/2008   Pyelonephritis 2008   Past Medical History:  Diagnosis Date   Abnormal Pap smear    Anemia    Asthma 1996   Bipolar disorder (HCC)    Chronic sinusitis    GERD (gastroesophageal reflux disease) 2009   Headache(784.0)    Hypertension 2021   Pyelonephritis 2008   Pylo and stones in the past   Sinus problem    sinusitis   Thyroid  disease 2017   Past Surgical History: Past Surgical History:  Procedure Laterality Date   CESAREAN SECTION     CESAREAN SECTION  08/31/2011   Procedure: CESAREAN SECTION;  Surgeon: Burnard A. Kandyce, MD;  Location: [facility];  Service: Gynecology;  Laterality: [name];  Repeat cesarean section with delivery of baby    COLPOSCOPY     KNEE ARTHROSCOPY     right knee   WISDOM TOOTH EXTRACTION     Medication List:  Current Outpatient Medications  Medication Sig  Dispense Refill   albuterol  (VENTOLIN  HFA) 108 (90 Base) MCG/ACT inhaler Inhale 2 puffs into the lungs as needed for wheezing or shortness of breath.     Albuterol -Budesonide  (AIRSUPRA ) 90-80 MCG/ACT AERO Inhale 2 puffs into the lungs every 4 (four) hours as needed (coughing, wheezing, chest tightness). Do not exceed 12 puffs in 24 hours. 10.7 g 2   Betamethasone  Valerate 0.12 % foam USE DAILY AS DIRECTED 100 g 0   budesonide -formoterol  (SYMBICORT ) 160-4.5 MCG/ACT inhaler Inhale 2 puffs into the lungs 2 (two) times daily. 6 g 3   buPROPion  (WELLBUTRIN  XL) 150 MG 24 hr tablet Take 1 tablet (150 mg total) by mouth every morning. 90 tablet 0   cyanocobalamin  (VITAMIN B12) 1000 MCG/ML injection Inject 1 mL (1,000 mcg total) into the muscle every 30 (thirty) days. 10 mL 5   DULoxetine (CYMBALTA) 60 MG capsule Take 30 mg by mouth daily. (Patient taking differently: Take 60 mg by mouth daily.)     estradiol  (ESTRACE ) 1 MG tablet Take 1 tablet (1 mg total) by mouth daily. 90 tablet 3   ketoconazole (NIZORAL) 2 % shampoo APPLY 5-10ML TO WET  SCALP. LATHER AND LEAVE ON FOR 3-5 MINUTES THEN RINSE. USE TWICE WEEKLY FOR 2-4 WEEKS     modafinil  (PROVIGIL ) 200 MG tablet Take 1 tablet (200 mg total) by mouth daily. 90 tablet 3   Semaglutide-Weight Management 1 MG/0.5ML SOAJ Inject into the skin.     triamcinolone  (KENALOG ) 0.025 % ointment Apply 1 Application topically 2 (two) times daily. 30 g 0   valACYclovir  (VALTREX ) 500 MG tablet Take 1 tablet (500 mg total) by mouth daily as needed. 90 tablet 3   valsartan  (DIOVAN ) 80 MG tablet Take 1 tablet (80 mg total) by mouth 2 (two) times daily. (Patient taking differently: Take 80 mg by mouth daily.) 180 tablet 3   Vitamin D , Ergocalciferol , (DRISDOL ) 1.25 MG (50000 UNIT) CAPS capsule Take 1 capsule (50,000 Units total) by mouth every 7 (seven) days. 20 capsule 0   No current facility-administered medications for this visit.   Allergies: Allergies  Allergen  Reactions   Doxycycline  Nausea And Vomiting   Morphine  Other (See Comments)   Morphine  And Codeine Hives   Morphine  Sulfate Other (See Comments)   Other Other (See Comments)   Sulfa Antibiotics Nausea And Vomiting   Oseltamivir  Nausea And Vomiting and Other (See Comments)   Social History: Social History   Socioeconomic History   Marital status: Married    Spouse name: Not on file   Number of children: 2   Years of education: 18   Highest education level: Master's degree (e.g., MA, MS, MEng, MEd, MSW, MBA)  Occupational History   Occupation: clinical Hospital doctor: SELF-EMPLOYED  Tobacco Use   Smoking status: Never   Smokeless tobacco: Never  Vaping Use   Vaping status: Never Used  Substance and Sexual Activity   Alcohol use: No   Drug use: No   Sexual activity: Yes    Birth control/protection: I.U.D.  Other Topics Concern   Not on file  Social History Narrative   1 daughter--Ella Ronnald born 12/09, 1 step son   Right handed   Works in social work   Occasionally caffeine   2 Administrator degree   Social Drivers of Corporate investment banker Strain: Low Risk  (08/14/2023)   Received from [employer]   Overall Financial Resource Strain (CARDIA)    Difficulty of Paying Living Expenses: Not very hard  Food Insecurity: No Food Insecurity (08/14/2023)   Received [facility]   Hunger Vital Sign    Within the past 12 months, you worried that your food would run out before you got the money to buy more.: Never true    Within the past 12 months, the food you bought just didn't last and you didn't have money to get more.: Never true  Transportation Needs: No Transportation Needs (08/14/2023)   Received from [facility] - Transportation    Lack of Transportation (Medical): No    Lack of Transportation (Non-Medical): No  Physical Activity: Insufficiently Active (08/14/2023)   Received from [facility]   Exercise Vital Sign    On average,  how many days per week do you engage in moderate to strenuous exercise (like a brisk walk)?: 2 days    On average, how many minutes do you engage in exercise at this level?: 30 min  Stress: No Stress Concern Present (08/14/2023)   Received from [facility] - Occupational Stress Questionnaire    Feeling of Stress :  Not at all  Social Connections: Socially Integrated (08/14/2023)   Received from [facility]   Social Network    How would you rate your social network (family, work, friends)?: Good participation with social networks   Lives in a house. Smoking: denies Occupation: Emergency planning/management officer HistorySurveyor, minerals in the house: not sure Carpet in the family room: no Carpet in the bedroom: yes Heating: gas Cooling: central Pet: yes 2 cats and 3 dogs  Family History: Family History  Problem Relation Age of Onset   Heart disease Brother        sudden cardiac death   Asthma Brother    Early death Brother    Heart disease Maternal Grandfather 55       MI   Coronary artery disease Maternal Grandfather    Diabetes Maternal Grandfather    Kidney disease Maternal Grandfather    Heart disease Mother    Depression Mother    Diabetes Mother    Alcohol abuse Paternal Grandfather    Cancer Paternal Grandfather    Cancer Paternal Grandmother    Stroke Neg Hx    Hypertension Neg Hx    Review of Systems  Constitutional:  Positive for fatigue. Negative for appetite change, chills, fever and unexpected weight change.  HENT:  Negative for congestion and rhinorrhea.   Eyes:  Negative for itching.  Respiratory:  Negative for cough, chest tightness, shortness of breath and wheezing.   Cardiovascular:  Negative for chest pain.  Gastrointestinal:  Positive for abdominal pain, constipation, diarrhea and nausea.  Genitourinary:  Negative for difficulty urinating.  Musculoskeletal:  Positive for arthralgias.  Skin:   Positive for rash.  Neurological:  Negative for headaches.    Objective: BP 124/84 (BP Location: Left Arm, Patient Position: Sitting, Cuff Size: Normal)   Pulse (!) 120   Temp 98.3 F (36.8 C) (Temporal)   Resp 18   Ht 5' 2.56 (1.589 m)   Wt 141 lb (64 kg)   SpO2 97%   BMI 25.33 kg/m  Body mass index is 25.33 kg/m. Physical Exam Vitals and nursing note reviewed.  Constitutional:      Appearance: Normal appearance. She is well-developed.  HENT:     Head: Normocephalic and atraumatic.     Right Ear: Tympanic membrane and external ear normal.     Left Ear: Tympanic membrane and external ear normal.     Nose: Nose normal.     Mouth/Throat:     Mouth: Mucous membranes are moist.     Pharynx: Oropharynx is clear.  Eyes:     Conjunctiva/sclera: Conjunctivae normal.  Cardiovascular:     Rate and Rhythm: Normal rate and regular rhythm.     Heart sounds: Normal heart sounds. No murmur heard.    No friction rub. No gallop.  Pulmonary:     Effort: Pulmonary effort is normal.     Breath sounds: Normal breath sounds. No wheezing, rhonchi or rales.  Musculoskeletal:     Cervical back: Neck supple.  Skin:    General: Skin is warm.     Findings: No rash.  Neurological:     Mental Status: She is alert and oriented to person, place, and time.  Psychiatric:        Behavior: Behavior normal.    The plan was reviewed with the patient/family, and all questions/concerned were addressed.  It was my pleasure to see Merlean today and participate in her care. Please feel free to contact me with  any questions or concerns.  Sincerely,  Orlan Cramp, DO Allergy  & Immunology  [facility] [place]  [place] office: [phone] [facility] office: [phone]  Total Time: 60 minutes Time spent on day of service preparing to see patient; obtaining and reviewing separately obtained history;  performing examination; counseling and educating patient and family; ordering  medications, tests and procedures; documenting clinical information in the health record; independently interpreting results and communicating to patient/family.

## 2024-01-27 ENCOUNTER — Ambulatory Visit (INDEPENDENT_AMBULATORY_CARE_PROVIDER_SITE_OTHER): Payer: Self-pay | Admitting: Allergy

## 2024-01-27 ENCOUNTER — Other Ambulatory Visit: Payer: Self-pay

## 2024-01-27 ENCOUNTER — Encounter: Payer: Self-pay | Admitting: Allergy

## 2024-01-27 VITALS — BP 124/84 | HR 120 | Temp 98.3°F | Resp 18 | Ht 62.56 in | Wt 141.0 lb

## 2024-01-27 DIAGNOSIS — K219 Gastro-esophageal reflux disease without esophagitis: Secondary | ICD-10-CM

## 2024-01-27 DIAGNOSIS — J4520 Mild intermittent asthma, uncomplicated: Secondary | ICD-10-CM

## 2024-01-27 DIAGNOSIS — R198 Other specified symptoms and signs involving the digestive system and abdomen: Secondary | ICD-10-CM | POA: Diagnosis not present

## 2024-01-27 DIAGNOSIS — B999 Unspecified infectious disease: Secondary | ICD-10-CM | POA: Diagnosis not present

## 2024-01-27 DIAGNOSIS — K9049 Malabsorption due to intolerance, not elsewhere classified: Secondary | ICD-10-CM

## 2024-01-27 DIAGNOSIS — J3089 Other allergic rhinitis: Secondary | ICD-10-CM

## 2024-01-27 DIAGNOSIS — R21 Rash and other nonspecific skin eruption: Secondary | ICD-10-CM

## 2024-01-27 MED ORDER — AIRSUPRA 90-80 MCG/ACT IN AERO
2.0000 | INHALATION_SPRAY | RESPIRATORY_TRACT | 2 refills | Status: DC | PRN
Start: 2024-01-27 — End: 2024-02-10

## 2024-01-27 NOTE — Patient Instructions (Addendum)
 Food  Discussed with patient that skin prick testing and bloodwork (food IgE levels) checks for IgE mediated reactions which her clinical presentation does not support.  Keep a food journal with symptoms and foods eaten. See handout on FODMAP diet.  Allergy : food allergy  is when you have eaten a food, developed an allergic reaction after eating the food and have IgE to the food (positive food testing either by skin testing or blood testing).  Food allergy  could lead to life threatening symptoms Sensitivity: occurs when you have IgE to a food (positive food testing either by skin testing or blood testing) but is a food you eat without any issues.  This is not an allergy  and we recommend keeping the food in the diet Intolerance: this is when you have negative testing by either skin testing or blood testing thus not allergic but the food causes symptoms (like belly pain, bloating, diarrhea etc) with ingestion.  These foods should be avoided to prevent symptoms.    Recommend GI evaluation next.  Consider seeing Dr. Zac Spiritos [email or web address]  Skin testing instructions Make sure you don't take any antihistamines for 3 days before the skin testing appointment. Don't put any lotion on the back and arms on the day of testing.  Must be in good health and not ill. No vaccines/injections/antibiotics within the past 7 days.  Plan on being here for 30-60 minutes.  Reflux See handout for lifestyle and dietary modifications.  Asthma Normal breathing test today.  May use Airsupra  rescue inhaler 2 puffs every 4 to 6 hours as needed for shortness of breath, chest tightness, coughing, and wheezing. Do not use more than 12 puffs in 24 hours. May use Airsupra  rescue inhaler 2 puffs 5 to 15 minutes prior to strenuous physical activities. Rinse mouth after each use.  Coupon given.  Monitor frequency of use - if you need to use it more than twice per week on a consistent  basis let us  know.  Breathing control goals:  Full participation in all desired activities (may need albuterol  before activity) Albuterol  use two times or less a week on average (not counting use with activity) Cough interfering with sleep two times or less a month Oral steroids no more than once a year No hospitalizations   Environmental allergies Monitor symptoms. Consider environmental allergy  testing. Use over the counter antihistamines such as Zyrtec (cetirizine), Claritin (loratadine), Allegra (fexofenadine), or Xyzal (levocetirizine) daily as needed. May take twice a day during allergy  flares. May switch antihistamines every few months. Use Flonase  (fluticasone ) nasal spray 1-2 sprays per nostril once a day as needed for nasal congestion.  Nasal saline spray (i.e., Simply Saline) or nasal saline lavage (i.e., NeilMed) is recommended as needed and prior to medicated nasal sprays.  Infections Keep track of infections and antibiotics use. If persistent will get bloodwork next to look at immune system.   Skin Keep track of rashes and take pictures. Write down what you had done during flares.  See below for proper skin care. Use fragrance free and dye free products. No dryer sheets or fabric softener.      Return for Skin testing. Or sooner if needed.    Skin care recommendations  Bath time: Always use lukewarm water. AVOID very hot or cold water. Keep bathing time to 5-10 minutes. Do NOT use bubble bath. Use a mild soap and use just enough to wash the dirty areas. Do NOT scrub skin vigorously.  After bathing, pat dry your skin with a towel.  Do NOT rub or scrub the skin.  Moisturizers and prescriptions:  ALWAYS apply moisturizers immediately after bathing (within 3 minutes). This helps to lock-in moisture. Use the moisturizer several times a day over the whole body. Good summer moisturizers include: Aveeno, CeraVe, Cetaphil. Good winter moisturizers include: Aquaphor,  Vaseline, Cerave, Cetaphil, Eucerin, Vanicream. When using moisturizers along with medications, the moisturizer should be applied about one hour after applying the medication to prevent diluting effect of the medication or moisturize around where you applied the medications. When not using medications, the moisturizer can be continued twice daily as maintenance.  Laundry and clothing: Avoid laundry products with added color or perfumes. Use unscented hypo-allergenic laundry products such as Tide free, Cheer free & gentle, and All free and clear.  If the skin still seems dry or sensitive, you can try double-rinsing the clothes. Avoid tight or scratchy clothing such as wool. Do not use fabric softeners or dyer sheets.

## 2024-02-02 ENCOUNTER — Ambulatory Visit (INDEPENDENT_AMBULATORY_CARE_PROVIDER_SITE_OTHER): Admitting: Urgent Care

## 2024-02-02 VITALS — BP 144/93 | HR 106 | Ht 62.5 in | Wt 145.0 lb

## 2024-02-02 DIAGNOSIS — R140 Abdominal distension (gaseous): Secondary | ICD-10-CM

## 2024-02-02 DIAGNOSIS — R937 Abnormal findings on diagnostic imaging of other parts of musculoskeletal system: Secondary | ICD-10-CM

## 2024-02-02 DIAGNOSIS — R5383 Other fatigue: Secondary | ICD-10-CM

## 2024-02-02 DIAGNOSIS — R9082 White matter disease, unspecified: Secondary | ICD-10-CM

## 2024-02-02 DIAGNOSIS — R109 Unspecified abdominal pain: Secondary | ICD-10-CM

## 2024-02-02 MED ORDER — HYOSCYAMINE SULFATE 0.125 MG SL SUBL: 0.1250 mg | SUBLINGUAL_TABLET | SUBLINGUAL | 3 refills | Status: AC | PRN

## 2024-02-02 NOTE — Patient Instructions (Addendum)
 [facility] - call them today [address] [place], [place] [address] Phone: [phone]  Levsin  sublingual - every 4hours as needed for spasms/ cramping /bloating.

## 2024-02-02 NOTE — Progress Notes (Unsigned)
   Established Patient Office Visit  Subjective:  Patient ID: Kelsey [name], female    DOB: 1976-02-28  Age: 48 y.o. MRN: [id number]  Chief Complaint  Patient presents with   Follow-up    HPI  {History (Optional):23778}  ROS: as noted in HPI  Objective:     BP (!) 144/93   Pulse (!) 106   Ht 5' 2.5 (1.588 m)   Wt 145 lb (65.8 kg)   SpO2 99%   BMI 26.10 kg/m  {Vitals History (Optional):23777}  Physical Exam   No results found for any visits on 02/02/24.  {Labs (Optional):23779}  The 10-year ASCVD risk score (Arnett DK, et al., 2019) is: 1%  Assessment & Plan:  There are no diagnoses linked to this encounter.   No follow-ups on file.   Benton LITTIE Gave, PA

## 2024-02-09 ENCOUNTER — Other Ambulatory Visit: Payer: Self-pay | Admitting: Allergy

## 2024-02-10 NOTE — Telephone Encounter (Signed)
 Sent in albuterol

## 2024-02-13 ENCOUNTER — Ambulatory Visit: Payer: Self-pay | Admitting: Physical Medicine and Rehabilitation

## 2024-02-13 ENCOUNTER — Ambulatory Visit
Admission: RE | Admit: 2024-02-13 | Discharge: 2024-02-13 | Disposition: A | Discharge status: Home / Self Care | Source: Ambulatory Visit | Attending: Physician Assistant | Admitting: Physician Assistant

## 2024-02-13 DIAGNOSIS — I776 Arteritis, unspecified: Secondary | ICD-10-CM

## 2024-02-17 ENCOUNTER — Ambulatory Visit (INDEPENDENT_AMBULATORY_CARE_PROVIDER_SITE_OTHER): Admitting: Allergy

## 2024-02-17 ENCOUNTER — Encounter: Payer: Self-pay | Admitting: Allergy

## 2024-02-17 DIAGNOSIS — J4520 Mild intermittent asthma, uncomplicated: Secondary | ICD-10-CM

## 2024-02-17 DIAGNOSIS — R198 Other specified symptoms and signs involving the digestive system and abdomen: Secondary | ICD-10-CM

## 2024-02-17 DIAGNOSIS — K9049 Malabsorption due to intolerance, not elsewhere classified: Secondary | ICD-10-CM

## 2024-02-17 DIAGNOSIS — R21 Rash and other nonspecific skin eruption: Secondary | ICD-10-CM

## 2024-02-17 DIAGNOSIS — J3089 Other allergic rhinitis: Secondary | ICD-10-CM

## 2024-02-17 DIAGNOSIS — K219 Gastro-esophageal reflux disease without esophagitis: Secondary | ICD-10-CM

## 2024-02-17 DIAGNOSIS — T781XXD Other adverse food reactions, not elsewhere classified, subsequent encounter: Secondary | ICD-10-CM | POA: Diagnosis not present

## 2024-02-17 DIAGNOSIS — B999 Unspecified infectious disease: Secondary | ICD-10-CM

## 2024-02-17 NOTE — Patient Instructions (Addendum)
 Today's skin testing borderline to hops only. Negative to other select foods.   Results given.  Food  Discussed with patient that skin prick testing and bloodwork (food IgE levels) checks for IgE mediated reactions which her clinical presentation does not support.  Keep a food journal with symptoms and foods eaten. Try FODMAP diet.  Allergy : food allergy  is when you have eaten a food, developed an allergic reaction after eating the food and have IgE to the food (positive food testing either by skin testing or blood testing).  Food allergy  could lead to life threatening symptoms Sensitivity: occurs when you have IgE to a food (positive food testing either by skin testing or blood testing) but is a food you eat without any issues.  This is not an allergy  and we recommend keeping the food in the diet Intolerance: this is when you have negative testing by either skin testing or blood testing thus not allergic but the food causes symptoms (like belly pain, bloating, diarrhea etc) with ingestion.  These foods should be avoided to prevent symptoms.    Recommend GI evaluation next.  Consider seeing Dr. Zac Spiritos [email or web address]  Reflux Continue lifestyle and dietary modifications.  Asthma May use Airsupra  rescue inhaler 2 puffs every 4 to 6 hours as needed for shortness of breath, chest tightness, coughing, and wheezing. Do not use more than 12 puffs in 24 hours. May use Airsupra  rescue inhaler 2 puffs 5 to 15 minutes prior to strenuous physical activities. Rinse mouth after each use.  Monitor frequency of use - if you need to use it more than twice per week on a consistent basis let us  know.  Breathing control goals:  Full participation in all desired activities (may need albuterol  before activity) Albuterol  use two times or less a week on average (not counting use with activity) Cough interfering with sleep two times or less a month Oral steroids no  more than once a year No hospitalizations   Environmental allergies Monitor symptoms. Consider environmental allergy  testing. Use over the counter antihistamines such as Zyrtec (cetirizine), Claritin (loratadine), Allegra (fexofenadine), or Xyzal (levocetirizine) daily as needed. May take twice a day during allergy  flares. May switch antihistamines every few months. Use Flonase  (fluticasone ) nasal spray 1-2 sprays per nostril once a day as needed for nasal congestion.  Nasal saline spray (i.e., Simply Saline) or nasal saline lavage (i.e., NeilMed) is recommended as needed and prior to medicated nasal sprays.  Infections Keep track of infections and antibiotics use. Get bloodwork next to look at immune system.   Skin Keep track of rashes and take pictures. Write down what you had done during flares.  Continue proper skin care.     Get bloodwork to rule out mast cell disorder.  We are ordering labs, so please allow 1-2 weeks for the results to come back. With the newly implemented Cures Act, the labs might be visible to you at the same time that they become visible to me. However, I will not address the results until all of the results are back, so please be patient.  In the meantime, continue recommendations in your patient instructions, including avoidance measures (if applicable), until you hear from me.  Follow up depending on lab results.   Skin care recommendations  Bath time: Always use lukewarm water. AVOID very hot or cold water. Keep bathing time to 5-10 minutes. Do NOT use bubble bath. Use a mild soap and use just enough to wash the dirty areas. Do NOT scrub  skin vigorously.  After bathing, pat dry your skin with a towel. Do NOT rub or scrub the skin.  Moisturizers and prescriptions:  ALWAYS apply moisturizers immediately after bathing (within 3 minutes). This helps to lock-in moisture. Use the moisturizer several times a day over the whole body. Good summer  moisturizers include: Aveeno, CeraVe, Cetaphil. Good winter moisturizers include: Aquaphor, Vaseline, Cerave, Cetaphil, Eucerin, Vanicream. When using moisturizers along with medications, the moisturizer should be applied about one hour after applying the medication to prevent diluting effect of the medication or moisturize around where you applied the medications. When not using medications, the moisturizer can be continued twice daily as maintenance.  Laundry and clothing: Avoid laundry products with added color or perfumes. Use unscented hypo-allergenic laundry products such as Tide free, Cheer free & gentle, and All free and clear.  If the skin still seems dry or sensitive, you can try double-rinsing the clothes. Avoid tight or scratchy clothing such as wool. Do not use fabric softeners or dyer sheets.

## 2024-02-17 NOTE — Progress Notes (Signed)
 Skin testing note  RE: Kelsey [name] MRN: [id number] DOB: [birth year] Date of Office Visit: 02/17/2024  Referring provider: Lowella Benton [place], [place] Primary care provider: Lowella Benton [place], [place]  Chief Complaint: skin testing  History of Present Illness: I had the pleasure of seeing [name] for a skin testing visit at the [facility] on 02/17/2024. She is a 48 y.o. female, who is being followed for food intolerances, GERD, asthma, allergic rhinitis, recurrent infections and rash. Her previous allergy  office visit was on 01/27/2024 with Dr. Luke. Today is a skin testing visit.   Discussed the use of AI scribe software for clinical note transcription with the patient, who gave verbal consent to proceed.    She suspects potential allergies to wheat, gluten, eggs, dairy, yeast, oats, rice, barley, rye, hops, pork, soy, sesame, peanuts, seafood, salmon, cashews, garlic, and white potatoes, noting frequent consumption of these foods.  She has a history of frequent sinus infections. Previous blood work included IgA, IgM, and IgE. She underwent a 24-hour urine mast cell test but is unsure if tryptase levels were checked.  In July, she had extensive blood work done at the [facility].     Assessment and Plan: [name] is a 48 y.o. female with: Gastrointestinal complaints Food intolerance Past history - Chronic symptoms possibly due to food intolerances but symptoms not consistent with IgE mediated reaction. Differential includes IBS or IBD. Weight loss medication may contribute. Had IgG food panel done which is not clinically relevant. Patient tried to eliminate eggs, and cheese and gluten with no major benefit. Concerned about food allergies and mast cell issues as well.  Today's skin prick testing borderline to hops only. Negative to other select foods.  Discussed with patient that skin prick testing and bloodwork (food IgE levels) checks for IgE mediated  reactions which her clinical presentation does not support.  Keep a food journal with symptoms and foods eaten. Try FODMAP diet. Recommend GI evaluation next. Consider seeing Dr. Zac Spiritos who is a functional GI specialist.  Get bloodwork to rule out mast cell disorder - per patient's concerns.   Gastroesophageal reflux disease, unspecified whether esophagitis present Continue lifestyle and dietary modifications.   Mild intermittent asthma without complication Past history - diagnosed at age 74 and using albuterol  as needed. Used to be on Symbicort  in the past. 2025 spirometry normal. May use Airsupra  rescue inhaler 2 puffs every 4 to 6 hours as needed for shortness of breath, chest tightness, coughing, and wheezing. Do not use more than 12 puffs in 24 hours. May use Airsupra  rescue inhaler 2 puffs 5 to 15 minutes prior to strenuous physical activities. Rinse mouth after each use.  Monitor frequency of use - if you need to use it more than twice per week on a consistent basis let us  know.    Other allergic rhinitis Past history - Managed with Flonase  and antihistamines. No recent testing. Monitor symptoms. Consider environmental allergy  testing. Use over the counter antihistamines such as Zyrtec (cetirizine), Claritin (loratadine), Allegra (fexofenadine), or Xyzal (levocetirizine) daily as needed. May take twice a day during allergy  flares. May switch antihistamines every few months. Use Flonase  (fluticasone ) nasal spray 1-2 sprays per nostril once a day as needed for nasal congestion.  Nasal saline spray (i.e., Simply Saline) or nasal saline lavage (i.e., NeilMed) is recommended as needed and prior to medicated nasal sprays.   Recurrent infections Past history - Normal IgA, IgM and IgE levels. Recurrent sinusitis  in the past.  Keep track of infections and antibiotics use. Get bloodwork next to look at immune system.    Rash Keep track of rashes and take pictures. Write down what you  had done during flares.  Continue proper skin care.  Follow up depending on lab results.   No orders of the defined types were placed in this encounter.  Lab Orders         Alpha-Gal Panel         Tryptase         Strep pneumoniae 23 Serotypes IgG         Complement, total         Diphtheria / Tetanus Antibody Panel         IgG      Diagnostics: Skin Testing: Select foods. Today's skin testing borderline to hops only. Negative to other select foods.  Results discussed with patient/family.  Food Adult Perc - 02/17/24 1300     Time Antigen Placed 1354    Allergen Manufacturer Jestine    Location Back    Number of allergen test 21     Control-buffer 50% Glycerol Negative    Control-Histamine 3+    1. Peanut Negative    2. Soybean Negative    3. Wheat Negative    4. Sesame Negative    5. Milk, Cow Negative    6. Casein Negative    7. Egg White, Chicken Negative    8. Shellfish Mix Negative    9. Fish Mix Negative    10. Cashew Negative    20. Salmon Negative    28. Oat  Negative    29. Rice Negative    30. Barley Negative    31. Rye  Negative    32. Hops --   +/-   35. Pork Negative    39. White Potato Negative    70. Garlic Negative          Previous notes and tests were reviewed. The plan was reviewed with the patient/family, and all questions/concerned were addressed.  It was my pleasure to see Kelsey [name] today and participate in her care. Please feel free to contact me with any questions or concerns.  Sincerely,  Orlan Cramp, DO Allergy  & Immunology  [facility]  [place] office: [phone] [facility] office: [phone]

## 2024-02-20 NOTE — Progress Notes (Signed)
 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 MRI shows white matter changes, and lumbar puncture indicates neuroinflammation. Symptoms are somewhat consistent with long COVID but have atypical features, including extensive neurological symptoms and progressive nature. Differential includes other neurological conditions such as MS. - Continue low dose naltrexone, increase dose as tolerated. - Consider trial of gabapentin for neuropathic pain, starting at 100 mg up to three times a day, with potential to increase dose. - Discuss potential trial of hydroxychloroquine (Plaquenil) with cardiology due to QT prolongation risk. - Encourage follow-up with neurology for further evaluation, including EMG and small fiber neuropathy biopsy. - Consider IVIG if small fiber neuropathy is confirmed.  White matter changes and neuroinflammation (brain MRI and CSF findings) Brain MRI shows white matter changes, and CSF analysis indicates neuroinflammation. These findings are atypical for long COVID and suggest possible other neurological conditions. Differential includes MS and other autoimmune or demyelinating conditions. - Follow up with neurology for further evaluation of white matter changes and neuroinflammation. - Consider EMG and small fiber neuropathy biopsy as part of the neurological workup.  Hypertension and tachycardia (post-COVID onset) Hypertension and tachycardia developed post-COVID-19 infection as part of the post-acute sequelae of COVID-19. - Discuss with cardiology the potential use of hydroxychloroquine (Plaquenil) due to QT prolongation risk.  Syncope  (post-COVID) Occasional syncopal episodes post-COVID-19 infection as part of the post-acute sequelae of COVID-19. - Discuss with cardiology if needed.  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 Evaluated by gastroenterology, with upcoming appointment at [facility]. - Attend scheduled appointment with Dr. Derick Bring at [facility] for further evaluation. - Consider endoscopy or colonoscopy as recommended by gastroenterology.  Progressive vision deterioration Progressive vision deterioration with increased need for stronger reading glasses. Possible neurological component given other symptoms. - Schedule appointment with a general ophthalmologist for initial evaluation. - Consider referral to neuro-ophthalmologist if initial evaluation is inconclusive.  History of Lyme disease (under evaluation by infectious disease) Previous abnormal test in 2017. - Attend scheduled appointment with Dr. Lavina for infectious disease evaluation.  History of bulimia nervosa in remission Currently in remission. Concerns about weight gain with certain medications due to past history. - Monitor weight and discuss any concerns with medications that may affect weight. - Consider gabapentin over Lyrica due to lower risk of weight gain.  Return in about 3 months (around 05/22/2024) for telemedicine visit.  SUBJECTIVE   History of Present Illness Kelsey [name] is a 48 year old female who presents with persistent fatigue and chronic pain for follow-up. She was referred for suspicion of long COVID due to persistent fatigue and chronic pain.  She experiences persistent fatigue and chronic pain, particularly arthralgias in the knees and hips, and neuropathy in the neck and shoulders. These symptoms began after contracting COVID-19 in July 2021, along with cardiac symptoms such as hypertension, tachycardia, and occasional  syncopal episodes.  She reports progressive cognitive dysfunction, including memory loss, word-finding difficulties, concentration issues, vision deterioration, and balance imbalances. A brain MRI has shown white matter changes, and a lumbar puncture reportedly showed inflammation.  She has a history of gastrointestinal complaints since 2017, including stomach pain and changes in bowel habits with mixed constipation and diarrhea, as well  as evolving food allergies.  She experiences progressive neurological symptoms, including tingling or vibration in the right groin area and intermittent rib pain on both sides, which have started in the last month. She continues to have unusual neurological symptoms.  She is currently taking low dose naltrexone, having increased to 3 mg. Initially, she experienced difficulty sleeping and stomach issues, but these have subsided. She is also on 60 mg of duloxetine.  She has a history of an abnormal Lyme test in 2017 and mold exposure, which she feels has contributed to her symptoms.  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  Current Medications[1]   Screening Tests    PHQ-8 Score:    GAD-7 Score: GAD7 Total Score GAD-7 Total Score  01/13/2024  2:13 PM 3 *    * Patient-reported                 OBJECTIVE   Physical Exam Constitutional:      General: She is not in acute distress.    Appearance: Normal appearance. She is not ill-appearing.  HENT:     Head: Normocephalic.  Pulmonary:     Effort: Pulmonary effort is normal. No respiratory distress.  Neurological:     Mental Status: She is alert and oriented to person, place, and time.  Psychiatric:        Attention and Perception: Attention and perception normal.        Mood and Affect: Mood normal.        Speech: Speech normal.        Behavior: Behavior normal.        Thought Content: Thought content normal.         Cognition and Memory: Memory normal. Cognition is impaired (word finding).        Judgment: Judgment normal.      VISIT TIME   The patient reports they are physically located in  . Visit type: video. I spent 67m 42s on the call with the patient. I spent an additional 20 minutes on pre- and post-visit activities on the date of service.        [1]  Current Outpatient Medications:  .  buPROPion  (WELLBUTRIN  XL) 150 MG 24 hr tablet, Take 1 tablet (150 mg total) by mouth every morning., Disp: , Rfl:  .  cyanocobalamin , vitamin B-12, 1,000 mcg/mL injection, Inject 1 mL (1,000 mcg total) into the muscle every thirty (30) days., Disp: , Rfl:  .  DULoxetine (CYMBALTA) 30 MG capsule, Take 1 capsule (30 mg total) by mouth daily., Disp: , Rfl:  .  estradiol  (ESTRACE ) 1 MG tablet, Take 1 tablet (1 mg total) by mouth daily., Disp: , Rfl:  .  gabapentin (NEURONTIN) 100 MG capsule, Take 1 capsule (100 mg total) by mouth Three (3) times a day as needed (for pain)., Disp: 90 capsule, Rfl: 0 .  modafinil  (PROVIGIL ) 200 MG tablet, Take 1 tablet (200 mg total) by mouth daily., Disp: , Rfl:  .  naltrexone, bulk, 100 % Powd, Take 1.5 mg by mouth nightly for 15 days, THEN 3 mg nightly for 15 days, THEN 4.5 mg nightly., Disp: 0.01 g, Rfl: 1 .  valACYclovir  (VALTREX ) 500 MG tablet, Take 1 tablet (500 mg total) by mouth as needed., Disp: , Rfl:  .  valsartan  (DIOVAN ) 40 MG tablet, Take 2 tablets (80 mg total) by mouth daily., Disp: , Rfl:  *Some images could not  be shown.

## 2024-02-23 ENCOUNTER — Ambulatory Visit: Payer: Self-pay | Admitting: Neurology

## 2024-03-04 NOTE — Progress Notes (Unsigned)
 Subjective:    Patient ID: Kelsey [name], female    DOB: [birth year], 48 y.o.   MRN: [id number]  HPI [name] is a 48 year old woman who presents for f/u of chronic pain, anxiety.  1) Chronic pain -pain has been well controlled with some flucuations -pain has increased, would be interested in increased low dose naltrexone -taking magnesium and tart cherry juice -she can tell when she does not take the magnesium and potassium -pain has been better with naltrexone and cymbalta -tested positive for EBV and some of the banners of Lyme Disease -she was seeing an integrative medicine center -she tested positive for RA -she saw a rheumatologist and had Xrs done -pains started in 2017 and feels like a burning sensation -has not yet heard from [facility] regarding her LDN -willing to take additional vitamin D  supplement  2) Fatigue Modafinil  helps -she sometimes needs afternoon naps -this has been the big thing -her PCP recommends wellbutrin  -she has been to two different sleep clinics -she went to the Sleep center in [place] and both were negative -she asks about going up to 200mg  daily -sleep has not worsened since starting modafinil   3) Anxiety: -has been having stressors after her divorce with her ex-husband trying to gain more custody of her daughter. -she has seen a psychiatrist to deal with this. -she switched her Celexa  to Cymbalta -when she had a break from her husband and was able to get off the medication and feel fine -started seeing a new psychiatrist in [place] and suggested trying the Cymbalta -she has been working on [place] therapy and grounding  -has been seeing a psychiatrist Dr. Randine Flavors who has been talking about how viral load affects mitochondira  4) Insomnia: -magnesium glycinate helps  5) History of hypertensive headache: -had a CT 2 years ago and was found to have white matter lesions -Neurology did MRIs recently to  assess for MS -she was found to have multiple white matter lesions -there was acute and chronic inflammation in the spinal fluid     Pain Inventory Average Pain 4 Pain Right Now 3 My pain is constant, burning, dull, tingling, and aching  In the last 24 hours, has pain interfered with the following? General activity 3 Relation with others 5 Enjoyment of life 5 What TIME of day is your pain at its worst? evening and night Sleep (in general) Fair  Pain is worse with: unsure, stress Pain improves with: heat/ice(acupuncture) Relief from Meds: 4      Family History  Problem Relation Age of Onset   Heart disease Brother        sudden cardiac death   Asthma Brother    Early death Brother    Heart disease Maternal Grandfather 49       MI   Coronary artery disease Maternal Grandfather    Diabetes Maternal Grandfather    Kidney disease Maternal Grandfather    Heart disease Mother    Depression Mother    Diabetes Mother    Alcohol abuse Paternal Grandfather    Cancer Paternal Grandfather    Cancer Paternal Grandmother    Stroke Neg Hx    Hypertension Neg Hx    Social History   Socioeconomic History   Marital status: Married    Spouse name: Not on file   Number of children: 2   Years of education: 18   Highest education level: Master's degree (e.g., MA, MS, MEng, MEd, MSW, MBA)  Occupational  History   Occupation: clinical Hospital doctor: SELF-EMPLOYED  Tobacco Use   Smoking status: Never   Smokeless tobacco: Never  Vaping Use   Vaping status: Never Used  Substance and Sexual Activity   Alcohol use: No   Drug use: No   Sexual activity: Yes    Birth control/protection: I.U.D.  Other Topics Concern   Not on file  Social History Narrative   1 daughter--Kelsey [name] born 12/09, 1 step son   Right handed   Works in social work   Occasionally caffeine   2 Administrator degree   Social Drivers of Corporate investment banker Strain: Low Risk   (02/02/2024)   Overall Financial Resource Strain (CARDIA)    Difficulty of Paying Living Expenses: Not very hard  Food Insecurity: No Food Insecurity (02/02/2024)   Hunger Vital Sign    Worried About Running Out of Food in the Last Year: Never true    Ran Out of Food in the Last Year: Never true  Transportation Needs: No Transportation Needs (02/02/2024)   PRAPARE - Administrator, Civil Service (Medical): No    Lack of Transportation (Non-Medical): No  Physical Activity: Insufficiently Active (02/02/2024)   Exercise Vital Sign    Days of Exercise per Week: 2 days    Minutes of Exercise per Session: 30 min  Stress: No Stress Concern Present (02/02/2024)   [employer] of Occupational Health - Occupational Stress Questionnaire    Feeling of Stress: Only a little  Social Connections: Socially Integrated (02/02/2024)   Social Connection and Isolation Panel    Frequency of Communication with Friends and Family: More than three times a week    Frequency of Social Gatherings with Friends and Family: More than three times a week    Attends Religious Services: More than 4 times per year    Active Member of Clubs or Organizations: Yes    Attends Banker Meetings: More than 4 times per year    Marital Status: Married   Past Surgical History:  Procedure Laterality Date   CESAREAN SECTION     CESAREAN SECTION  08/31/2011   Procedure: CESAREAN SECTION;  Surgeon: Burnard A. Kandyce, MD;  Location: [facility];  Service: Gynecology;  Laterality: [name];  Repeat cesarean section with delivery of baby    COLPOSCOPY     KNEE ARTHROSCOPY     right knee   WISDOM TOOTH EXTRACTION     Past Medical History:  Diagnosis Date   Abnormal Pap smear    Anemia    Asthma 1996   Bipolar disorder (HCC)    Chronic sinusitis    GERD (gastroesophageal reflux disease) 2009   Headache(784.0)    Hypertension 2021   Pyelonephritis 2008   Pylo and stones in the past   Sinus problem    sinusitis    Thyroid  disease 2017   There were no vitals taken for this visit.  Opioid Risk Score:   Fall Risk Score:  `1  Depression screen [facility] 2/9     02/28/2023    9:56 AM 11/08/2022   10:46 AM 09/27/2022   10:11 AM 07/26/2022   10:54 AM 11/15/2021    4:00 PM 10/19/2020    7:59 AM 06/19/2020   12:32 PM  Depression screen PHQ 2/9  Decreased Interest 0 0 0 0 0 0 0  Down, Depressed, Hopeless 0 0 0 0 0 0 0  PHQ - 2 Score 0  0 0 0 0 0 0  Altered sleeping    1  0 0  Tired, decreased energy    1  0 0  Change in appetite    1  0 0  Feeling bad or failure about yourself     0  0 0  Trouble concentrating    1  0 0  Moving slowly or fidgety/restless    0  0 0  Suicidal thoughts    0  0 0  PHQ-9 Score    4  0 0  Difficult doing work/chores    Somewhat difficult  Not difficult at all Not difficult at all     Review of Systems  Constitutional: Negative.   HENT: Negative.    Eyes: Negative.   Respiratory: Negative.    Cardiovascular: Negative.   Endocrine: Negative.   Genitourinary: Negative.   Musculoskeletal:  Positive for neck pain.       Pain joints for elbows, knees, hands   Skin: Negative.   Allergic/Immunologic: Negative.   Neurological: Negative.   Hematological: Negative.   Psychiatric/Behavioral: Negative.    All other systems reviewed and are negative.      Objective:   Physical Exam Gen: no distress, normal appearing HEENT: oral mucosa pink and moist, NCAT Cardio: Reg rate Chest: normal effort, normal rate of breathing Abd: soft, non-distended Ext: no edema Psych: pleasant, normal affect Skin: intact Neuro: Alert and oriented x3     Assessment & Plan:  1) Chronic Pain Syndrome with diffuse joint pain/fibromyalgia/LONG COVID -discussed trying to wean off the low dose naltrexone, discussed plan to stop and resume if needed -recommended using her hot tub, discussed benefits of sauna -discussed that fluctuating weakness started last summer -discussed how MS is  diagnosed, discussed that she has plaques in her brain and she was told that she may be in the beginning phases of possible MS, discussed that she also has tremors in her hands, a vibrating sensation in her groin, and ears, that she has consistent tinnitus, and the neuropathic pain -Discussed current symptoms of pain and history of pain.  -discussed that she was sceptical of restarting low dose naltrexone from [facility] in [place]- discussed that they give a tablet form that does not cause as much nausea, discussed that she is now at 4.5mg  and she does find this helpful -discussed her history of LONG COVID in 2021 and EBV and Lyme positive markers -discussed that she followed with the LONG COVID clinic -discussed her history of being on antibiotics -discussed following with GI -discussed eliminating things that are unnecessary in her life -d/c low dose naltrexone since was not effective enough -continue cymbalta 20mg  daily -discussed the Book FibroFix -discussed getting a CBC with differentials, checking iron panel -discussed benefit from heat -called [facility] pharmacy to check on LDN script, will increased dose to 2mg  HS -prescribed vitamin D  -discussed that she smells gasoline all the time.  -discussed methylene blue -discussed red light therapy -discussed that valcyclovir seemed to help, change back to prn in case she is developing tolerance -Discussed benefits of exercise in reducing pain. -discussed mechanism of action of low dose naltrexone as an opioid receptor antagonist which stimulates your body's production of its own natural endogenous opioids, helping to decrease pain. Discussed that it can also decrease T cell response and thus be helpful in decreasing inflammation, and symptoms of brain fog, fatigue, anxiety, depression, and allergies. Discussed that this medication needs to be compounded at a  compounding pharmacy and can more expensive. Discussed that I usually start at  1mg  and if this is not providing enough relief then I titrate upward on a monthly basis.    -Discussed following foods that may reduce pain: 1) Ginger (especially studied for arthritis)- reduce leukotriene production to decrease inflammation 2) Blueberries- high in phytonutrients that decrease inflammation 3) Salmon- marine omega-3s reduce joint swelling and pain 4) Pumpkin seeds- reduce inflammation 5) dark chocolate- reduces inflammation 6) turmeric- reduces inflammation 7) tart cherries - reduce pain and stiffness 8) extra virgin olive oil - its compound olecanthal helps to block prostaglandins  9) chili peppers- can be eaten or applied topically via capsaicin 10) mint- helpful for headache, muscle aches, joint pain, and itching 11) garlic- reduces inflammation  Link to further information on diet for chronic pain: [email or web address]   2) Fatigue -recommended liposomal glutathione and NAC -discussed that she followed with the COVID clinic at [facility], discussed that her provider recommended Plaquenil and getting small fiber nerve testing, discussed that gabapentin was prescribed for pain but this worsens her fatigue -discussed her prior sleep studies -discussed red light therapy -refilled modafinil  -recommended sauna use, recommended hot tub use -discussed   3) IBS -discussed her food allergy  testing and that she does not often eat salmon, dairy, shrimp, cashews -discussed that new food allergy  testing shows reactivity to pork, cheese, wheat, casein -discussed that she has cut out gluten -discussed that this feels better -discussed that she feels the probiotic is helpful -recommended pendulum GLP-1  4) Chronic sinus infections  5) Suboptimal vitamin D : -sent Vitamin D  ergocalciferol  50,000U once per week for 7 weeks.   6)Anxiety: -Discussed exercise and meditation as tools to decrease anxiety. -Recommended  Down Dog Yoga app -Discussed spending time outdoors. -Discussed positive re-framing of anxiety.  -Discussed the following foods that have been show to reduce anxiety: 1) [place] nuts, mushrooms, soy beans due to their high selenium content. Upper limit of toxicity of selenium is 464mcg/day so no more than 3-4 [place] nuts per day.  2) Fatty fish such as salmon, mackerel, sardines, trout, and herring- high in omega-3 fatty acids 3) Eggs- increases serotonin and dopamine 4) Pumpkin seeds- high in omega-3 fatty acids 5) dark chocolate- high in flavanols that increase blood flow to brain 6) turmeric- take with black pepper to increase absorption 7) chamomile tea- antioxidant and anti-inflammatory properties 8) yogurt without sugar- supports gut-brain axis 9) green tea- contains L- theanine 10) blueberries- high in vitamin C and antioxidants 11) [place]- high in tryptophan which gets converted to serotonin 12) bell peppers- rich in vitamin C and antioxidants 13) citrus fruits- rich in vitamin C and antioxidants 14) almonds- high in vitamin E and healthy fats 15) chia seeds- high in omega-3 fatty acids   7) Mouth ulcers -continue acyclovir, change back to prn -she asks if this is an indication of viral load.  -trial lysine 500mg  to 3,000mg , discussed that herpes virus chokes on lysine  -Provided with a pain relief journal and discussed that it contains foods and lifestyle tips to naturally help to improve pain. Discussed that these lifestyle strategies are also very good for health unlike some medications which can have negative side effects. Discussed that the act of keeping a journal can be therapeutic and helpful to realize patterns what helps to trigger and alleviate pain.    8) Insomnia:  -continue magnesium supplement and tart cherry juice.   9) Elevated LDL -reviewed LDL and the level is 107 Provided with  list of supplements that can help with dyslipidemia: 1) Vitamin B3 500-4,000mg   in divided doses daily (would recommend starting low as can cause uncomfortable facial flushing if started at too high a dose) 2) Phytosterols 2.15 grams daily 3) Fermented soy 30-50 grams daily 4) EGCG (found in green tea): 500-1000mg  daily 5) Omega-3 fatty acids 3000-5,000mg  daily 6) Flax seed 40 grams daily 7) Monounsaturated fats 20-40 grams daily (olives, olive oil, nuts), also reduces cardiovascular disease 8) Sesame: 40 grams daily 9) Gamma/delta tocotrienols- a family of unsaturated forms of Vitamin E- 200mg  with dinner 10) Pantethine 900mg  daily in divided doses 11) Resveratrol 250mg  daily 12) N Acetyl Cysteine 2000mg  daily in divided doses 13) Curcumin 2000-5000mg  in divided doses daily 14) Pomegranate juice: 8 ounces daily, also helps to lower blood pressure 15) Pomegranate seeds one cup daily, also helps to lower blood pressure 16) Citrus Bergamot 1000mg  daily, also helps with glucose control and weight loss 17) Vitamin C 500mg  daily 18) Quercetin 500-1000mg  daily 19) Glutathione 20) Probiotics 60-100 billion organisms per day 21) Fiber 22) Oats 23) Aged garlic (can eat as food or supplement of 600-900mg  per day) 24) Chia seeds 25 grams per day 25) Lycopene- carotenoid found in high concentrations in tomatoes. 26) Alpha linolenic acid 27) Flavonoids and anthocyanins 28) Wogonin- flavanoid that enhances reverse cholesterol transport 29) Coenzyme Q10 30) Pantethine- derivative of Vitamin B5: 300mg  three times per day or 450mg  twice per day with or without food 31) Barley and other whole grains 32) Orange juice 33) L- carnitine 34) L- Lysine 35) L- Arginine 36) Almonds 37) Morin 38) Rutin 39) Carnosine 40) Histidine  41) Kaempferol  42) Organosulfur compounds 43) Vitamin E 44) Oleic acid 45) RBO (ferulic acid gammaoryzanol) 46) grape seed extract 47) Red wine 48) Berberine HCL 500mg  daily or twice per day- more effective and with fewer adverse effects that  ezetimibe monotherapy 49) red yeast rice 2400- 4800 mg/day 50) chlorella 51) Licorice   10) HTN:  -she asks whether this may be contributed to headache -continue valsartan  and coreg  -discussed that sterss is improved -discussed that on days that she is more hurried she is more stressed  11) Anxiety:  -dicussed that she is doing well in this regard  12) Neuropathic pain: -continue tens unit -discussed this can be from cervical spinal nerve compression -discussed increasing topamax  to BID -discussed PT  13) Visual deficits: -discussed that sometimes she has blurry vision  14)  IBS-C: -discussed that she was diagnosed by GI -discussed that she is having a colonoscopy and endoscopy -recommended eating prunes

## 2024-03-05 ENCOUNTER — Encounter: Payer: Self-pay | Attending: Physical Medicine and Rehabilitation | Admitting: Physical Medicine and Rehabilitation

## 2024-03-05 VITALS — BP 127/87 | HR 98 | Ht 62.0 in | Wt 141.0 lb

## 2024-03-05 DIAGNOSIS — R5383 Other fatigue: Secondary | ICD-10-CM | POA: Insufficient documentation

## 2024-03-05 DIAGNOSIS — H547 Unspecified visual loss: Secondary | ICD-10-CM | POA: Diagnosis not present

## 2024-03-05 DIAGNOSIS — K121 Other forms of stomatitis: Secondary | ICD-10-CM

## 2024-03-05 DIAGNOSIS — K581 Irritable bowel syndrome with constipation: Secondary | ICD-10-CM | POA: Insufficient documentation

## 2024-03-05 DIAGNOSIS — G894 Chronic pain syndrome: Secondary | ICD-10-CM | POA: Insufficient documentation

## 2024-03-05 MED ORDER — VALACYCLOVIR HCL 500 MG PO TABS: 500.0000 mg | ORAL_TABLET | Freq: Every day | ORAL | 3 refills | Status: AC | PRN

## 2024-03-05 MED ORDER — MODAFINIL 200 MG PO TABS: 200.0000 mg | ORAL_TABLET | Freq: Every day | ORAL | 3 refills | Status: AC

## 2024-03-05 NOTE — Patient Instructions (Addendum)
 Good supplement brands: Vimergy Pure Encapsulations  Liposomal glutathione, NAC Eyebright  Avoid gluten/dairy/eggs Eat a lot fruits/vegetables  Balneotherapy, Zackary [place]

## 2024-03-09 ENCOUNTER — Encounter: Payer: Self-pay | Admitting: Sports Medicine

## 2024-04-07 LAB — STREP PNEUMONIAE 23 SEROTYPES IGG
Pneumo Ab Type 1*: 0.2 ug/mL — ABNORMAL LOW (ref >1.3)
Pneumo Ab Type 12 (12F)*: <0.1 ug/mL — ABNORMAL LOW (ref >1.3)
Pneumo Ab Type 14*: 3.6 ug/mL (ref >1.3)
Pneumo Ab Type 17 (17F)*: 2 ug/mL (ref >1.3)
Pneumo Ab Type 19 (19F)*: 0.6 ug/mL — ABNORMAL LOW (ref >1.3)
Pneumo Ab Type 2*: 0.3 ug/mL — ABNORMAL LOW (ref >1.3)
Pneumo Ab Type 20*: 0.9 ug/mL — ABNORMAL LOW (ref >1.3)
Pneumo Ab Type 22 (22F)*: 1.1 ug/mL — ABNORMAL LOW (ref >1.3)
Pneumo Ab Type 23 (23F)*: 1 ug/mL — ABNORMAL LOW (ref >1.3)
Pneumo Ab Type 26 (6B)*: 0.1 ug/mL — ABNORMAL LOW (ref >1.3)
Pneumo Ab Type 3*: <0.1 ug/mL — ABNORMAL LOW (ref >1.3)
Pneumo Ab Type 34 (10A)*: 1.1 ug/mL — ABNORMAL LOW (ref >1.3)
Pneumo Ab Type 4*: 0.5 ug/mL — ABNORMAL LOW (ref >1.3)
Pneumo Ab Type 43 (11A)*: 1.2 ug/mL — ABNORMAL LOW (ref >1.3)
Pneumo Ab Type 5*: 1.5 ug/mL (ref >1.3)
Pneumo Ab Type 51 (7F)*: 0.1 ug/mL — ABNORMAL LOW (ref >1.3)
Pneumo Ab Type 54 (15B)*: 0.7 ug/mL — ABNORMAL LOW (ref >1.3)
Pneumo Ab Type 56 (18C)*: 0.8 ug/mL — ABNORMAL LOW (ref >1.3)
Pneumo Ab Type 57 (19A)*: 2.6 ug/mL (ref >1.3)
Pneumo Ab Type 68 (9V)*: 0.3 ug/mL — ABNORMAL LOW (ref >1.3)
Pneumo Ab Type 70 (33F)*: 2.7 ug/mL (ref >1.3)
Pneumo Ab Type 8*: 8.6 ug/mL (ref >1.3)
Pneumo Ab Type 9 (9N)*: 0.3 ug/mL — ABNORMAL LOW (ref >1.3)

## 2024-04-07 LAB — IGG: IgG (Immunoglobin G), Serum: 1041 mg/dL (ref 586–1602)

## 2024-04-07 LAB — DIPHTHERIA / TETANUS ANTIBODY PANEL
Diphtheria Ab: 0.49 [IU]/mL (ref <0.10)
Tetanus Ab, IgG: 0.45 [IU]/mL (ref <0.10)

## 2024-04-07 LAB — ALPHA-GAL PANEL
Allergen Lamb IgE: <0.1 kU/L
Beef IgE: <0.1 kU/L
IgE (Immunoglobulin E), Serum: 9 [IU]/mL (ref 6–495)
O215-IgE Alpha-Gal: <0.1 kU/L
Pork IgE: <0.1 kU/L

## 2024-04-07 LAB — COMPLEMENT, TOTAL: Compl, Total (CH50): >60 U/mL (ref >41)

## 2024-04-07 LAB — TRYPTASE: Tryptase: 4 ug/L (ref 2.2–13.2)

## 2024-04-08 ENCOUNTER — Ambulatory Visit: Payer: Self-pay | Admitting: Allergy

## 2024-05-13 ENCOUNTER — Encounter: Payer: Self-pay | Admitting: Obstetrics and Gynecology

## 2024-05-13 DIAGNOSIS — N951 Menopausal and female climacteric states: Secondary | ICD-10-CM

## 2024-05-15 MED ORDER — ESTRADIOL 1 MG PO TABS: 1.0000 mg | ORAL_TABLET | Freq: Every day | ORAL | 3 refills | Status: DC

## 2024-05-15 NOTE — Addendum Note (Signed)
 Addended by: CLEATUS MOCCASIN A on: 05/15/2024 11:01 AM   Modules accepted: Orders

## 2024-05-17 MED ORDER — ESTRADIOL 2 MG PO TABS: 2.0000 mg | ORAL_TABLET | Freq: Every day | ORAL | 3 refills | Status: AC

## 2024-05-17 NOTE — Addendum Note (Signed)
 Addended by: CLEATUS MOCCASIN A on: 05/17/2024 02:41 PM   Modules accepted: Orders

## 2024-05-26 ENCOUNTER — Ambulatory Visit: Admitting: Urgent Care

## 2024-07-12 DIAGNOSIS — Z1231 Encounter for screening mammogram for malignant neoplasm of breast: Secondary | ICD-10-CM

## 2024-08-27 ENCOUNTER — Ambulatory Visit: Admitting: Physical Medicine and Rehabilitation

## 2024-08-27 ENCOUNTER — Ambulatory Visit
